# Patient Record
Sex: Female | Born: 1945 | Race: White | Hispanic: No | Marital: Single | Smoking: Current some day smoker
Health system: Southern US, Community
[De-identification: ages and names within clinical notes are randomized; demographics above are authoritative.]

## PROBLEM LIST (undated history)

## (undated) DIAGNOSIS — T4145XA Adverse effect of unspecified anesthetic, initial encounter: Secondary | ICD-10-CM

## (undated) DIAGNOSIS — J189 Pneumonia, unspecified organism: Secondary | ICD-10-CM

## (undated) DIAGNOSIS — M5416 Radiculopathy, lumbar region: Secondary | ICD-10-CM

## (undated) DIAGNOSIS — Z972 Presence of dental prosthetic device (complete) (partial): Secondary | ICD-10-CM

## (undated) DIAGNOSIS — K5190 Ulcerative colitis, unspecified, without complications: Secondary | ICD-10-CM

## (undated) DIAGNOSIS — R42 Dizziness and giddiness: Secondary | ICD-10-CM

## (undated) DIAGNOSIS — F419 Anxiety disorder, unspecified: Secondary | ICD-10-CM

## (undated) DIAGNOSIS — R7303 Prediabetes: Secondary | ICD-10-CM

## (undated) DIAGNOSIS — E871 Hypo-osmolality and hyponatremia: Secondary | ICD-10-CM

## (undated) DIAGNOSIS — M8580 Other specified disorders of bone density and structure, unspecified site: Secondary | ICD-10-CM

## (undated) DIAGNOSIS — M1990 Unspecified osteoarthritis, unspecified site: Secondary | ICD-10-CM

## (undated) DIAGNOSIS — I10 Essential (primary) hypertension: Secondary | ICD-10-CM

## (undated) DIAGNOSIS — I714 Abdominal aortic aneurysm, without rupture, unspecified: Secondary | ICD-10-CM

## (undated) DIAGNOSIS — T7840XA Allergy, unspecified, initial encounter: Secondary | ICD-10-CM

## (undated) DIAGNOSIS — F329 Major depressive disorder, single episode, unspecified: Secondary | ICD-10-CM

## (undated) DIAGNOSIS — F32A Depression, unspecified: Secondary | ICD-10-CM

## (undated) DIAGNOSIS — H269 Unspecified cataract: Secondary | ICD-10-CM

## (undated) DIAGNOSIS — E876 Hypokalemia: Secondary | ICD-10-CM

## (undated) DIAGNOSIS — T8859XA Other complications of anesthesia, initial encounter: Secondary | ICD-10-CM

## (undated) DIAGNOSIS — K52832 Lymphocytic colitis: Secondary | ICD-10-CM

## (undated) DIAGNOSIS — K259 Gastric ulcer, unspecified as acute or chronic, without hemorrhage or perforation: Secondary | ICD-10-CM

## (undated) DIAGNOSIS — G5700 Lesion of sciatic nerve, unspecified lower limb: Secondary | ICD-10-CM

## (undated) DIAGNOSIS — K279 Peptic ulcer, site unspecified, unspecified as acute or chronic, without hemorrhage or perforation: Secondary | ICD-10-CM

## (undated) HISTORY — PX: ABDOMINAL HYSTERECTOMY: SHX81

## (undated) HISTORY — DX: Major depressive disorder, single episode, unspecified: F32.9

## (undated) HISTORY — DX: Depression, unspecified: F32.A

## (undated) HISTORY — DX: Ulcerative colitis, unspecified, without complications: K51.90

## (undated) HISTORY — DX: Essential (primary) hypertension: I10

## (undated) HISTORY — DX: Gastric ulcer, unspecified as acute or chronic, without hemorrhage or perforation: K25.9

## (undated) HISTORY — DX: Unspecified osteoarthritis, unspecified site: M19.90

## (undated) HISTORY — DX: Unspecified cataract: H26.9

## (undated) HISTORY — DX: Other specified disorders of bone density and structure, unspecified site: M85.80

## (undated) HISTORY — PX: APPENDECTOMY: SHX54

## (undated) HISTORY — PX: BACK SURGERY: SHX140

## (undated) HISTORY — DX: Allergy, unspecified, initial encounter: T78.40XA

---

## 2003-10-28 HISTORY — PX: CATARACT EXTRACTION, BILATERAL: SHX1313

## 2004-01-15 ENCOUNTER — Other Ambulatory Visit: Payer: Self-pay

## 2006-11-11 ENCOUNTER — Ambulatory Visit: Payer: Self-pay

## 2010-04-08 ENCOUNTER — Ambulatory Visit: Payer: Self-pay | Admitting: Family Medicine

## 2010-04-11 ENCOUNTER — Ambulatory Visit: Payer: Self-pay | Admitting: Family Medicine

## 2010-04-17 ENCOUNTER — Ambulatory Visit: Payer: Self-pay

## 2010-05-16 LAB — PATHOLOGY REPORT

## 2010-10-27 DIAGNOSIS — K259 Gastric ulcer, unspecified as acute or chronic, without hemorrhage or perforation: Secondary | ICD-10-CM

## 2010-10-27 HISTORY — PX: BREAST BIOPSY: SHX20

## 2010-10-27 HISTORY — PX: COLONOSCOPY: SHX174

## 2010-10-27 HISTORY — DX: Gastric ulcer, unspecified as acute or chronic, without hemorrhage or perforation: K25.9

## 2010-10-29 ENCOUNTER — Ambulatory Visit: Payer: Self-pay

## 2010-11-22 ENCOUNTER — Inpatient Hospital Stay: Payer: Self-pay | Admitting: Internal Medicine

## 2011-07-29 ENCOUNTER — Ambulatory Visit: Payer: Self-pay | Admitting: Family Medicine

## 2012-04-08 ENCOUNTER — Ambulatory Visit: Payer: Self-pay | Admitting: Rheumatology

## 2012-08-02 ENCOUNTER — Ambulatory Visit: Payer: Self-pay | Admitting: Family Medicine

## 2013-03-22 ENCOUNTER — Ambulatory Visit: Payer: Self-pay | Admitting: Family Medicine

## 2013-04-28 ENCOUNTER — Ambulatory Visit: Payer: Self-pay | Admitting: Family Medicine

## 2013-08-02 ENCOUNTER — Ambulatory Visit: Payer: Self-pay | Admitting: Family Medicine

## 2013-08-08 ENCOUNTER — Ambulatory Visit: Payer: Self-pay | Admitting: Family Medicine

## 2014-03-23 DIAGNOSIS — M79673 Pain in unspecified foot: Secondary | ICD-10-CM | POA: Insufficient documentation

## 2014-03-23 DIAGNOSIS — L259 Unspecified contact dermatitis, unspecified cause: Secondary | ICD-10-CM | POA: Insufficient documentation

## 2014-03-23 DIAGNOSIS — M84376A Stress fracture, unspecified foot, initial encounter for fracture: Secondary | ICD-10-CM | POA: Insufficient documentation

## 2014-07-27 ENCOUNTER — Ambulatory Visit: Payer: Self-pay | Admitting: Family Medicine

## 2014-08-10 ENCOUNTER — Ambulatory Visit: Payer: Self-pay | Admitting: Family Medicine

## 2015-03-21 ENCOUNTER — Ambulatory Visit: Payer: Medicare Other | Attending: Pain Medicine | Admitting: Pain Medicine

## 2015-03-21 ENCOUNTER — Encounter: Payer: Self-pay | Admitting: Pain Medicine

## 2015-03-21 ENCOUNTER — Encounter (INDEPENDENT_AMBULATORY_CARE_PROVIDER_SITE_OTHER): Payer: Self-pay

## 2015-03-21 ENCOUNTER — Other Ambulatory Visit: Payer: Self-pay | Admitting: Pain Medicine

## 2015-03-21 VITALS — BP 140/73 | HR 72 | Temp 97.6°F | Resp 12 | Ht 60.0 in | Wt 134.0 lb

## 2015-03-21 DIAGNOSIS — K219 Gastro-esophageal reflux disease without esophagitis: Secondary | ICD-10-CM | POA: Insufficient documentation

## 2015-03-21 DIAGNOSIS — M51369 Other intervertebral disc degeneration, lumbar region without mention of lumbar back pain or lower extremity pain: Secondary | ICD-10-CM

## 2015-03-21 DIAGNOSIS — M5136 Other intervertebral disc degeneration, lumbar region: Secondary | ICD-10-CM | POA: Diagnosis not present

## 2015-03-21 DIAGNOSIS — M4806 Spinal stenosis, lumbar region: Secondary | ICD-10-CM | POA: Insufficient documentation

## 2015-03-21 DIAGNOSIS — M5416 Radiculopathy, lumbar region: Secondary | ICD-10-CM

## 2015-03-21 DIAGNOSIS — F418 Other specified anxiety disorders: Secondary | ICD-10-CM | POA: Diagnosis not present

## 2015-03-21 DIAGNOSIS — M5126 Other intervertebral disc displacement, lumbar region: Secondary | ICD-10-CM | POA: Diagnosis not present

## 2015-03-21 DIAGNOSIS — K8020 Calculus of gallbladder without cholecystitis without obstruction: Secondary | ICD-10-CM | POA: Insufficient documentation

## 2015-03-21 DIAGNOSIS — M545 Low back pain: Secondary | ICD-10-CM | POA: Diagnosis present

## 2015-03-21 DIAGNOSIS — M533 Sacrococcygeal disorders, not elsewhere classified: Secondary | ICD-10-CM

## 2015-03-21 DIAGNOSIS — M79605 Pain in left leg: Secondary | ICD-10-CM | POA: Diagnosis present

## 2015-03-21 DIAGNOSIS — I10 Essential (primary) hypertension: Secondary | ICD-10-CM | POA: Diagnosis not present

## 2015-03-21 DIAGNOSIS — M47816 Spondylosis without myelopathy or radiculopathy, lumbar region: Secondary | ICD-10-CM

## 2015-03-21 DIAGNOSIS — M79604 Pain in right leg: Secondary | ICD-10-CM | POA: Diagnosis present

## 2015-03-21 NOTE — Patient Instructions (Addendum)
Continue present medications.  Lumbar epidural steroid injection on Monday, 04/02/2015  F/U PCP for evaliation of  BP and general medical  condition. Please inform Dr. Posey Pronto of our plan to perform lumbar epidural steroid injection and that we will be requesting medical clearance from her for you to have lumbar epidural steroid injection  F/U surgical evaluation.  F/U neurological evaluation.  May consider radiofrequency rhizolysis or intraspinal procedures pending response to present treatment and F/U evaluation.  Patient to call Pain Management Center should patient have concerns prior to scheduled return appointment. GENERAL RISKS AND COMPLICATIONS  What are the risk, side effects and possible complications? Generally speaking, most procedures are safe.  However, with any procedure there are risks, side effects, and the possibility of complications.  The risks and complications are dependent upon the sites that are lesioned, or the type of nerve block to be performed.  The closer the procedure is to the spine, the more serious the risks are.  Great care is taken when placing the radio frequency needles, block needles or lesioning probes, but sometimes complications can occur. 1. Infection: Any time there is an injection through the skin, there is a risk of infection.  This is why sterile conditions are used for these blocks.  There are four possible types of infection. 1. Localized skin infection. 2. Central Nervous System Infection-This can be in the form of Meningitis, which can be deadly. 3. Epidural Infections-This can be in the form of an epidural abscess, which can cause pressure inside of the spine, causing compression of the spinal cord with subsequent paralysis. This would require an emergency surgery to decompress, and there are no guarantees that the patient would recover from the paralysis. 4. Discitis-This is an infection of the intervertebral discs.  It occurs in about 1% of  discography procedures.  It is difficult to treat and it may lead to surgery.        2. Pain: the needles have to go through skin and soft tissues, will cause soreness.       3. Damage to internal structures:  The nerves to be lesioned may be near blood vessels or    other nerves which can be potentially damaged.       4. Bleeding: Bleeding is more common if the patient is taking blood thinners such as  aspirin, Coumadin, Ticiid, Plavix, etc., or if he/she have some genetic predisposition  such as hemophilia. Bleeding into the spinal canal can cause compression of the spinal  cord with subsequent paralysis.  This would require an emergency surgery to  decompress and there are no guarantees that the patient would recover from the  paralysis.       5. Pneumothorax:  Puncturing of a lung is a possibility, every time a needle is introduced in  the area of the chest or upper back.  Pneumothorax refers to free air around the  collapsed lung(s), inside of the thoracic cavity (chest cavity).  Another two possible  complications related to a similar event would include: Hemothorax and Chylothorax.   These are variations of the Pneumothorax, where instead of air around the collapsed  lung(s), you may have blood or chyle, respectively.       6. Spinal headaches: They may occur with any procedures in the area of the spine.       7. Persistent CSF (Cerebro-Spinal Fluid) leakage: This is a rare problem, but may occur  with prolonged intrathecal or epidural catheters either due to the  formation of a fistulous  track or a dural tear.       8. Nerve damage: By working so close to the spinal cord, there is always a possibility of  nerve damage, which could be as serious as a permanent spinal cord injury with  paralysis.       9. Death:  Although rare, severe deadly allergic reactions known as "Anaphylactic  reaction" can occur to any of the medications used.      10. Worsening of the symptoms:  We can always make thing  worse.  What are the chances of something like this happening? Chances of any of this occuring are extremely low.  By statistics, you have more of a chance of getting killed in a motor vehicle accident: while driving to the hospital than any of the above occurring .  Nevertheless, you should be aware that they are possibilities.  In general, it is similar to taking a shower.  Everybody knows that you can slip, hit your head and get killed.  Does that mean that you should not shower again?  Nevertheless always keep in mind that statistics do not mean anything if you happen to be on the wrong side of them.  Even if a procedure has a 1 (one) in a 1,000,000 (million) chance of going wrong, it you happen to be that one..Also, keep in mind that by statistics, you have more of a chance of having something go wrong when taking medications.  Who should not have this procedure? If you are on a blood thinning medication (e.g. Coumadin, Plavix, see list of "Blood Thinners"), or if you have an active infection going on, you should not have the procedure.  If you are taking any blood thinners, please inform your physician.  How should I prepare for this procedure?  Do not eat or drink anything at least six hours prior to the procedure.  Bring a driver with you .  It cannot be a taxi.  Come accompanied by an adult that can drive you back, and that is strong enough to help you if your legs get weak or numb from the local anesthetic.  Take all of your medicines the morning of the procedure with just enough water to swallow them.  If you have diabetes, make sure that you are scheduled to have your procedure done first thing in the morning, whenever possible.  If you have diabetes, take only half of your insulin dose and notify our nurse that you have done so as soon as you arrive at the clinic.  If you are diabetic, but only take blood sugar pills (oral hypoglycemic), then do not take them on the morning of your  procedure.  You may take them after you have had the procedure.  Do not take aspirin or any aspirin-containing medications, at least eleven (11) days prior to the procedure.  They may prolong bleeding.  Wear loose fitting clothing that may be easy to take off and that you would not mind if it got stained with Betadine or blood.  Do not wear any jewelry or perfume  Remove any nail coloring.  It will interfere with some of our monitoring equipment.  NOTE: Remember that this is not meant to be interpreted as a complete list of all possible complications.  Unforeseen problems may occur.  BLOOD THINNERS The following drugs contain aspirin or other products, which can cause increased bleeding during surgery and should not be taken for 2 weeks prior to  and 1 week after surgery.  If you should need take something for relief of minor pain, you may take acetaminophen which is found in Tylenol,m Datril, Anacin-3 and Panadol. It is not blood thinner. The products listed below are.  Do not take any of the products listed below in addition to any listed on your instruction sheet.  A.P.C or A.P.C with Codeine Codeine Phosphate Capsules #3 Ibuprofen Ridaura  ABC compound Congesprin Imuran rimadil  Advil Cope Indocin Robaxisal  Alka-Seltzer Effervescent Pain Reliever and Antacid Coricidin or Coricidin-D  Indomethacin Rufen  Alka-Seltzer plus Cold Medicine Cosprin Ketoprofen S-A-C Tablets  Anacin Analgesic Tablets or Capsules Coumadin Korlgesic Salflex  Anacin Extra Strength Analgesic tablets or capsules CP-2 Tablets Lanoril Salicylate  Anaprox Cuprimine Capsules Levenox Salocol  Anexsia-D Dalteparin Magan Salsalate  Anodynos Darvon compound Magnesium Salicylate Sine-off  Ansaid Dasin Capsules Magsal Sodium Salicylate  Anturane Depen Capsules Marnal Soma  APF Arthritis pain formula Dewitt's Pills Measurin Stanback  Argesic Dia-Gesic Meclofenamic Sulfinpyrazone  Arthritis Bayer Timed Release Aspirin  Diclofenac Meclomen Sulindac  Arthritis pain formula Anacin Dicumarol Medipren Supac  Analgesic (Safety coated) Arthralgen Diffunasal Mefanamic Suprofen  Arthritis Strength Bufferin Dihydrocodeine Mepro Compound Suprol  Arthropan liquid Dopirydamole Methcarbomol with Aspirin Synalgos  ASA tablets/Enseals Disalcid Micrainin Tagament  Ascriptin Doan's Midol Talwin  Ascriptin A/D Dolene Mobidin Tanderil  Ascriptin Extra Strength Dolobid Moblgesic Ticlid  Ascriptin with Codeine Doloprin or Doloprin with Codeine Momentum Tolectin  Asperbuf Duoprin Mono-gesic Trendar  Aspergum Duradyne Motrin or Motrin IB Triminicin  Aspirin plain, buffered or enteric coated Durasal Myochrisine Trigesic  Aspirin Suppositories Easprin Nalfon Trillsate  Aspirin with Codeine Ecotrin Regular or Extra Strength Naprosyn Uracel  Atromid-S Efficin Naproxen Ursinus  Auranofin Capsules Elmiron Neocylate Vanquish  Axotal Emagrin Norgesic Verin  Azathioprine Empirin or Empirin with Codeine Normiflo Vitamin E  Azolid Emprazil Nuprin Voltaren  Bayer Aspirin plain, buffered or children's or timed BC Tablets or powders Encaprin Orgaran Warfarin Sodium  Buff-a-Comp Enoxaparin Orudis Zorpin  Buff-a-Comp with Codeine Equegesic Os-Cal-Gesic   Buffaprin Excedrin plain, buffered or Extra Strength Oxalid   Bufferin Arthritis Strength Feldene Oxphenbutazone   Bufferin plain or Extra Strength Feldene Capsules Oxycodone with Aspirin   Bufferin with Codeine Fenoprofen Fenoprofen Pabalate or Pabalate-SF   Buffets II Flogesic Panagesic   Buffinol plain or Extra Strength Florinal or Florinal with Codeine Panwarfarin   Buf-Tabs Flurbiprofen Penicillamine   Butalbital Compound Four-way cold tablets Penicillin   Butazolidin Fragmin Pepto-Bismol   Carbenicillin Geminisyn Percodan   Carna Arthritis Reliever Geopen Persantine   Carprofen Gold's salt Persistin   Chloramphenicol Goody's Phenylbutazone   Chloromycetin Haltrain Piroxlcam    Clmetidine heparin Plaquenil   Cllnoril Hyco-pap Ponstel   Clofibrate Hydroxy chloroquine Propoxyphen         Before stopping any of these medications, be sure to consult the physician who ordered them.  Some, such as Coumadin (Warfarin) are ordered to prevent or treat serious conditions such as "deep thrombosis", "pumonary embolisms", and other heart problems.  The amount of time that you may need off of the medication may also vary with the medication and the reason for which you were taking it.  If you are taking any of these medications, please make sure you notify your pain physician before you undergo any procedures.         Epidural Steroid Injection Patient Information  Description: The epidural space surrounds the nerves as they exit the spinal cord.  In some patients, the nerves can be compressed  and inflamed by a bulging disc or a tight spinal canal (spinal stenosis).  By injecting steroids into the epidural space, we can bring irritated nerves into direct contact with a potentially helpful medication.  These steroids act directly on the irritated nerves and can reduce swelling and inflammation which often leads to decreased pain.  Epidural steroids may be injected anywhere along the spine and from the neck to the low back depending upon the location of your pain.   After numbing the skin with local anesthetic (like Novocaine), a small needle is passed into the epidural space slowly.  You may experience a sensation of pressure while this is being done.  The entire block usually last less than 10 minutes.  Conditions which may be treated by epidural steroids:   Low back and leg pain  Neck and arm pain  Spinal stenosis  Post-laminectomy syndrome  Herpes zoster (shingles) pain  Pain from compression fractures  Preparation for the injection:  1. Do not eat any solid food or dairy products within 6 hours of your appointment.  2. You may drink clear liquids up to 2 hours  before appointment.  Clear liquids include water, black coffee, juice or soda.  No milk or cream please. 3. You may take your regular medication, including pain medications, with a sip of water before your appointment  Diabetics should hold regular insulin (if taken separately) and take 1/2 normal NPH dos the morning of the procedure.  Carry some sugar containing items with you to your appointment. 4. A driver must accompany you and be prepared to drive you home after your procedure.  5. Bring all your current medications with your. 6. An IV may be inserted and sedation may be given at the discretion of the physician.   7. A blood pressure cuff, EKG and other monitors will often be applied during the procedure.  Some patients may need to have extra oxygen administered for a short period. 8. You will be asked to provide medical information, including your allergies, prior to the procedure.  We must know immediately if you are taking blood thinners (like Coumadin/Warfarin)  Or if you are allergic to IV iodine contrast (dye). We must know if you could possible be pregnant.  Possible side-effects:  Bleeding from needle site  Infection (rare, may require surgery)  Nerve injury (rare)  Numbness & tingling (temporary)  Difficulty urinating (rare, temporary)  Spinal headache ( a headache worse with upright posture)  Light -headedness (temporary)  Pain at injection site (several days)  Decreased blood pressure (temporary)  Weakness in arm/leg (temporary)  Pressure sensation in back/neck (temporary)  Call if you experience:  Fever/chills associated with headache or increased back/neck pain.  Headache worsened by an upright position.  New onset weakness or numbness of an extremity below the injection site  Hives or difficulty breathing (go to the emergency room)  Inflammation or drainage at the infection site  Severe back/neck pain  Any new symptoms which are concerning to  you  Please note:  Although the local anesthetic injected can often make your back or neck feel good for several hours after the injection, the pain will likely return.  It takes 3-7 days for steroids to work in the epidural space.  You may not notice any pain relief for at least that one week.  If effective, we will often do a series of three injections spaced 3-6 weeks apart to maximally decrease your pain.  After the initial series, we generally  will wait several months before considering a repeat injection of the same type.  If you have any questions, please call [phone] [facility]
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
## 2015-03-21 NOTE — Progress Notes (Deleted)
   Subjective:    Patient ID: Cheryl [name], female    DOB: 1946-08-18, 69 y.o.   MRN: [id number]  HPI    Review of Systems     Objective:   Physical Exam        Assessment & Plan:

## 2015-03-21 NOTE — Progress Notes (Signed)
Discharged at 1400, ambulatory

## 2015-03-27 DIAGNOSIS — K52832 Lymphocytic colitis: Secondary | ICD-10-CM | POA: Insufficient documentation

## 2015-03-29 ENCOUNTER — Other Ambulatory Visit: Payer: Self-pay | Admitting: Pain Medicine

## 2015-03-30 ENCOUNTER — Other Ambulatory Visit: Payer: Self-pay | Admitting: Pain Medicine

## 2015-03-30 DIAGNOSIS — M5136 Other intervertebral disc degeneration, lumbar region: Secondary | ICD-10-CM

## 2015-03-30 DIAGNOSIS — M47816 Spondylosis without myelopathy or radiculopathy, lumbar region: Secondary | ICD-10-CM | POA: Insufficient documentation

## 2015-03-30 DIAGNOSIS — M5416 Radiculopathy, lumbar region: Secondary | ICD-10-CM

## 2015-03-30 DIAGNOSIS — M533 Sacrococcygeal disorders, not elsewhere classified: Secondary | ICD-10-CM

## 2015-04-02 ENCOUNTER — Encounter: Payer: Self-pay | Admitting: Pain Medicine

## 2015-04-02 ENCOUNTER — Ambulatory Visit: Payer: Medicare Other | Attending: Pain Medicine | Admitting: Pain Medicine

## 2015-04-02 VITALS — BP 106/62 | HR 60 | Temp 97.9°F | Resp 16 | Ht 60.0 in | Wt 134.0 lb

## 2015-04-02 DIAGNOSIS — M5126 Other intervertebral disc displacement, lumbar region: Secondary | ICD-10-CM | POA: Diagnosis not present

## 2015-04-02 DIAGNOSIS — K8020 Calculus of gallbladder without cholecystitis without obstruction: Secondary | ICD-10-CM | POA: Insufficient documentation

## 2015-04-02 DIAGNOSIS — M5416 Radiculopathy, lumbar region: Secondary | ICD-10-CM

## 2015-04-02 DIAGNOSIS — M4806 Spinal stenosis, lumbar region: Secondary | ICD-10-CM | POA: Diagnosis not present

## 2015-04-02 DIAGNOSIS — M545 Low back pain: Secondary | ICD-10-CM | POA: Diagnosis present

## 2015-04-02 DIAGNOSIS — M5136 Other intervertebral disc degeneration, lumbar region: Secondary | ICD-10-CM

## 2015-04-02 DIAGNOSIS — M47816 Spondylosis without myelopathy or radiculopathy, lumbar region: Secondary | ICD-10-CM

## 2015-04-02 DIAGNOSIS — M533 Sacrococcygeal disorders, not elsewhere classified: Secondary | ICD-10-CM

## 2015-04-02 DIAGNOSIS — M79605 Pain in left leg: Secondary | ICD-10-CM | POA: Diagnosis present

## 2015-04-02 DIAGNOSIS — M79604 Pain in right leg: Secondary | ICD-10-CM | POA: Diagnosis present

## 2015-04-02 MED ORDER — LIDOCAINE HCL (PF) 1 % IJ SOLN
INTRAMUSCULAR | Status: AC
  Filled 2015-04-02: qty 5

## 2015-04-02 MED ORDER — ORPHENADRINE CITRATE 30 MG/ML IJ SOLN
INTRAMUSCULAR | Status: AC
  Filled 2015-04-02: qty 2

## 2015-04-02 MED ORDER — SODIUM CHLORIDE 0.9 % IJ SOLN
INTRAMUSCULAR | Status: AC
  Administered 2015-04-02: 4 mL
  Filled 2015-04-02: qty 20

## 2015-04-02 MED ORDER — FENTANYL CITRATE (PF) 100 MCG/2ML IJ SOLN
INTRAMUSCULAR | Status: AC
  Administered 2015-04-02: 50 ug via INTRAVENOUS
  Filled 2015-04-02: qty 2

## 2015-04-02 MED ORDER — BUPIVACAINE HCL (PF) 0.25 % IJ SOLN
INTRAMUSCULAR | Status: AC
  Filled 2015-04-02: qty 30

## 2015-04-02 MED ORDER — TRIAMCINOLONE ACETONIDE 40 MG/ML IJ SUSP
INTRAMUSCULAR | Status: AC
  Administered 2015-04-02: 40 mg
  Filled 2015-04-02: qty 1

## 2015-04-02 MED ORDER — MIDAZOLAM HCL 5 MG/5ML IJ SOLN
INTRAMUSCULAR | Status: AC
  Administered 2015-04-02: 1 mg via INTRAVENOUS
  Filled 2015-04-02: qty 5

## 2015-04-02 NOTE — Progress Notes (Signed)
   Subjective:    Patient ID: Cheryl [name], female    DOB: Oct 10, 1946, 69 y.o.   MRN: [id number]  HPI    Review of Systems     Objective:   Physical Exam        Assessment & Plan:

## 2015-04-02 NOTE — Patient Instructions (Addendum)
Continue present medications.  F/U PCP for evaliation of  BP and general medical  condition.  F/U surgical evaluation.  F/U neurological evaluation.  May consider radiofrequency rhizolysis or intraspinal procedures pending response to present treatment and F/U evaluation.  Patient to call Pain Management Center should patient have concerns prior to scheduled return appointment. Pain Management Discharge Instructions  General Discharge Instructions :  If you need to reach your doctor call: Monday-Friday 8:00 am - 4:00 pm at [phone] or toll free [phone].  After clinic hours [phone] to have operator reach doctor.  Bring all of your medication bottles to all your appointments in the pain clinic.  To cancel or reschedule your appointment with Pain Management please remember to call 24 hours in advance to avoid a fee.  Refer to the educational materials which you have been given on: General Risks, I had my Procedure. Discharge Instructions, Post Sedation.  Post Procedure Instructions:  The drugs you were given will stay in your system until tomorrow, so for the next 24 hours you should not drive, make any legal decisions or drink any alcoholic beverages.  You may eat anything you prefer, but it is better to start with liquids then soups and crackers, and gradually work up to solid foods.  Please notify your doctor immediately if you have any unusual bleeding, trouble breathing or pain that is not related to your normal pain.  Depending on the type of procedure that was done, some parts of your body may feel week and/or numb.  This usually clears up by tonight or the next day.  Walk with the use of an assistive device or accompanied by an adult for the 24 hours.  You may use ice on the affected area for the first 24 hours.  Put ice in a Ziploc bag and cover with a towel and place against area 15 minutes on 15 minutes off.  You may switch to heat after 24 hours. 

## 2015-04-02 NOTE — Progress Notes (Signed)
Subjective:    Patient ID: Cheryl [name], female    DOB: Jun 25, 1946, 69 y.o.   MRN: [id number]  HPI  PROCEDURE PERFORMED: Lumbar epidural steroid injection   NOTE: The patient is a 69 y.o. female who returns to [place] for further evaluation and treatment of pain involving the lumbar and lower extremity region. MRI revealed the patient to be with MRI evidence of L3 for bulge with bilateral recess narrowing minimal degree L4-5 minimal circumferential bulge with small left central disc protrusion and mild facet and ligamentum flavum hypertrophy with congenitally short pedicles resulting in mild to moderate spinal stenosis left greater than the right with lateral recess stenosis. L5-S1 central disc protrusion noted and patient also with evidence of cholelithiasis I. The risks, benefits, and expectations of the procedure have been discussed and explained to the patient who was understanding and in agreement with suggested treatment plan. We will proceed with interventional treatment as discussed and explained to the patient who is willing to proceed with procedure as planned.   DESCRIPTION OF PROCEDURE: Lumbar epidural steroid injection with IV Versed, IV fentanyl conscious sedation, EKG, blood pressure, pulse, and pulse oximetry monitoring. The procedure was performed with the patient in the prone position under fluoroscopic guidance. A local anesthetic skin wheal of 1.5% plain lidocaine was accomplished at proposed entry site. An 18-gauge Tuohy epidural needle was inserted at the L 4 vertebral body level left of the midline via loss-of-resistance technique with negative heme and negative CSF return. A total of 40 mL of Preservative-Free normal saline with 40 mg of Kenalog injected incrementally via epidurally placed needle. Needle removed. The patient tolerated the injection well.   PLAN:   1. Medications: We will continue presently prescribed medications. 2. Will consider  modification of treatment regimen pending response to treatment rendered on today's visit and follow-up evaluation. 3. The patient is to follow-up with primary care physician regarding blood pressure and general medical condition status post lumbar epidural steroid injection performed on today's visit. 4. Surgical evaluation. 5. Neurological evaluation. 6. The patient may be a candidate for radiofrequency procedures, implantation device, and other treatment pending response to treatment and follow-up evaluation. 7. The patient has been advised to adhere to proper body mechanics and avoid activities which appear to aggravate condition. 8. The patient has been advised to call the [facility] prior to scheduled return appointment should there be significant change in condition or should there be significant  1. Medications: We will continue presently prescribed medications.  2. Will consider modification of treatment regimen pending response to treatment rendered on today's visit and follow-up evaluation.  3. The patient is to follow-up with primary care physician regarding blood pressure and general medical condition status post lumbar epidural steroid injection performed on today's visit.  4. Surgical evaluation.  5. Neurological evaluation. 6. The patient may be a candidate for radiofrequency procedures, implantation device, and other treatment pending response to treatment and follow-up evaluation.  7. The patient has been advised to adhere to proper body mechanics and avoid activities which appear to aggravate condition.  8. The patient has been advised to call the [facility] prior to scheduled return appointment should there be significant change in condition or should should patient have other concerns regarding condition prior to scheduled return appointment.  The patient is understanding and in agreement with suggested treatment plan.   Review of Systems      Objective:   Physical Exam  Assessment & Plan:

## 2015-04-02 NOTE — Progress Notes (Signed)
Safety precautions to be maintained throughout the outpatient stay will include: orient to surroundings, keep bed in low position, maintain call bell within reach at all times, provide assistance with transfer out of bed and ambulation.  

## 2015-04-03 ENCOUNTER — Telehealth: Payer: Self-pay | Admitting: *Deleted

## 2015-04-03 NOTE — Telephone Encounter (Signed)
Post procedure phone call, doing ok.

## 2015-05-01 ENCOUNTER — Encounter: Payer: Self-pay | Admitting: Pain Medicine

## 2015-05-01 ENCOUNTER — Ambulatory Visit: Payer: Medicare Other | Attending: Pain Medicine | Admitting: Pain Medicine

## 2015-05-01 VITALS — BP 139/72 | HR 85 | Temp 98.4°F | Resp 16 | Ht 60.0 in | Wt 137.0 lb

## 2015-05-01 DIAGNOSIS — M79605 Pain in left leg: Secondary | ICD-10-CM | POA: Diagnosis present

## 2015-05-01 DIAGNOSIS — K8020 Calculus of gallbladder without cholecystitis without obstruction: Secondary | ICD-10-CM | POA: Insufficient documentation

## 2015-05-01 DIAGNOSIS — M5126 Other intervertebral disc displacement, lumbar region: Secondary | ICD-10-CM | POA: Insufficient documentation

## 2015-05-01 DIAGNOSIS — M5416 Radiculopathy, lumbar region: Secondary | ICD-10-CM

## 2015-05-01 DIAGNOSIS — M47816 Spondylosis without myelopathy or radiculopathy, lumbar region: Secondary | ICD-10-CM

## 2015-05-01 DIAGNOSIS — M4806 Spinal stenosis, lumbar region: Secondary | ICD-10-CM | POA: Insufficient documentation

## 2015-05-01 DIAGNOSIS — M533 Sacrococcygeal disorders, not elsewhere classified: Secondary | ICD-10-CM

## 2015-05-01 DIAGNOSIS — M79604 Pain in right leg: Secondary | ICD-10-CM | POA: Diagnosis present

## 2015-05-01 DIAGNOSIS — M545 Low back pain: Secondary | ICD-10-CM | POA: Diagnosis present

## 2015-05-01 DIAGNOSIS — M5136 Other intervertebral disc degeneration, lumbar region: Secondary | ICD-10-CM

## 2015-05-01 NOTE — Progress Notes (Signed)
Safety precautions to be maintained throughout the outpatient stay will include: orient to surroundings, keep bed in low position, maintain call bell within reach at all times, provide assistance with transfer out of bed and ambulation.  

## 2015-05-01 NOTE — Patient Instructions (Addendum)
Continue present medications  Lumbar epidural steroid injection to be performed 06/27/2015  F/U PCP Dr. Posey Pronto for evaliation of  BP and general medical  condition.  F/U surgical evaluation  F/U neurological evaluation  May consider radiofrequency rhizolysis or intraspinal procedures pending response to present treatment and F/U evaluation.  Patient to call [facility] should patient have concerns prior to scheduled return appointment. GENERAL RISKS AND COMPLICATIONS  What are the risk, side effects and possible complications? Generally speaking, most procedures are safe.  However, with any procedure there are risks, side effects, and the possibility of complications.  The risks and complications are dependent upon the sites that are lesioned, or the type of nerve block to be performed.  The closer the procedure is to the spine, the more serious the risks are.  Great care is taken when placing the radio frequency needles, block needles or lesioning probes, but sometimes complications can occur. 1. Infection: Any time there is an injection through the skin, there is a risk of infection.  This is why sterile conditions are used for these blocks.  There are four possible types of infection. 1. Localized skin infection. 2. Central Nervous System Infection-This can be in the form of Meningitis, which can be deadly. 3. Epidural Infections-This can be in the form of an epidural abscess, which can cause pressure inside of the spine, causing compression of the spinal cord with subsequent paralysis. This would require an emergency surgery to decompress, and there are no guarantees that the patient would recover from the paralysis. 4. Discitis-This is an infection of the intervertebral discs.  It occurs in about 1% of discography procedures.  It is difficult to treat and it may lead to surgery.        2. Pain: the needles have to go through skin and soft tissues, will cause soreness.        3. Damage to internal structures:  The nerves to be lesioned may be near blood vessels or    other nerves which can be potentially damaged.       4. Bleeding: Bleeding is more common if the patient is taking blood thinners such as  aspirin, Coumadin, Ticiid, Plavix, etc., or if he/she have some genetic predisposition  such as hemophilia. Bleeding into the spinal canal can cause compression of the spinal  cord with subsequent paralysis.  This would require an emergency surgery to  decompress and there are no guarantees that the patient would recover from the  paralysis.       5. Pneumothorax:  Puncturing of a lung is a possibility, every time a needle is introduced in  the area of the chest or upper back.  Pneumothorax refers to free air around the  collapsed lung(s), inside of the thoracic cavity (chest cavity).  Another two possible  complications related to a similar event would include: Hemothorax and Chylothorax.   These are variations of the Pneumothorax, where instead of air around the collapsed  lung(s), you may have blood or chyle, respectively.       6. Spinal headaches: They may occur with any procedures in the area of the spine.       7. Persistent CSF (Cerebro-Spinal Fluid) leakage: This is a rare problem, but may occur  with prolonged intrathecal or epidural catheters either due to the formation of a fistulous  track or a dural tear.       8. Nerve damage: By working so close to the spinal cord, there  is always a possibility of  nerve damage, which could be as serious as a permanent spinal cord injury with  paralysis.       9. Death:  Although rare, severe deadly allergic reactions known as "Anaphylactic  reaction" can occur to any of the medications used.      10. Worsening of the symptoms:  We can always make thing worse.  What are the chances of something like this happening? Chances of any of this occuring are extremely low.  By statistics, you have more of a chance of getting killed in a  motor vehicle accident: while driving to the hospital than any of the above occurring .  Nevertheless, you should be aware that they are possibilities.  In general, it is similar to taking a shower.  Everybody knows that you can slip, hit your head and get killed.  Does that mean that you should not shower again?  Nevertheless always keep in mind that statistics do not mean anything if you happen to be on the wrong side of them.  Even if a procedure has a 1 (one) in a 1,000,000 (million) chance of going wrong, it you happen to be that one..Also, keep in mind that by statistics, you have more of a chance of having something go wrong when taking medications.  Who should not have this procedure? If you are on a blood thinning medication (e.g. Coumadin, Plavix, see list of "Blood Thinners"), or if you have an active infection going on, you should not have the procedure.  If you are taking any blood thinners, please inform your physician.  How should I prepare for this procedure?  Do not eat or drink anything at least six hours prior to the procedure.  Bring a driver with you .  It cannot be a taxi.  Come accompanied by an adult that can drive you back, and that is strong enough to help you if your legs get weak or numb from the local anesthetic.  Take all of your medicines the morning of the procedure with just enough water to swallow them.  If you have diabetes, make sure that you are scheduled to have your procedure done first thing in the morning, whenever possible.  If you have diabetes, take only half of your insulin dose and notify our nurse that you have done so as soon as you arrive at the clinic.  If you are diabetic, but only take blood sugar pills (oral hypoglycemic), then do not take them on the morning of your procedure.  You may take them after you have had the procedure.  Do not take aspirin or any aspirin-containing medications, at least eleven (11) days prior to the procedure.  They  may prolong bleeding.  Wear loose fitting clothing that may be easy to take off and that you would not mind if it got stained with Betadine or blood.  Do not wear any jewelry or perfume  Remove any nail coloring.  It will interfere with some of our monitoring equipment.  NOTE: Remember that this is not meant to be interpreted as a complete list of all possible complications.  Unforeseen problems may occur.  BLOOD THINNERS The following drugs contain aspirin or other products, which can cause increased bleeding during surgery and should not be taken for 2 weeks prior to and 1 week after surgery.  If you should need take something for relief of minor pain, you may take acetaminophen which is found in Tylenol,m Datril, Anacin-3  and Panadol. It is not blood thinner. The products listed below are.  Do not take any of the products listed below in addition to any listed on your instruction sheet.  A.P.C or A.P.C with Codeine Codeine Phosphate Capsules #3 Ibuprofen Ridaura  ABC compound Congesprin Imuran rimadil  Advil Cope Indocin Robaxisal  Alka-Seltzer Effervescent Pain Reliever and Antacid Coricidin or Coricidin-D  Indomethacin Rufen  Alka-Seltzer plus Cold Medicine Cosprin Ketoprofen S-A-C Tablets  Anacin Analgesic Tablets or Capsules Coumadin Korlgesic Salflex  Anacin Extra Strength Analgesic tablets or capsules CP-2 Tablets Lanoril Salicylate  Anaprox Cuprimine Capsules Levenox Salocol  Anexsia-D Dalteparin Magan Salsalate  Anodynos Darvon compound Magnesium Salicylate Sine-off  Ansaid Dasin Capsules Magsal Sodium Salicylate  Anturane Depen Capsules Marnal Soma  APF Arthritis pain formula Dewitt's Pills Measurin Stanback  Argesic Dia-Gesic Meclofenamic Sulfinpyrazone  Arthritis Bayer Timed Release Aspirin Diclofenac Meclomen Sulindac  Arthritis pain formula Anacin Dicumarol Medipren Supac  Analgesic (Safety coated) Arthralgen Diffunasal Mefanamic Suprofen  Arthritis Strength Bufferin  Dihydrocodeine Mepro Compound Suprol  Arthropan liquid Dopirydamole Methcarbomol with Aspirin Synalgos  ASA tablets/Enseals Disalcid Micrainin Tagament  Ascriptin Doan's Midol Talwin  Ascriptin A/D Dolene Mobidin Tanderil  Ascriptin Extra Strength Dolobid Moblgesic Ticlid  Ascriptin with Codeine Doloprin or Doloprin with Codeine Momentum Tolectin  Asperbuf Duoprin Mono-gesic Trendar  Aspergum Duradyne Motrin or Motrin IB Triminicin  Aspirin plain, buffered or enteric coated Durasal Myochrisine Trigesic  Aspirin Suppositories Easprin Nalfon Trillsate  Aspirin with Codeine Ecotrin Regular or Extra Strength Naprosyn Uracel  Atromid-S Efficin Naproxen Ursinus  Auranofin Capsules Elmiron Neocylate Vanquish  Axotal Emagrin Norgesic Verin  Azathioprine Empirin or Empirin with Codeine Normiflo Vitamin E  Azolid Emprazil Nuprin Voltaren  Bayer Aspirin plain, buffered or children's or timed BC Tablets or powders Encaprin Orgaran Warfarin Sodium  Buff-a-Comp Enoxaparin Orudis Zorpin  Buff-a-Comp with Codeine Equegesic Os-Cal-Gesic   Buffaprin Excedrin plain, buffered or Extra Strength Oxalid   Bufferin Arthritis Strength Feldene Oxphenbutazone   Bufferin plain or Extra Strength Feldene Capsules Oxycodone with Aspirin   Bufferin with Codeine Fenoprofen Fenoprofen Pabalate or Pabalate-SF   Buffets II Flogesic Panagesic   Buffinol plain or Extra Strength Florinal or Florinal with Codeine Panwarfarin   Buf-Tabs Flurbiprofen Penicillamine   Butalbital Compound Four-way cold tablets Penicillin   Butazolidin Fragmin Pepto-Bismol   Carbenicillin Geminisyn Percodan   Carna Arthritis Reliever Geopen Persantine   Carprofen Gold's salt Persistin   Chloramphenicol Goody's Phenylbutazone   Chloromycetin Haltrain Piroxlcam   Clmetidine heparin Plaquenil   Cllnoril Hyco-pap Ponstel   Clofibrate Hydroxy chloroquine Propoxyphen         Before stopping any of these medications, be sure to consult the  physician who ordered them.  Some, such as Coumadin (Warfarin) are ordered to prevent or treat serious conditions such as "deep thrombosis", "pumonary embolisms", and other heart problems.  The amount of time that you may need off of the medication may also vary with the medication and the reason for which you were taking it.  If you are taking any of these medications, please make sure you notify your pain physician before you undergo any procedures.         Epidural Steroid Injection Patient Information  Description: The epidural space surrounds the nerves as they exit the spinal cord.  In some patients, the nerves can be compressed and inflamed by a bulging disc or a tight spinal canal (spinal stenosis).  By injecting steroids into the epidural space, we can bring irritated nerves into direct  contact with a potentially helpful medication.  These steroids act directly on the irritated nerves and can reduce swelling and inflammation which often leads to decreased pain.  Epidural steroids may be injected anywhere along the spine and from the neck to the low back depending upon the location of your pain.   After numbing the skin with local anesthetic (like Novocaine), a small needle is passed into the epidural space slowly.  You may experience a sensation of pressure while this is being done.  The entire block usually last less than 10 minutes.  Conditions which may be treated by epidural steroids:   Low back and leg pain  Neck and arm pain  Spinal stenosis  Post-laminectomy syndrome  Herpes zoster (shingles) pain  Pain from compression fractures  Preparation for the injection:  1. Do not eat any solid food or dairy products within 6 hours of your appointment.  2. You may drink clear liquids up to 2 hours before appointment.  Clear liquids include water, black coffee, juice or soda.  No milk or cream please. 3. You may take your regular medication, including pain medications, with a  sip of water before your appointment  Diabetics should hold regular insulin (if taken separately) and take 1/2 normal NPH dos the morning of the procedure.  Carry some sugar containing items with you to your appointment. 4. A driver must accompany you and be prepared to drive you home after your procedure.  5. Bring all your current medications with your. 6. An IV may be inserted and sedation may be given at the discretion of the physician.   7. A blood pressure cuff, EKG and other monitors will often be applied during the procedure.  Some patients may need to have extra oxygen administered for a short period. 8. You will be asked to provide medical information, including your allergies, prior to the procedure.  We must know immediately if you are taking blood thinners (like Coumadin/Warfarin)  Or if you are allergic to IV iodine contrast (dye). We must know if you could possible be pregnant.  Possible side-effects:  Bleeding from needle site  Infection (rare, may require surgery)  Nerve injury (rare)  Numbness & tingling (temporary)  Difficulty urinating (rare, temporary)  Spinal headache ( a headache worse with upright posture)  Light -headedness (temporary)  Pain at injection site (several days)  Decreased blood pressure (temporary)  Weakness in arm/leg (temporary)  Pressure sensation in back/neck (temporary)  Call if you experience:  Fever/chills associated with headache or increased back/neck pain.  Headache worsened by an upright position.  New onset weakness or numbness of an extremity below the injection site  Hives or difficulty breathing (go to the emergency room)  Inflammation or drainage at the infection site  Severe back/neck pain  Any new symptoms which are concerning to you  Please note:  Although the local anesthetic injected can often make your back or neck feel good for several hours after the injection, the pain will likely return.  It takes 3-7  days for steroids to work in the epidural space.  You may not notice any pain relief for at least that one week.  If effective, we will often do a series of three injections spaced 3-6 weeks apart to maximally decrease your pain.  After the initial series, we generally will wait several months before considering a repeat injection of the same type.  If you have any questions, please call [phone]  Left ambulatory at 908am  with instructions no scripts [facility]

## 2015-05-01 NOTE — Progress Notes (Signed)
   Subjective:    Patient ID: Cheryl [name], female    DOB: 1946-04-11, 69 y.o.   MRN: [id number]  HPI  Patient is 69 year old female who returns to [place] for further evaluation and treatment of pain involving the lower back and lower extremity regions. Patient has significant improvement of her pain with prior procedure [facility]. We discussed patient's condition and will proceed with lumbar epidural steroid injection at time of return appointment. Patient states that she has improved ability to stand walk and that she has more strength in the lower extremities since her initial lumbar epidural steroid injection. Patient is been advised to avoid activities which appear to aggravate condition pain management prior to scheduled return appointment should there be significant change in condition. We will proceed with lumbar epidural steroid injection at time of return appointment as discussed with patient on today's visit. The patient was understanding and agreement with suggested treatment plan.     Review of Systems     Objective:   Physical Exam  There was tenderness of the splenius capitis and occipitalis musculature region. Palpation of which reproduced mild discomfort. There was unremarkable Spurling's maneuver. Patient appeared to be bilaterally equal grip strength. Tinel and Phalen's maneuver were without increase of pain significant degree. There was tenderness over the thoracic facet thoracic paraspinal musculature region palpation of which reproduced pain of moderate degree and palpation over the lumbar paraspinal musculature region reproduced pain of moderate to moderately severe degree. Straight leg raising was tolerates approximately 30 without increased pain with dorsiflexion noted. There was mild tinnitus of the greater trochanteric region iliotibial band region. No definite sensory deficit of dermatomal distribution was detected. There was  negative clonus negative Homans. DTRs difficult to elicit patient had difficulty relaxing. Abdomen was nontender and no costovertebral angle tenderness was noted.      Assessment & Plan:   Degenerative disc disease lumbar spine L3 degenerative disc disease with bulging with bilateral recess narrowing L4-L5 circumferential bulge with small left central disc protrusion and mild facet and ligamentum flavum hypertrophy with congenitally short pedicles resulting in moderate spinal stenosis left greater than right with lateral recess stenosis L5-S1 central disc protrusion noted. Patient also with cholelithiasis  Lumbar stenosis with neurogenic claudication  Lumbar facet syndrome    Plan   Continue present medications  Lumbar epidural steroid injection to be performed at time of return appointment  F/U PCP for evaliation of  BP and general medical  condition.  F/U surgical evaluation  F/U neurological evaluation  May consider radiofrequency rhizolysis or intraspinal procedures pending response to present treatment and F/U evaluation.  Patient to call [facility] should patient have concerns prior to scheduled return appointment.

## 2015-06-26 ENCOUNTER — Other Ambulatory Visit: Payer: Self-pay | Admitting: Pain Medicine

## 2015-06-27 ENCOUNTER — Ambulatory Visit: Payer: Medicare Other | Attending: Pain Medicine | Admitting: Pain Medicine

## 2015-06-27 ENCOUNTER — Encounter: Payer: Self-pay | Admitting: Pain Medicine

## 2015-06-27 VITALS — BP 123/77 | HR 74 | Temp 98.0°F | Resp 14 | Ht 60.0 in | Wt 137.0 lb

## 2015-06-27 DIAGNOSIS — M79604 Pain in right leg: Secondary | ICD-10-CM | POA: Diagnosis present

## 2015-06-27 DIAGNOSIS — M5126 Other intervertebral disc displacement, lumbar region: Secondary | ICD-10-CM | POA: Insufficient documentation

## 2015-06-27 DIAGNOSIS — M79605 Pain in left leg: Secondary | ICD-10-CM | POA: Diagnosis present

## 2015-06-27 DIAGNOSIS — M533 Sacrococcygeal disorders, not elsewhere classified: Secondary | ICD-10-CM

## 2015-06-27 DIAGNOSIS — M51369 Other intervertebral disc degeneration, lumbar region without mention of lumbar back pain or lower extremity pain: Secondary | ICD-10-CM

## 2015-06-27 DIAGNOSIS — M5416 Radiculopathy, lumbar region: Secondary | ICD-10-CM

## 2015-06-27 DIAGNOSIS — K8020 Calculus of gallbladder without cholecystitis without obstruction: Secondary | ICD-10-CM | POA: Diagnosis not present

## 2015-06-27 DIAGNOSIS — M5136 Other intervertebral disc degeneration, lumbar region: Secondary | ICD-10-CM | POA: Insufficient documentation

## 2015-06-27 DIAGNOSIS — M545 Low back pain: Secondary | ICD-10-CM | POA: Diagnosis present

## 2015-06-27 DIAGNOSIS — M47816 Spondylosis without myelopathy or radiculopathy, lumbar region: Secondary | ICD-10-CM

## 2015-06-27 MED ORDER — ORPHENADRINE CITRATE 30 MG/ML IJ SOLN
INTRAMUSCULAR | Status: AC
  Administered 2015-06-27: 30 mg
  Filled 2015-06-27: qty 2

## 2015-06-27 MED ORDER — TRIAMCINOLONE ACETONIDE 40 MG/ML IJ SUSP
INTRAMUSCULAR | Status: AC
  Administered 2015-06-27: 40 mg
  Filled 2015-06-27: qty 1

## 2015-06-27 MED ORDER — LIDOCAINE HCL (PF) 1 % IJ SOLN
INTRAMUSCULAR | Status: AC
  Administered 2015-06-27: 5 mL
  Filled 2015-06-27: qty 5

## 2015-06-27 MED ORDER — BUPIVACAINE HCL (PF) 0.25 % IJ SOLN
INTRAMUSCULAR | Status: AC
  Administered 2015-06-27: 20 mL
  Filled 2015-06-27: qty 30

## 2015-06-27 MED ORDER — FENTANYL CITRATE (PF) 100 MCG/2ML IJ SOLN
INTRAMUSCULAR | Status: AC
  Administered 2015-06-27: 50 ug via INTRAVENOUS
  Filled 2015-06-27: qty 2

## 2015-06-27 MED ORDER — MIDAZOLAM HCL 5 MG/5ML IJ SOLN
INTRAMUSCULAR | Status: AC
  Administered 2015-06-27: 3 mg via INTRAVENOUS
  Filled 2015-06-27: qty 5

## 2015-06-27 MED ORDER — SODIUM CHLORIDE 0.9 % IJ SOLN
INTRAMUSCULAR | Status: AC
  Administered 2015-06-27: 20 mL
  Filled 2015-06-27: qty 10

## 2015-06-27 NOTE — Progress Notes (Signed)
   Subjective:    Patient ID: Cheryl [name], female    DOB: [birth year], 69 y.o.   MRN: [id number]  HPI  PROCEDURE PERFORMED: Lumbar epidural steroid injection   NOTE: The patient is a 69 y.o. female who returns to [place] for further evaluation and treatment of pain involving the lumbar and lower extremity region. MRI revealed the patient to be with degenerative disc disease lumbar spine with L3 degenerative disc disease with bulging with bilateral recess narrowing L4-L5 circumferential bulge with small left central disc protrusion and mild facet and ligamentum flavum hypertrophy with congenitally short pedicles resulting in moderate spinal stenosis left greater than right with lateral recess stenosis L5-S1 central disc protrusion noted. Patient also with cholelithiasis. The risks, benefits, and expectations of the procedure have been discussed and explained to the patient who was understanding and in agreement with suggested treatment plan. We will proceed with lumbar epidural steroid injection as discussed and as explained to the patient who is willing to proceed with procedure as planned.   DESCRIPTION OF PROCEDURE: Lumbar epidural steroid injection with IV Versed, IV fentanyl conscious sedation, EKG, blood pressure, pulse, and pulse oximetry monitoring. The procedure was performed with the patient in the prone position under fluoroscopic guidance. A local anesthetic skin wheal of 1.5% plain lidocaine was accomplished at proposed entry site. An 18-gauge Tuohy epidural needle was inserted at the L 4 vertebral body level right of the midline via loss-of-resistance technique with negative heme and negative CSF return. A total of 4 mL of Preservative-Free normal saline with 40 mg of Kenalog injected incrementally via epidurally placed needle. Needle was removed.  Myoneural block injections of the lumbar paraspinal musculature region Following Betadine prep of proposed entry site 22-gauge  needle was inserted in the lumbar paraspinal musculature region and following negative aspiration 2 cc of 0.25% bupivacaine with Norflex was injected for myoneural block injection 4 in the lumbar paraspinal musculature region.    A total of 40 mg of Kenalog was utilized for the procedure.   The patient tolerated the injection well.    PLAN:   1. Medications: We will continue presently prescribed medications. 2. Will consider modification of treatment regimen pending response to treatment rendered on today's visit and follow-up evaluation. 3. The patient is to follow-up with primary care physician Dr. Posey Pronto regarding blood pressure and general medical condition status post lumbar epidural steroid injection performed on today's visit. 4. Surgical evaluation.. We will consider surgical evaluation as discussed 5. Neurological evaluation. We may consider [facility] EMG studies and other studies and follow-up evaluation 6. The patient may be a candidate for radiofrequency procedures, implantation device, and other treatment pending response to treatment and follow-up evaluation. 7. The patient has been advised to adhere to proper body mechanics and avoid activities which appear to aggravate condition. 8. The patient has been advised to call the [facility] Management Center prior to scheduled return appointment should there be significant change in condition or should there be sign  The patient is understanding and agrees with the suggested  treatment plan   Review of Systems     Objective:   Physical Exam        Assessment & Plan:

## 2015-06-27 NOTE — Patient Instructions (Addendum)
PLAN   Continue present medications  F/U PCP Dr. Posey Pronto for evaliation of  BP and general medical  condition  F/U surgical evaluation. We will consider further evaluation pending response to treatment  F/U neurological evaluation. May consider PNCV EMG studies and other studies  May consider radiofrequency rhizolysis or intraspinal procedures pending response to present treatment and F/U evaluation   Patient to call Pain Management Center should patient have concerns prior to scheduled return appointment.  Pain Management Discharge Instructions  General Discharge Instructions :  If you need to reach your doctor call: Monday-Friday 8:00 am - 4:00 pm at [phone] or toll free [phone].  After clinic hours [phone] to have operator reach doctor.  Bring all of your medication bottles to all your appointments in the pain clinic.  To cancel or reschedule your appointment with Pain Management please remember to call 24 hours in advance to avoid a fee.  Refer to the educational materials which you have been given on: General Risks, I had my Procedure. Discharge Instructions, Post Sedation.  Post Procedure Instructions:  The drugs you were given will stay in your system until tomorrow, so for the next 24 hours you should not drive, make any legal decisions or drink any alcoholic beverages.  You may eat anything you prefer, but it is better to start with liquids then soups and crackers, and gradually work up to solid foods.  Please notify your doctor immediately if you have any unusual bleeding, trouble breathing or pain that is not related to your normal pain.  Depending on the type of procedure that was done, some parts of your body may feel week and/or numb.  This usually clears up by tonight or the next day.  Walk with the use of an assistive device or accompanied by an adult for the 24 hours.  You may use ice on the affected area for the first 24 hours.  Put ice in a  Ziploc bag and cover with a towel and place against area 15 minutes on 15 minutes off.  You may switch to heat after 24 hours.

## 2015-06-27 NOTE — Progress Notes (Signed)
Safety precautions to be maintained throughout the outpatient stay will include: orient to surroundings, keep bed in low position, maintain call bell within reach at all times, provide assistance with transfer out of bed and ambulation.  

## 2015-06-28 ENCOUNTER — Telehealth: Payer: Self-pay

## 2015-06-28 NOTE — Telephone Encounter (Signed)
Patient states she is doing good.  

## 2015-07-25 ENCOUNTER — Encounter: Payer: Self-pay | Admitting: Pain Medicine

## 2015-07-25 ENCOUNTER — Ambulatory Visit: Payer: Medicare Other | Attending: Pain Medicine | Admitting: Pain Medicine

## 2015-07-25 VITALS — BP 140/97 | HR 73 | Temp 97.6°F | Resp 14 | Ht 60.0 in | Wt 137.0 lb

## 2015-07-25 DIAGNOSIS — M5416 Radiculopathy, lumbar region: Secondary | ICD-10-CM | POA: Insufficient documentation

## 2015-07-25 DIAGNOSIS — M47816 Spondylosis without myelopathy or radiculopathy, lumbar region: Secondary | ICD-10-CM

## 2015-07-25 DIAGNOSIS — M79605 Pain in left leg: Secondary | ICD-10-CM | POA: Diagnosis present

## 2015-07-25 DIAGNOSIS — M5126 Other intervertebral disc displacement, lumbar region: Secondary | ICD-10-CM | POA: Diagnosis not present

## 2015-07-25 DIAGNOSIS — M533 Sacrococcygeal disorders, not elsewhere classified: Secondary | ICD-10-CM | POA: Diagnosis not present

## 2015-07-25 DIAGNOSIS — M79604 Pain in right leg: Secondary | ICD-10-CM | POA: Diagnosis present

## 2015-07-25 DIAGNOSIS — M545 Low back pain: Secondary | ICD-10-CM | POA: Diagnosis present

## 2015-07-25 DIAGNOSIS — M4806 Spinal stenosis, lumbar region: Secondary | ICD-10-CM | POA: Insufficient documentation

## 2015-07-25 DIAGNOSIS — M5136 Other intervertebral disc degeneration, lumbar region: Secondary | ICD-10-CM

## 2015-07-25 NOTE — Patient Instructions (Addendum)
PLAN   Continue present medication  Lumbosacral selective nerve root block to be performed at time return appointment  F/U PCP Dr. Posey Pronto for evaliation of  BP and general medical  condition  F/U surgical evaluation. May consider pending follow-up evaluations  F/U neurological evaluation. May consider pending follow-up evaluations  May consider radiofrequency rhizolysis or intraspinal procedures pending response to present treatment and F/U evaluation   Patient to call Pain Management Center should patient have concerns prior to scheduled return appointment. Selective Nerve Root Block Patient Information  Description: Specific nerve roots exit the spinal canal and these nerves can be compressed and inflamed by a bulging disc and bone spurs.  By injecting steroids on the nerve root, we can potentially decrease the inflammation surrounding these nerves, which often leads to decreased pain.  Also, by injecting local anesthesia on the nerve root, this can provide [place] helpful information to give to your referring doctor if it decreases your pain.  Selective nerve root blocks can be done along the spine from the neck to the low back depending on the location of your pain.   After numbing the skin with local anesthesia, a small needle is passed to the nerve root and the position of the needle is verified using x-ray pictures.  After the needle is in correct position, we then deposit the medication.  You may experience a pressure sensation while this is being done.  The entire block usually lasts less than 15 minutes.  Conditions that may be treated with selective nerve root blocks:  Low back and leg pain  Spinal stenosis  Diagnostic block prior to potential surgery  Neck and arm pain  Post laminectomy syndrome  Preparation for the injection:  1. Do not eat any solid food or dairy products within 6 hours of your appointment. 2. You may drink clear liquids up to 2 hours before an  appointment.  Clear liquids include water, black coffee, juice or soda.  No milk or cream please. 3. You may take your regular medications, including pain medications, with a sip of water before your appointment.  Diabetics should hold regular insulin (if taken separately) and take 1/2 normal NPH dose the morning of the procedure.  Carry some sugar containing items with you to your appointment. 4. A driver must accompany you and be prepared to drive you home after your procedure. 5. Bring all your current medications with you. 6. An IV may be inserted and sedation may be given at the discretion of the physician. 7. A blood pressure cuff, EKG, and other monitors will often be applied during the procedure.  Some patients may need to have extra oxygen administered for a short period. 8. You will be asked to provide medical information, including allergies, prior to the procedure.  We must know immediately if you are taking blood  Thinners (like Coumadin) or if you are allergic to IV iodine contrast (dye).  Possible side-effects: All are usually temporary  Bleeding from needle site  Light headedness  Numbness and tingling  Decreased blood pressure  Weakness in arms/legs  Pressure sensation in back/neck  Pain at injection site (several days)  Possible complications: All are extremely rare  Infection  Nerve injury  Spinal headache (a headache wore with upright position)  Call if you experience:  Fever/chills associated with headache or increased back/neck pain  Headache worsened by an upright position  New onset weakness or numbness of an extremity below the injection site  Hives or difficulty breathing (  go to the emergency room)  Inflammation or drainage at the injection site(s)  Severe back/neck pain greater than usual  New symptoms which are concerning to you  Please note:  Although the local anesthetic injected can often make your back or neck feel good for  several hours after the injection the pain will likely return.  It takes 3-5 days for steroids to work on the nerve root. You may not notice any pain relief for at least one week.  If effective, we will often do a series of 3 injections spaced 3-6 weeks apart to maximally decrease your pain.    If you have any questions, please call [phone] [facility] Clinic[facility] RISKS AND COMPLICATIONS  What are the risk, side effects and possible complications? Generally speaking, most procedures are safe.  However, with any procedure there are risks, side effects, and the possibility of complications.  The risks and complications are dependent upon the sites that are lesioned, or the type of nerve block to be performed.  The closer the procedure is to the spine, the more serious the risks are.  Great care is taken when placing the radio frequency needles, block needles or lesioning probes, but sometimes complications can occur. 1. Infection: Any time there is an injection through the skin, there is a risk of infection.  This is why sterile conditions are used for these blocks.  There are four possible types of infection. 1. Localized skin infection. 2. Central Nervous System Infection-This can be in the form of Meningitis, which can be deadly. 3. Epidural Infections-This can be in the form of an epidural abscess, which can cause pressure inside of the spine, causing compression of the spinal cord with subsequent paralysis. This would require an emergency surgery to decompress, and there are no guarantees that the patient would recover from the paralysis. 4. Discitis-This is an infection of the intervertebral discs.  It occurs in about 1% of discography procedures.  It is difficult to treat and it may lead to surgery.        2. Pain: the needles have to go through skin and soft tissues, will cause soreness.       3. Damage to internal structures:  The nerves to be lesioned may be  near blood vessels or    other nerves which can be potentially damaged.       4. Bleeding: Bleeding is more common if the patient is taking blood thinners such as  aspirin, Coumadin, Ticiid, Plavix, etc., or if he/she have some genetic predisposition  such as hemophilia. Bleeding into the spinal canal can cause compression of the spinal  cord with subsequent paralysis.  This would require an emergency surgery to  decompress and there are no guarantees that the patient would recover from the  paralysis.       5. Pneumothorax:  Puncturing of a lung is a possibility, every time a needle is introduced in  the area of the chest or upper back.  Pneumothorax refers to free air around the  collapsed lung(s), inside of the thoracic cavity (chest cavity).  Another two possible  complications related to a similar event would include: Hemothorax and Chylothorax.   These are variations of the Pneumothorax, where instead of air around the collapsed  lung(s), you may have blood or chyle, respectively.       6. Spinal headaches: They may occur with any procedures in the area of the spine.  7. Persistent CSF (Cerebro-Spinal Fluid) leakage: This is a rare problem, but may occur  with prolonged intrathecal or epidural catheters either due to the formation of a fistulous  track or a dural tear.       8. Nerve damage: By working so close to the spinal cord, there is always a possibility of  nerve damage, which could be as serious as a permanent spinal cord injury with  paralysis.       9. Death:  Although rare, severe deadly allergic reactions known as "Anaphylactic  reaction" can occur to any of the medications used.      10. Worsening of the symptoms:  We can always make thing worse.  What are the chances of something like this happening? Chances of any of this occuring are extremely low.  By statistics, you have more of a chance of getting killed in a motor vehicle accident: while driving to the hospital than any of  the above occurring .  Nevertheless, you should be aware that they are possibilities.  In general, it is similar to taking a shower.  Everybody knows that you can slip, hit your head and get killed.  Does that mean that you should not shower again?  Nevertheless always keep in mind that statistics do not mean anything if you happen to be on the wrong side of them.  Even if a procedure has a 1 (one) in a 1,000,000 (million) chance of going wrong, it you happen to be that one..Also, keep in mind that by statistics, you have more of a chance of having something go wrong when taking medications.  Who should not have this procedure? If you are on a blood thinning medication (e.g. Coumadin, Plavix, see list of "Blood Thinners"), or if you have an active infection going on, you should not have the procedure.  If you are taking any blood thinners, please inform your physician.  How should I prepare for this procedure?  Do not eat or drink anything at least six hours prior to the procedure.  Bring a driver with you .  It cannot be a taxi.  Come accompanied by an adult that can drive you back, and that is strong enough to help you if your legs get weak or numb from the local anesthetic.  Take all of your medicines the morning of the procedure with just enough water to swallow them.  If you have diabetes, make sure that you are scheduled to have your procedure done first thing in the morning, whenever possible.  If you have diabetes, take only half of your insulin dose and notify our nurse that you have done so as soon as you arrive at the clinic.  If you are diabetic, but only take blood sugar pills (oral hypoglycemic), then do not take them on the morning of your procedure.  You may take them after you have had the procedure.  Do not take aspirin or any aspirin-containing medications, at least eleven (11) days prior to the procedure.  They may prolong bleeding.  Wear loose fitting clothing that may be  easy to take off and that you would not mind if it got stained with Betadine or blood.  Do not wear any jewelry or perfume  Remove any nail coloring.  It will interfere with some of our monitoring equipment.  NOTE: Remember that this is not meant to be interpreted as a complete list of all possible complications.  Unforeseen problems may occur.  BLOOD THINNERS  The following drugs contain aspirin or other products, which can cause increased bleeding during surgery and should not be taken for 2 weeks prior to and 1 week after surgery.  If you should need take something for relief of minor pain, you may take acetaminophen which is found in Tylenol,m Datril, Anacin-3 and Panadol. It is not blood thinner. The products listed below are.  Do not take any of the products listed below in addition to any listed on your instruction sheet.  A.P.C or A.P.C with Codeine Codeine Phosphate Capsules #3 Ibuprofen Ridaura  ABC compound Congesprin Imuran rimadil  Advil Cope Indocin Robaxisal  Alka-Seltzer Effervescent Pain Reliever and Antacid Coricidin or Coricidin-D  Indomethacin Rufen  Alka-Seltzer plus Cold Medicine Cosprin Ketoprofen S-A-C Tablets  Anacin Analgesic Tablets or Capsules Coumadin Korlgesic Salflex  Anacin Extra Strength Analgesic tablets or capsules CP-2 Tablets Lanoril Salicylate  Anaprox Cuprimine Capsules Levenox Salocol  Anexsia-D Dalteparin Magan Salsalate  Anodynos Darvon compound Magnesium Salicylate Sine-off  Ansaid Dasin Capsules Magsal Sodium Salicylate  Anturane Depen Capsules Marnal Soma  APF Arthritis pain formula Dewitt's Pills Measurin Stanback  Argesic Dia-Gesic Meclofenamic Sulfinpyrazone  Arthritis Bayer Timed Release Aspirin Diclofenac Meclomen Sulindac  Arthritis pain formula Anacin Dicumarol Medipren Supac  Analgesic (Safety coated) Arthralgen Diffunasal Mefanamic Suprofen  Arthritis Strength Bufferin Dihydrocodeine Mepro Compound Suprol  Arthropan liquid  Dopirydamole Methcarbomol with Aspirin Synalgos  ASA tablets/Enseals Disalcid Micrainin Tagament  Ascriptin Doan's Midol Talwin  Ascriptin A/D Dolene Mobidin Tanderil  Ascriptin Extra Strength Dolobid Moblgesic Ticlid  Ascriptin with Codeine Doloprin or Doloprin with Codeine Momentum Tolectin  Asperbuf Duoprin Mono-gesic Trendar  Aspergum Duradyne Motrin or Motrin IB Triminicin  Aspirin plain, buffered or enteric coated Durasal Myochrisine Trigesic  Aspirin Suppositories Easprin Nalfon Trillsate  Aspirin with Codeine Ecotrin Regular or Extra Strength Naprosyn Uracel  Atromid-S Efficin Naproxen Ursinus  Auranofin Capsules Elmiron Neocylate Vanquish  Axotal Emagrin Norgesic Verin  Azathioprine Empirin or Empirin with Codeine Normiflo Vitamin E  Azolid Emprazil Nuprin Voltaren  Bayer Aspirin plain, buffered or children's or timed BC Tablets or powders Encaprin Orgaran Warfarin Sodium  Buff-a-Comp Enoxaparin Orudis Zorpin  Buff-a-Comp with Codeine Equegesic Os-Cal-Gesic   Buffaprin Excedrin plain, buffered or Extra Strength Oxalid   Bufferin Arthritis Strength Feldene Oxphenbutazone   Bufferin plain or Extra Strength Feldene Capsules Oxycodone with Aspirin   Bufferin with Codeine Fenoprofen Fenoprofen Pabalate or Pabalate-SF   Buffets II Flogesic Panagesic   Buffinol plain or Extra Strength Florinal or Florinal with Codeine Panwarfarin   Buf-Tabs Flurbiprofen Penicillamine   Butalbital Compound Four-way cold tablets Penicillin   Butazolidin Fragmin Pepto-Bismol   Carbenicillin Geminisyn Percodan   Carna Arthritis Reliever Geopen Persantine   Carprofen Gold's salt Persistin   Chloramphenicol Goody's Phenylbutazone   Chloromycetin Haltrain Piroxlcam   Clmetidine heparin Plaquenil   Cllnoril Hyco-pap Ponstel   Clofibrate Hydroxy chloroquine Propoxyphen         Before stopping any of these medications, be sure to consult the physician who ordered them.  Some, such as Coumadin (Warfarin)  are ordered to prevent or treat serious conditions such as "deep thrombosis", "pumonary embolisms", and other heart problems.  The amount of time that you may need off of the medication may also vary with the medication and the reason for which you were taking it.  If you are taking any of these medications, please make sure you notify your pain physician before you undergo any procedures.

## 2015-07-25 NOTE — Progress Notes (Signed)
Safety precautions to be maintained throughout the outpatient stay will include: orient to surroundings, keep bed in low position, maintain call bell within reach at all times, provide assistance with transfer out of bed and ambulation.  

## 2015-07-25 NOTE — Progress Notes (Signed)
   Subjective:    Patient ID: Cheryl [name], female    DOB: 05-19-1946, 69 y.o.   MRN: [id number]  HPI Patient is 69 year old female who returns to [place] for further evaluation and treatment of lower back and lower extremity pain. Patient states she has had significant improvement of pain following previous interventional treatment in [facility]. Patient states she has significant pain at the present time radiating from the lower back to the left lower extremity. Patient states the pain becomes in increasingly intense noted in the day. We discussed patient's condition and will proceed with interventional treatment consisting of lumbar epidural steroid injection at time return appointment in attempt to decrease severity of symptoms, minimize progression of symptoms, and avoid need for more involved treatment. The patient was understanding and in agreement with suggested treatment plan.   Review of Systems     Objective:   Physical Exam  There was tenderness of the splenius capitis and occipitalis musculature region of mild degree. There was mild tenderness of the acromioclavicular and glenohumeral joint region. There was mild tenderness of the cervical facet and thoracic regions. Palpation of the thoracic paraspinal muscular region wasn't without evidence of crepitus. Tinel and Phalen's maneuver were without increased pain of significant degree. There appeared to be unremarkable Spurling's maneuver. Palpation over the lumbar paraspinal muscle region lumbar facet region wasn't increased pain of moderately severe degree on the left compared to the right. Straight leg raising was decreased on the left compared to the right. There appeared to be decreased EHL strength on the left compared to the right. Patient had difficulty attempted to stand on tiptoes and heels. Lateral bending and rotation and extension and palpation of the lumbar facets reproduce moderate to moderately  severe discomfort left greater than the right. There was mild tenderness of the PSIS and PII S regions.. There  was negative clonus negative Homans. Abdomen was soft nontender and no costovertebral angle tenderness was noted.      Assessment & Plan:  Degenerative disc disease lumbar spine L3 degenerative disc disease with bulging with bilateral recess narrowing L4-L5 circumferential bulge with small left central disc protrusion and mild facet and ligamentum flavum hypertrophy with congenitally short pedicles resulting in moderate spinal stenosis left greater than right with lateral recess stenosis L5-S1 central disc protrusion noted. Patient also with cholelithiasis  Lumbar stenosis with neurogenic claudication  Lumbar facet syndrome  Lumbar radiculopathy  Sacroiliac joint dysfunction    PLAN   Continue present medication  Lumbosacral selective nerve block to be performed at time return appointment  F/U PCP Dr. Posey Pronto for evaliation of  BP and general medical  condition  F/U surgical evaluation. May consider pending follow-up evaluations  F/U neurological evaluation. May consider pending follow-up evaluations  May consider radiofrequency rhizolysis or intraspinal procedures pending response to present treatment and F/U evaluation   Patient to call Pain Management Center should patient have concerns prior to scheduled return appointment.

## 2015-07-26 ENCOUNTER — Ambulatory Visit: Payer: Medicare Other | Admitting: Pain Medicine

## 2015-08-08 ENCOUNTER — Ambulatory Visit: Payer: Medicare Other | Attending: Pain Medicine | Admitting: Pain Medicine

## 2015-08-08 ENCOUNTER — Encounter: Payer: Self-pay | Admitting: Pain Medicine

## 2015-08-08 VITALS — BP 123/74 | HR 76 | Temp 97.6°F | Resp 18 | Ht 60.0 in | Wt 135.0 lb

## 2015-08-08 DIAGNOSIS — M5126 Other intervertebral disc displacement, lumbar region: Secondary | ICD-10-CM | POA: Diagnosis not present

## 2015-08-08 DIAGNOSIS — M4806 Spinal stenosis, lumbar region: Secondary | ICD-10-CM | POA: Insufficient documentation

## 2015-08-08 DIAGNOSIS — M545 Low back pain: Secondary | ICD-10-CM | POA: Diagnosis present

## 2015-08-08 DIAGNOSIS — M5416 Radiculopathy, lumbar region: Secondary | ICD-10-CM

## 2015-08-08 DIAGNOSIS — M47816 Spondylosis without myelopathy or radiculopathy, lumbar region: Secondary | ICD-10-CM

## 2015-08-08 DIAGNOSIS — M5136 Other intervertebral disc degeneration, lumbar region: Secondary | ICD-10-CM | POA: Insufficient documentation

## 2015-08-08 DIAGNOSIS — M533 Sacrococcygeal disorders, not elsewhere classified: Secondary | ICD-10-CM

## 2015-08-08 MED ORDER — SODIUM CHLORIDE 0.9 % IJ SOLN: 20.0000 mL | Freq: Once | INTRAMUSCULAR | Status: DC

## 2015-08-08 MED ORDER — LIDOCAINE HCL (PF) 1 % IJ SOLN: 10.0000 mL | Freq: Once | INTRAMUSCULAR | Status: DC

## 2015-08-08 MED ORDER — BUPIVACAINE HCL (PF) 0.5 % IJ SOLN: 30.0000 mL | Freq: Once | INTRAMUSCULAR | Status: DC

## 2015-08-08 MED ORDER — TRIAMCINOLONE ACETONIDE 40 MG/ML IJ SUSP
40.0000 mg | Freq: Once | INTRAMUSCULAR | Status: AC
  Administered 2015-08-08: 11:00:00

## 2015-08-08 MED ORDER — ORPHENADRINE CITRATE 30 MG/ML IJ SOLN
INTRAMUSCULAR | Status: AC
  Filled 2015-08-08: qty 2

## 2015-08-08 MED ORDER — TRIAMCINOLONE ACETONIDE 40 MG/ML IJ SUSP
INTRAMUSCULAR | Status: AC
  Filled 2015-08-08: qty 1

## 2015-08-08 MED ORDER — BUPIVACAINE HCL (PF) 0.25 % IJ SOLN
INTRAMUSCULAR | Status: AC
  Administered 2015-08-08: 11:00:00
  Filled 2015-08-08: qty 30

## 2015-08-08 MED ORDER — MIDAZOLAM HCL 5 MG/5ML IJ SOLN
5.0000 mg | Freq: Once | INTRAMUSCULAR | Status: AC
  Administered 2015-08-08: 4 mg via INTRAVENOUS

## 2015-08-08 MED ORDER — ORPHENADRINE CITRATE 30 MG/ML IJ SOLN
60.0000 mg | Freq: Once | INTRAMUSCULAR | Status: AC
  Administered 2015-08-08: 11:00:00 via INTRAMUSCULAR

## 2015-08-08 MED ORDER — MIDAZOLAM HCL 5 MG/5ML IJ SOLN
INTRAMUSCULAR | Status: AC
  Administered 2015-08-08: 4 mg via INTRAVENOUS
  Filled 2015-08-08: qty 5

## 2015-08-08 MED ORDER — FENTANYL CITRATE (PF) 100 MCG/2ML IJ SOLN
100.0000 ug | Freq: Once | INTRAMUSCULAR | Status: AC
  Administered 2015-08-08: 100 ug via INTRAVENOUS

## 2015-08-08 MED ORDER — LACTATED RINGERS IV SOLN: 1000.0000 mL | INTRAVENOUS | Status: DC

## 2015-08-08 MED ORDER — FENTANYL CITRATE (PF) 100 MCG/2ML IJ SOLN
INTRAMUSCULAR | Status: AC
  Administered 2015-08-08: 100 ug via INTRAVENOUS
  Filled 2015-08-08: qty 2

## 2015-08-08 NOTE — Progress Notes (Signed)
Safety precautions to be maintained throughout the outpatient stay will include: orient to surroundings, keep bed in low position, maintain call bell within reach at all times, provide assistance with transfer out of bed and ambulation.  

## 2015-08-08 NOTE — Patient Instructions (Addendum)
PLAN   Continue present medication  F/U PCP  Dr. Posey Pronto for evaliation of  BP and general medical  condition  F/U surgical evaluation . Surgical evaluation has been addressed  F/U neurological evaluation. May consider pending follow-up evaluations  May consider radiofrequency rhizolysis or intraspinal procedures pending response to present treatment and F/U evaluation   Patient to call Pain Management Center should patient have concerns prior to scheduled return appointment. Selective Nerve Root Block Patient Information  Description: Specific nerve roots exit the spinal canal and these nerves can be compressed and inflamed by a bulging disc and bone spurs.  By injecting steroids on the nerve root, we can potentially decrease the inflammation surrounding these nerves, which often leads to decreased pain.  Also, by injecting local anesthesia on the nerve root, this can provide [place] helpful information to give to your referring doctor if it decreases your pain.  Selective nerve root blocks can be done along the spine from the neck to the low back depending on the location of your pain.   After numbing the skin with local anesthesia, a small needle is passed to the nerve root and the position of the needle is verified using x-ray pictures.  After the needle is in correct position, we then deposit the medication.  You may experience a pressure sensation while this is being done.  The entire block usually lasts less than 15 minutes.  Conditions that may be treated with selective nerve root blocks:  Low back and leg pain  Spinal stenosis  Diagnostic block prior to potential surgery  Neck and arm pain  Post laminectomy syndrome  Preparation for the injection:  1. Do not eat any solid food or dairy products within 6 hours of your appointment. 2. You may drink clear liquids up to 2 hours before an appointment.  Clear liquids include water, black coffee, juice or soda.  No milk or cream  please. 3. You may take your regular medications, including pain medications, with a sip of water before your appointment.  Diabetics should hold regular insulin (if taken separately) and take 1/2 normal NPH dose the morning of the procedure.  Carry some sugar containing items with you to your appointment. 4. A driver must accompany you and be prepared to drive you home after your procedure. 5. Bring all your current medications with you. 6. An IV may be inserted and sedation may be given at the discretion of the physician. 7. A blood pressure cuff, EKG, and other monitors will often be applied during the procedure.  Some patients may need to have extra oxygen administered for a short period. 8. You will be asked to provide medical information, including allergies, prior to the procedure.  We must know immediately if you are taking blood  Thinners (like Coumadin) or if you are allergic to IV iodine contrast (dye).  Possible side-effects: All are usually temporary  Bleeding from needle site  Light headedness  Numbness and tingling  Decreased blood pressure  Weakness in arms/legs  Pressure sensation in back/neck  Pain at injection site (several days)  Possible complications: All are extremely rare  Infection  Nerve injury  Spinal headache (a headache wore with upright position)  Call if you experience:  Fever/chills associated with headache or increased back/neck pain  Headache worsened by an upright position  New onset weakness or numbness of an extremity below the injection site  Hives or difficulty breathing (go to the emergency room)  Inflammation or drainage at the  injection site(s)  Severe back/neck pain greater than usual  New symptoms which are concerning to you  Please note:  Although the local anesthetic injected can often make your back or neck feel good for several hours after the injection the pain will likely return.  It takes 3-5 days for steroids  to work on the nerve root. You may not notice any pain relief for at least one week.  If effective, we will often do a series of 3 injections spaced 3-6 weeks apart to maximally decrease your pain.    If you have any questions, please call [phone] [facility] Inst[facility]  General Discharge Instructions :  If you need to reach your doctor call: Monday-Friday 8:00 am - 4:00 pm at [phone] or toll free [phone].  After clinic hours [phone] to have operator reach doctor.  Bring all of your medication bottles to all your appointments in the pain clinic.  To cancel or reschedule your appointment with Pain Management please remember to call 24 hours in advance to avoid a fee.  Refer to the educational materials which you have been given on: General Risks, I had my Procedure. Discharge Instructions, Post Sedation.  Post Procedure Instructions:  The drugs you were given will stay in your system until tomorrow, so for the next 24 hours you should not drive, make any legal decisions or drink any alcoholic beverages.  You may eat anything you prefer, but it is better to start with liquids then soups and crackers, and gradually work up to solid foods.  Please notify your doctor immediately if you have any unusual bleeding, trouble breathing or pain that is not related to your normal pain.  Depending on the type of procedure that was done, some parts of your body may feel week and/or numb.  This usually clears up by tonight or the next day.  Walk with the use of an assistive device or accompanied by an adult for the 24 hours.  You may use ice on the affected area for the first 24 hours.  Put ice in a Ziploc bag and cover with a towel and place against area 15 minutes on 15 minutes off.  You may switch to heat after 24 hours.GENERAL RISKS AND COMPLICATIONS  What are the risk, side effects and possible  complications? Generally speaking, most procedures are safe.  However, with any procedure there are risks, side effects, and the possibility of complications.  The risks and complications are dependent upon the sites that are lesioned, or the type of nerve block to be performed.  The closer the procedure is to the spine, the more serious the risks are.  Great care is taken when placing the radio frequency needles, block needles or lesioning probes, but sometimes complications can occur. 1. Infection: Any time there is an injection through the skin, there is a risk of infection.  This is why sterile conditions are used for these blocks.  There are four possible types of infection. 1. Localized skin infection. 2. Central Nervous System Infection-This can be in the form of Meningitis, which can be deadly. 3. Epidural Infections-This can be in the form of an epidural abscess, which can cause pressure inside of the spine, causing compression of the spinal cord with subsequent paralysis. This would require an emergency surgery to decompress, and there are no guarantees that the patient would recover from the paralysis. 4. Discitis-This is an infection of the intervertebral discs.  It occurs in  about 1% of discography procedures.  It is difficult to treat and it may lead to surgery.        2. Pain: the needles have to go through skin and soft tissues, will cause soreness.       3. Damage to internal structures:  The nerves to be lesioned may be near blood vessels or    other nerves which can be potentially damaged.       4. Bleeding: Bleeding is more common if the patient is taking blood thinners such as  aspirin, Coumadin, Ticiid, Plavix, etc., or if he/she have some genetic predisposition  such as hemophilia. Bleeding into the spinal canal can cause compression of the spinal  cord with subsequent paralysis.  This would require an emergency surgery to  decompress and there are no guarantees that the patient  would recover from the  paralysis.       5. Pneumothorax:  Puncturing of a lung is a possibility, every time a needle is introduced in  the area of the chest or upper back.  Pneumothorax refers to free air around the  collapsed lung(s), inside of the thoracic cavity (chest cavity).  Another two possible  complications related to a similar event would include: Hemothorax and Chylothorax.   These are variations of the Pneumothorax, where instead of air around the collapsed  lung(s), you may have blood or chyle, respectively.       6. Spinal headaches: They may occur with any procedures in the area of the spine.       7. Persistent CSF (Cerebro-Spinal Fluid) leakage: This is a rare problem, but may occur  with prolonged intrathecal or epidural catheters either due to the formation of a fistulous  track or a dural tear.       8. Nerve damage: By working so close to the spinal cord, there is always a possibility of  nerve damage, which could be as serious as a permanent spinal cord injury with  paralysis.       9. Death:  Although rare, severe deadly allergic reactions known as "Anaphylactic  reaction" can occur to any of the medications used.      10. Worsening of the symptoms:  We can always make thing worse.  What are the chances of something like this happening? Chances of any of this occuring are extremely low.  By statistics, you have more of a chance of getting killed in a motor vehicle accident: while driving to the hospital than any of the above occurring .  Nevertheless, you should be aware that they are possibilities.  In general, it is similar to taking a shower.  Everybody knows that you can slip, hit your head and get killed.  Does that mean that you should not shower again?  Nevertheless always keep in mind that statistics do not mean anything if you happen to be on the wrong side of them.  Even if a procedure has a 1 (one) in a 1,000,000 (million) chance of going wrong, it you happen to be that  one..Also, keep in mind that by statistics, you have more of a chance of having something go wrong when taking medications.  Who should not have this procedure? If you are on a blood thinning medication (e.g. Coumadin, Plavix, see list of "Blood Thinners"), or if you have an active infection going on, you should not have the procedure.  If you are taking any blood thinners, please inform your physician.  How should  I prepare for this procedure?  Do not eat or drink anything at least six hours prior to the procedure.  Bring a driver with you .  It cannot be a taxi.  Come accompanied by an adult that can drive you back, and that is strong enough to help you if your legs get weak or numb from the local anesthetic.  Take all of your medicines the morning of the procedure with just enough water to swallow them.  If you have diabetes, make sure that you are scheduled to have your procedure done first thing in the morning, whenever possible.  If you have diabetes, take only half of your insulin dose and notify our nurse that you have done so as soon as you arrive at the clinic.  If you are diabetic, but only take blood sugar pills (oral hypoglycemic), then do not take them on the morning of your procedure.  You may take them after you have had the procedure.  Do not take aspirin or any aspirin-containing medications, at least eleven (11) days prior to the procedure.  They may prolong bleeding.  Wear loose fitting clothing that may be easy to take off and that you would not mind if it got stained with Betadine or blood.  Do not wear any jewelry or perfume  Remove any nail coloring.  It will interfere with some of our monitoring equipment.  NOTE: Remember that this is not meant to be interpreted as a complete list of all possible complications.  Unforeseen problems may occur.  BLOOD THINNERS The following drugs contain aspirin or other products, which can cause increased bleeding during  surgery and should not be taken for 2 weeks prior to and 1 week after surgery.  If you should need take something for relief of minor pain, you may take acetaminophen which is found in Tylenol,m Datril, Anacin-3 and Panadol. It is not blood thinner. The products listed below are.  Do not take any of the products listed below in addition to any listed on your instruction sheet.  A.P.C or A.P.C with Codeine Codeine Phosphate Capsules #3 Ibuprofen Ridaura  ABC compound Congesprin Imuran rimadil  Advil Cope Indocin Robaxisal  Alka-Seltzer Effervescent Pain Reliever and Antacid Coricidin or Coricidin-D  Indomethacin Rufen  Alka-Seltzer plus Cold Medicine Cosprin Ketoprofen S-A-C Tablets  Anacin Analgesic Tablets or Capsules Coumadin Korlgesic Salflex  Anacin Extra Strength Analgesic tablets or capsules CP-2 Tablets Lanoril Salicylate  Anaprox Cuprimine Capsules Levenox Salocol  Anexsia-D Dalteparin Magan Salsalate  Anodynos Darvon compound Magnesium Salicylate Sine-off  Ansaid Dasin Capsules Magsal Sodium Salicylate  Anturane Depen Capsules Marnal Soma  APF Arthritis pain formula Dewitt's Pills Measurin Stanback  Argesic Dia-Gesic Meclofenamic Sulfinpyrazone  Arthritis Bayer Timed Release Aspirin Diclofenac Meclomen Sulindac  Arthritis pain formula Anacin Dicumarol Medipren Supac  Analgesic (Safety coated) Arthralgen Diffunasal Mefanamic Suprofen  Arthritis Strength Bufferin Dihydrocodeine Mepro Compound Suprol  Arthropan liquid Dopirydamole Methcarbomol with Aspirin Synalgos  ASA tablets/Enseals Disalcid Micrainin Tagament  Ascriptin Doan's Midol Talwin  Ascriptin A/D Dolene Mobidin Tanderil  Ascriptin Extra Strength Dolobid Moblgesic Ticlid  Ascriptin with Codeine Doloprin or Doloprin with Codeine Momentum Tolectin  Asperbuf Duoprin Mono-gesic Trendar  Aspergum Duradyne Motrin or Motrin IB Triminicin  Aspirin plain, buffered or enteric coated Durasal Myochrisine Trigesic  Aspirin  Suppositories Easprin Nalfon Trillsate  Aspirin with Codeine Ecotrin Regular or Extra Strength Naprosyn Uracel  Atromid-S Efficin Naproxen Ursinus  Auranofin Capsules Elmiron Neocylate Vanquish  Axotal Emagrin Norgesic Verin  Azathioprine Empirin or  Empirin with Codeine Normiflo Vitamin E  Azolid Emprazil Nuprin Voltaren  Bayer Aspirin plain, buffered or children's or timed BC Tablets or powders Encaprin Orgaran Warfarin Sodium  Buff-a-Comp Enoxaparin Orudis Zorpin  Buff-a-Comp with Codeine Equegesic Os-Cal-Gesic   Buffaprin Excedrin plain, buffered or Extra Strength Oxalid   Bufferin Arthritis Strength Feldene Oxphenbutazone   Bufferin plain or Extra Strength Feldene Capsules Oxycodone with Aspirin   Bufferin with Codeine Fenoprofen Fenoprofen Pabalate or Pabalate-SF   Buffets II Flogesic Panagesic   Buffinol plain or Extra Strength Florinal or Florinal with Codeine Panwarfarin   Buf-Tabs Flurbiprofen Penicillamine   Butalbital Compound Four-way cold tablets Penicillin   Butazolidin Fragmin Pepto-Bismol   Carbenicillin Geminisyn Percodan   Carna Arthritis Reliever Geopen Persantine   Carprofen Gold's salt Persistin   Chloramphenicol Goody's Phenylbutazone   Chloromycetin Haltrain Piroxlcam   Clmetidine heparin Plaquenil   Cllnoril Hyco-pap Ponstel   Clofibrate Hydroxy chloroquine Propoxyphen         Before stopping any of these medications, be sure to consult the physician who ordered them.  Some, such as Coumadin (Warfarin) are ordered to prevent or treat serious conditions such as "deep thrombosis", "pumonary embolisms", and other heart problems.  The amount of time that you may need off of the medication may also vary with the medication and the reason for which you were taking it.  If you are taking any of these medications, please make sure you notify your pain physician before you undergo any procedures.

## 2015-08-08 NOTE — Progress Notes (Signed)
Subjective:    Patient ID: Cheryl [name], female    DOB: 1946-02-23, 69 y.o.   MRN: [id number]  HPI PROCEDURE PERFORMED: Lumbosacral selective nerve root block   NOTE: The patient is a 69 y.o. female who returns to [facility] for further evaluation and treatment of pain involving the lumbar and lower extremity region. Studies consisting of  MRI has revealed the patient to be with evidence of degenerative disc disease lumbar spine L3 degenerative disc disease with bulging with bilateral recess narrowing L4-L5 circumferential bulge with small left central disc protrusion and mild facet and ligamentum flavum hypertrophy with congenitally short pedicles resulting in moderate spinal stenosis left greater than right with lateral recess stenosis L5-S1 central disc protrusion noted. Patient also with cholelithiasis. There is concern regarding intraspinal abnormalities contributing to the patient's symptomatology. The risks, benefits, and expectations of the procedure have been explained to the patient who was understanding and in agreement with suggested treatment plan. We will proceed with interventional treatment as discussed and as explained to the patient. The patient is understanding and in agreement with suggested treatment plan.   DESCRIPTION OF PROCEDURE: Lumbosacral selective nerve root block with IV Versed, IV fentanyl conscious sedation, EKG, blood pressure, pulse, and pulse oximetry monitoring. The procedure was performed with the patient in the prone position under fluoroscopic guidance. With the patient in the prone position, Betadine prep of proposed entry site was performed. Local anesthetic skin wheal of proposed needle entry site was prepared with 1.5% plain lidocaine with AP view of the lumbosacral spine.   PROCEDURE #1: Needle placement at the  left L 2 vertebral body: A 22 -gauge needle was inserted at the inferior border of the transverse process of the vertebral body with  needle placed medial to the midline of the transverse process on AP view of the lumbosacral spine.   NEEDLE PLACEMENT AT   L3 L4 and L5  VERTEBRAL BODY LEVELS  Needle  placement was accomplished at  L3, L4, and L5  vertebral body levels on the  left side exactly as was accomplished at the  L2  vertebral body level  and utilizing the same technique and under fluoroscopic guidance.    Needle placement was then verified on lateral view at all levels with needle tip documented to be in the posterior superior quadrant of the intervertebral foramen of  L  2, L3, L4, and  L5. Following negative aspiration for heme and CSF at each level, each level was injected with 3 mL of 0.25% bupivacaine with Kenalog.   Myoneural block injections of the lumbar paraspinal musculature region  Following Betadine prep of proposed entry site a 22-gauge needle was inserted in the lumbar paraspinal musculature region and following negative aspiration 2 cc of 0.25% bupivacaine with Norflex was injected for myoneural block injection of the lumbar paraspinal musculature region 4 The patient tolerated the procedure well. A total of 10 mg of Kenalog was utilized for the procedure.     The patient tolerated the procedure well  Total of 10 mg of Kenalog was utilized for the entire procedure  PLAN:  1. Medications: Will continue presently prescribed medications. 2. The patient is to undergo follow-up evaluation with PCP  Dr. Posey Pronto for evaluation of blood pressure and general medical condition status post procedure performed on today's visit. 3. Surgical follow-up evaluation.as discussed 4. Neurological evaluation. May consider with [facility] EMG studies and other studies 5. May consider radiofrequency procedures, implantation type procedures and other treatment  pending response to treatment and follow-up evaluation. 6. The patient has been advise do adhere to proper body mechanics and avoid activities which may aggravate  condition. 7. The patient has been advised to call the Pain Management Center prior to scheduled return appointment should there be significant change in the patient's condition or should the patient have other concerns regarding condition prior to scheduled return appointment.    Review of Systems     Objective:   Physical Exam        Assessment & Plan:

## 2015-08-09 ENCOUNTER — Telehealth: Payer: Self-pay | Admitting: *Deleted

## 2015-08-09 NOTE — Telephone Encounter (Signed)
No problems post procedure. 

## 2015-08-23 ENCOUNTER — Other Ambulatory Visit: Payer: Self-pay | Admitting: Family Medicine

## 2015-08-23 ENCOUNTER — Telehealth: Payer: Self-pay | Admitting: Gastroenterology

## 2015-08-23 DIAGNOSIS — M8580 Other specified disorders of bone density and structure, unspecified site: Secondary | ICD-10-CM

## 2015-08-23 DIAGNOSIS — Z0000 Encounter for general adult medical examination without abnormal findings: Secondary | ICD-10-CM

## 2015-08-23 NOTE — Telephone Encounter (Signed)
Left a voice message for patient to call and schedule appointment for diarrhea. Histoy of lymphocytic collitis. Notes under Media

## 2015-09-06 ENCOUNTER — Ambulatory Visit: Payer: Medicare Other | Attending: Pain Medicine | Admitting: Pain Medicine

## 2015-09-06 ENCOUNTER — Encounter: Payer: Self-pay | Admitting: Pain Medicine

## 2015-09-06 VITALS — BP 103/73 | HR 87 | Temp 97.8°F | Resp 16 | Ht 60.0 in | Wt 132.0 lb

## 2015-09-06 DIAGNOSIS — M545 Low back pain: Secondary | ICD-10-CM | POA: Diagnosis present

## 2015-09-06 DIAGNOSIS — M48062 Spinal stenosis, lumbar region with neurogenic claudication: Secondary | ICD-10-CM | POA: Insufficient documentation

## 2015-09-06 DIAGNOSIS — M533 Sacrococcygeal disorders, not elsewhere classified: Secondary | ICD-10-CM | POA: Diagnosis not present

## 2015-09-06 DIAGNOSIS — M51369 Other intervertebral disc degeneration, lumbar region without mention of lumbar back pain or lower extremity pain: Secondary | ICD-10-CM

## 2015-09-06 DIAGNOSIS — M5126 Other intervertebral disc displacement, lumbar region: Secondary | ICD-10-CM | POA: Diagnosis not present

## 2015-09-06 DIAGNOSIS — M5416 Radiculopathy, lumbar region: Secondary | ICD-10-CM | POA: Diagnosis not present

## 2015-09-06 DIAGNOSIS — M4806 Spinal stenosis, lumbar region: Secondary | ICD-10-CM | POA: Insufficient documentation

## 2015-09-06 DIAGNOSIS — M47816 Spondylosis without myelopathy or radiculopathy, lumbar region: Secondary | ICD-10-CM

## 2015-09-06 DIAGNOSIS — M5136 Other intervertebral disc degeneration, lumbar region: Secondary | ICD-10-CM | POA: Diagnosis not present

## 2015-09-06 NOTE — Progress Notes (Signed)
   Subjective:    Patient ID: Cheryl [name], female    DOB: 1946/08/29, 69 y.o.   MRN: [id number]  HPI  The patient is a 69 year old female with pain involving the lower back was sterilely region. Patient states the pain is aggravated by standing or walking. Patient denies any trauma or change in events sedated him to cause change in symptomatology. Patient states that the pain is relieved by bending forward. Patient denies any changes in events of daily living and states that she had significant improvement with previous lumbar epidural steroid injection. We will proceed with lumbar epidural steroid injection to be performed at time of return appointment and consider additional modifications of treatment pending response to treatment and follow-up evaluation. We will also discuss surgical evaluation and will proceed with surgical evaluation as well. The patient was in agreement to suggested treatment plan.   Review of Systems     Objective:   Physical Exam  Palpation over the splenius capitis and talus regions reproduce mild discomfort. There was unremarkable Spurling's maneuver and patient appeared to be bilaterally equal grip strength within L) reproducing minimal discomfort. Palpation of the thoracic facet thoracic paraspinal muscles reproduces minimal discomfort. Palpation over the lumbar paraspinal muscles lumbar facet region reproduces moderate discomfort. Lateral bending and rotation extension and palpation of the lumbar facets reproduced moderately severe discomfort. Straight leg raising was tolerated to approximately 20 without increased pain with dorsiflexion noted. There was negative clonus negative Homans. DTRs were difficult to elicit patient had difficulty relaxing. There was mild tenderness to palpation over the PSIS and PII S regions. Abdomen was nontender with no costovertebral angle tenderness noted.      Assessment & Plan:    Degenerative disc disease lumbar spine L3  degenerative disc disease with bulging with bilateral recess narrowing L4-L5 circumferential bulge with small left central disc protrusion and mild facet and ligamentum flavum hypertrophy with congenitally short pedicles resulting in moderate spinal stenosis left greater than right with lateral recess stenosis L5-S1 central disc protrusion noted. Patient also with cholelithiasis  Lumbar stenosis with neurogenic claudication  Lumbar facet syndrome  Lumbar radiculopathy  Sacroiliac joint dysfunction    PLAN   Continue present medication  Lumbar epidural steroid injection to be performed at time of return appointment  F/U PCP Dr. Posey Pronto for evaliation of  BP and general medical  condition  F/U surgical evaluation. May consider pending follow-up evaluations  F/U neurological evaluation. May consider pending follow-up evaluations  May consider radiofrequency rhizolysis or intraspinal procedures pending response to present treatment and F/U evaluation   Patient is to call pain management should any change in condition prior to scheduled return appointment

## 2015-09-06 NOTE — Progress Notes (Signed)
Safety precautions to be maintained throughout the outpatient stay will include: orient to surroundings, keep bed in low position, maintain call bell within reach at all times, provide assistance with transfer out of bed and ambulation.  

## 2015-09-06 NOTE — Patient Instructions (Addendum)
PLAN   Continue present medication  Lumbar epidural steroid injection to be performed at time of return appointment  F/U PCP Dr. Posey Pronto for evaliation of  BP and general medical  condition  F/U surgical evaluation. May consider pending follow-up evaluations  F/U neurological evaluation. May consider pending follow-up evaluations  May consider radiofrequency rhizolysis or intraspinal procedures pending response to present treatment and F/U evaluation  t. Patient to call [facility] Center should patient have concerns prior to scheduled return appointmenPain Management Discharge Instructions  General Discharge Instructions :  If you need to reach your doctor call: Monday-Friday 8:00 am - 4:00 pm at [phone] or toll free [phone].  After clinic hours [phone] to have operator reach doctor.  Bring all of your medication bottles to all your appointments in the pain clinic.  To cancel or reschedule your appointment with Pain Management please remember to call 24 hours in advance to avoid a fee.  Refer to the educational materials which you have been given on: General Risks, I had my Procedure. Discharge Instructions, Post Sedation.  Post Procedure Instructions:  The drugs you were given will stay in your system until tomorrow, so for the next 24 hours you should not drive, make any legal decisions or drink any alcoholic beverages.  You may eat anything you prefer, but it is better to start with liquids then soups and crackers, and gradually work up to solid foods.  Please notify your doctor immediately if you have any unusual bleeding, trouble breathing or pain that is not related to your normal pain.  Depending on the type of procedure that was done, some parts of your body may feel week and/or numb.  This usually clears up by tonight or the next day.  Walk with the use of an assistive device or accompanied by an adult for the 24 hours.  You may use ice on the  affected area for the first 24 hours.  Put ice in a Ziploc bag and cover with a towel and place against area 15 minutes on 15 minutes off.  You may switch to heat after 24 hours.Epidural Steroid Injection An epidural steroid injection is given to relieve pain in your neck, back, or legs that is caused by the irritation or swelling of a nerve root. This procedure involves injecting a steroid and numbing medicine (anesthetic) into the epidural space. The epidural space is the space between the outer covering of your spinal cord and the bones that form your backbone (vertebra).  LET [facility] CARE PROVIDER KNOW ABOUT:   Any allergies you have.  All medicines you are taking, including vitamins, herbs, eye drops, creams, and over-the-counter medicines such as aspirin.  Previous problems you or members of your family have had with the use of anesthetics.  Any blood disorders or blood clotting disorders you have.  Previous surgeries you have had.  Medical conditions you have. RISKS AND COMPLICATIONS Generally, this is a safe procedure. However, as with any procedure, complications can occur. Possible complications of epidural steroid injection include:  Headache.  Bleeding.  Infection.  Allergic reaction to the medicines.  Damage to your nerves. The response to this procedure depends on the underlying cause of the pain and its duration. People who have long-term (chronic) pain are less likely to benefit from epidural steroids than are those people whose pain comes on strong and suddenly. BEFORE THE PROCEDURE   Ask your health care provider about changing or stopping your regular medicines. You may be  advised to stop taking blood-thinning medicines a few days before the procedure.  You may be given medicines to reduce anxiety.  Arrange for someone to take you home after the procedure. PROCEDURE   You will remain awake during the procedure. You may receive medicine to make you  relaxed.  You will be asked to lie on your stomach.  The injection site will be cleaned.  The injection site will be numbed with a medicine (local anesthetic).  A needle will be injected through your skin into the epidural space.  Your health care provider will use an X-ray machine to ensure that the steroid is delivered closest to the affected nerve. You may have minimal discomfort at this time.  Once the needle is in the right position, the local anesthetic and the steroid will be injected into the epidural space.  The needle will then be removed and a bandage will be applied to the injection site. AFTER THE PROCEDURE   You may be monitored for a short time before you go home.  You may feel weakness or numbness in your arm or leg, which disappears within hours.  You may be allowed to eat, drink, and take your regular medicine.  You may have soreness at the site of the injection.   This information is not intended to replace advice given to you by your health care provider. Make sure you discuss any questions you have with your health care provider.   Document Released: 01/20/2008 Document Revised: 06/15/2013 Document Reviewed: 04/01/2013 Elsevier Interactive Patient Education 2016 [employer]  What are the risk, side effects and possible complications? Generally speaking, most procedures are safe.  However, with any procedure there are risks, side effects, and the possibility of complications.  The risks and complications are dependent upon the sites that are lesioned, or the type of nerve block to be performed.  The closer the procedure is to the spine, the more serious the risks are.  Great care is taken when placing the radio frequency needles, block needles or lesioning probes, but sometimes complications can occur.  Infection: Any time there is an injection through the skin, there is a risk of infection.  This is why sterile conditions are  used for these blocks.  There are four possible types of infection.  Localized skin infection.  Central Nervous System Infection-This can be in the form of Meningitis, which can be deadly.  Epidural Infections-This can be in the form of an epidural abscess, which can cause pressure inside of the spine, causing compression of the spinal cord with subsequent paralysis. This would require an emergency surgery to decompress, and there are no guarantees that the patient would recover from the paralysis.  Discitis-This is an infection of the intervertebral discs.  It occurs in about 1% of discography procedures.  It is difficult to treat and it may lead to surgery.        2. Pain: the needles have to go through skin and soft tissues, will cause soreness.       3. Damage to internal structures:  The nerves to be lesioned may be near blood vessels or    other nerves which can be potentially damaged.       4. Bleeding: Bleeding is more common if the patient is taking blood thinners such as  aspirin, Coumadin, Ticiid, Plavix, etc., or if he/she have some genetic predisposition  such as hemophilia. Bleeding into the spinal canal can cause compression of  the spinal  cord with subsequent paralysis.  This would require an emergency surgery to  decompress and there are no guarantees that the patient would recover from the  paralysis.       5. Pneumothorax:  Puncturing of a lung is a possibility, every time a needle is introduced in  the area of the chest or upper back.  Pneumothorax refers to free air around the  collapsed lung(s), inside of the thoracic cavity (chest cavity).  Another two possible  complications related to a similar event would include: Hemothorax and Chylothorax.   These are variations of the Pneumothorax, where instead of air around the collapsed  lung(s), you may have blood or chyle, respectively.       6. Spinal headaches: They may occur with any procedures in the area of the spine.        7. Persistent CSF (Cerebro-Spinal Fluid) leakage: This is a rare problem, but may occur  with prolonged intrathecal or epidural catheters either due to the formation of a fistulous  track or a dural tear.       8. Nerve damage: By working so close to the spinal cord, there is always a possibility of  nerve damage, which could be as serious as a permanent spinal cord injury with  paralysis.       9. Death:  Although rare, severe deadly allergic reactions known as "Anaphylactic  reaction" can occur to any of the medications used.      10. Worsening of the symptoms:  We can always make thing worse.  What are the chances of something like this happening? Chances of any of this occuring are extremely low.  By statistics, you have more of a chance of getting killed in a motor vehicle accident: while driving to the hospital than any of the above occurring .  Nevertheless, you should be aware that they are possibilities.  In general, it is similar to taking a shower.  Everybody knows that you can slip, hit your head and get killed.  Does that mean that you should not shower again?  Nevertheless always keep in mind that statistics do not mean anything if you happen to be on the wrong side of them.  Even if a procedure has a 1 (one) in a 1,000,000 (million) chance of going wrong, it you happen to be that one..Also, keep in mind that by statistics, you have more of a chance of having something go wrong when taking medications.  Who should not have this procedure? If you are on a blood thinning medication (e.g. Coumadin, Plavix, see list of "Blood Thinners"), or if you have an active infection going on, you should not have the procedure.  If you are taking any blood thinners, please inform your physician.  How should I prepare for this procedure?  Do not eat or drink anything at least six hours prior to the procedure.  Bring a driver with you .  It cannot be a taxi.  Come accompanied by an adult that can drive  you back, and that is strong enough to help you if your legs get weak or numb from the local anesthetic.  Take all of your medicines the morning of the procedure with just enough water to swallow them.  If you have diabetes, make sure that you are scheduled to have your procedure done first thing in the morning, whenever possible.  If you have diabetes, take only half of your insulin dose and notify our  nurse that you have done so as soon as you arrive at the clinic.  If you are diabetic, but only take blood sugar pills (oral hypoglycemic), then do not take them on the morning of your procedure.  You may take them after you have had the procedure.  Do not take aspirin or any aspirin-containing medications, at least eleven (11) days prior to the procedure.  They may prolong bleeding.  Wear loose fitting clothing that may be easy to take off and that you would not mind if it got stained with Betadine or blood.  Do not wear any jewelry or perfume  Remove any nail coloring.  It will interfere with some of our monitoring equipment.  NOTE: Remember that this is not meant to be interpreted as a complete list of all possible complications.  Unforeseen problems may occur.  BLOOD THINNERS The following drugs contain aspirin or other products, which can cause increased bleeding during surgery and should not be taken for 2 weeks prior to and 1 week after surgery.  If you should need take something for relief of minor pain, you may take acetaminophen which is found in Tylenol,m Datril, Anacin-3 and Panadol. It is not blood thinner. The products listed below are.  Do not take any of the products listed below in addition to any listed on your instruction sheet.  A.P.C or A.P.C with Codeine Codeine Phosphate Capsules #3 Ibuprofen Ridaura  ABC compound Congesprin Imuran rimadil  Advil Cope Indocin Robaxisal  Alka-Seltzer Effervescent Pain Reliever and Antacid Coricidin or Coricidin-D  Indomethacin Rufen   Alka-Seltzer plus Cold Medicine Cosprin Ketoprofen S-A-C Tablets  Anacin Analgesic Tablets or Capsules Coumadin Korlgesic Salflex  Anacin Extra Strength Analgesic tablets or capsules CP-2 Tablets Lanoril Salicylate  Anaprox Cuprimine Capsules Levenox Salocol  Anexsia-D Dalteparin Magan Salsalate  Anodynos Darvon compound Magnesium Salicylate Sine-off  Ansaid Dasin Capsules Magsal Sodium Salicylate  Anturane Depen Capsules Marnal Soma  APF Arthritis pain formula Dewitt's Pills Measurin Stanback  Argesic Dia-Gesic Meclofenamic Sulfinpyrazone  Arthritis Bayer Timed Release Aspirin Diclofenac Meclomen Sulindac  Arthritis pain formula Anacin Dicumarol Medipren Supac  Analgesic (Safety coated) Arthralgen Diffunasal Mefanamic Suprofen  Arthritis Strength Bufferin Dihydrocodeine Mepro Compound Suprol  Arthropan liquid Dopirydamole Methcarbomol with Aspirin Synalgos  ASA tablets/Enseals Disalcid Micrainin Tagament  Ascriptin Doan's Midol Talwin  Ascriptin A/D Dolene Mobidin Tanderil  Ascriptin Extra Strength Dolobid Moblgesic Ticlid  Ascriptin with Codeine Doloprin or Doloprin with Codeine Momentum Tolectin  Asperbuf Duoprin Mono-gesic Trendar  Aspergum Duradyne Motrin or Motrin IB Triminicin  Aspirin plain, buffered or enteric coated Durasal Myochrisine Trigesic  Aspirin Suppositories Easprin Nalfon Trillsate  Aspirin with Codeine Ecotrin Regular or Extra Strength Naprosyn Uracel  Atromid-S Efficin Naproxen Ursinus  Auranofin Capsules Elmiron Neocylate Vanquish  Axotal Emagrin Norgesic Verin  Azathioprine Empirin or Empirin with Codeine Normiflo Vitamin E  Azolid Emprazil Nuprin Voltaren  Bayer Aspirin plain, buffered or children's or timed BC Tablets or powders Encaprin Orgaran Warfarin Sodium  Buff-a-Comp Enoxaparin Orudis Zorpin  Buff-a-Comp with Codeine Equegesic Os-Cal-Gesic   Buffaprin Excedrin plain, buffered or Extra Strength Oxalid   Bufferin Arthritis Strength Feldene  Oxphenbutazone   Bufferin plain or Extra Strength Feldene Capsules Oxycodone with Aspirin   Bufferin with Codeine Fenoprofen Fenoprofen Pabalate or Pabalate-SF   Buffets II Flogesic Panagesic   Buffinol plain or Extra Strength Florinal or Florinal with Codeine Panwarfarin   Buf-Tabs Flurbiprofen Penicillamine   Butalbital Compound Four-way cold tablets Penicillin   Butazolidin Fragmin Pepto-Bismol   Carbenicillin Geminisyn  Percodan   Carna Arthritis Reliever Geopen Persantine   Carprofen Gold's salt Persistin   Chloramphenicol Goody's Phenylbutazone   Chloromycetin Haltrain Piroxlcam   Clmetidine heparin Plaquenil   Cllnoril Hyco-pap Ponstel   Clofibrate Hydroxy chloroquine Propoxyphen         Before stopping any of these medications, be sure to consult the physician who ordered them.  Some, such as Coumadin (Warfarin) are ordered to prevent or treat serious conditions such as "deep thrombosis", "pumonary embolisms", and other heart problems.  The amount of time that you may need off of the medication may also vary with the medication and the reason for which you were taking it.  If you are taking any of these medications, please make sure you notify your pain physician before you undergo any procedures.

## 2015-09-11 ENCOUNTER — Ambulatory Visit
Admission: RE | Admit: 2015-09-11 | Discharge: 2015-09-11 | Disposition: A | Discharge status: Home / Self Care | Payer: Medicare Other | Source: Ambulatory Visit | Attending: Family Medicine | Admitting: Family Medicine

## 2015-09-11 ENCOUNTER — Other Ambulatory Visit: Payer: Self-pay | Admitting: Family Medicine

## 2015-09-11 DIAGNOSIS — Z13820 Encounter for screening for osteoporosis: Secondary | ICD-10-CM | POA: Insufficient documentation

## 2015-09-11 DIAGNOSIS — Z0000 Encounter for general adult medical examination without abnormal findings: Secondary | ICD-10-CM

## 2015-09-11 DIAGNOSIS — M8580 Other specified disorders of bone density and structure, unspecified site: Secondary | ICD-10-CM | POA: Insufficient documentation

## 2015-09-11 DIAGNOSIS — Z1231 Encounter for screening mammogram for malignant neoplasm of breast: Secondary | ICD-10-CM | POA: Insufficient documentation

## 2015-09-18 ENCOUNTER — Telehealth: Payer: Self-pay | Admitting: Gastroenterology

## 2015-09-18 NOTE — Telephone Encounter (Signed)
colonoscopy

## 2015-09-18 NOTE — Telephone Encounter (Signed)
Left message with pt's husband for her to return my call.

## 2015-09-19 NOTE — Telephone Encounter (Signed)
Patient returned your call regarding colonoscopy

## 2015-09-26 ENCOUNTER — Ambulatory Visit: Payer: Medicare Other | Admitting: Pain Medicine

## 2015-10-01 NOTE — Telephone Encounter (Signed)
Pt has been scheduled for an appt due to fecal incontinence on Wed, 10/03/15.

## 2015-10-03 ENCOUNTER — Encounter: Payer: Self-pay | Admitting: *Deleted

## 2015-10-03 ENCOUNTER — Encounter: Payer: Self-pay | Admitting: Gastroenterology

## 2015-10-03 ENCOUNTER — Ambulatory Visit (INDEPENDENT_AMBULATORY_CARE_PROVIDER_SITE_OTHER): Payer: Medicare Other | Admitting: Gastroenterology

## 2015-10-03 ENCOUNTER — Other Ambulatory Visit: Payer: Self-pay

## 2015-10-03 VITALS — BP 136/70 | HR 84 | Temp 97.7°F | Ht 60.0 in | Wt 133.0 lb

## 2015-10-03 DIAGNOSIS — R197 Diarrhea, unspecified: Secondary | ICD-10-CM | POA: Diagnosis not present

## 2015-10-04 DIAGNOSIS — I10 Essential (primary) hypertension: Secondary | ICD-10-CM | POA: Diagnosis not present

## 2015-10-04 DIAGNOSIS — Z8719 Personal history of other diseases of the digestive system: Secondary | ICD-10-CM | POA: Diagnosis not present

## 2015-10-04 DIAGNOSIS — Z823 Family history of stroke: Secondary | ICD-10-CM | POA: Diagnosis not present

## 2015-10-04 DIAGNOSIS — Z87891 Personal history of nicotine dependence: Secondary | ICD-10-CM | POA: Diagnosis not present

## 2015-10-04 DIAGNOSIS — D120 Benign neoplasm of cecum: Secondary | ICD-10-CM | POA: Diagnosis not present

## 2015-10-04 DIAGNOSIS — Z90710 Acquired absence of both cervix and uterus: Secondary | ICD-10-CM | POA: Diagnosis not present

## 2015-10-04 DIAGNOSIS — K5730 Diverticulosis of large intestine without perforation or abscess without bleeding: Secondary | ICD-10-CM | POA: Diagnosis not present

## 2015-10-04 DIAGNOSIS — Z811 Family history of alcohol abuse and dependence: Secondary | ICD-10-CM | POA: Diagnosis not present

## 2015-10-04 DIAGNOSIS — D122 Benign neoplasm of ascending colon: Secondary | ICD-10-CM | POA: Diagnosis not present

## 2015-10-04 DIAGNOSIS — Z818 Family history of other mental and behavioral disorders: Secondary | ICD-10-CM | POA: Diagnosis not present

## 2015-10-04 DIAGNOSIS — Z9841 Cataract extraction status, right eye: Secondary | ICD-10-CM | POA: Diagnosis not present

## 2015-10-04 DIAGNOSIS — R197 Diarrhea, unspecified: Secondary | ICD-10-CM | POA: Diagnosis present

## 2015-10-04 DIAGNOSIS — Z79899 Other long term (current) drug therapy: Secondary | ICD-10-CM | POA: Diagnosis not present

## 2015-10-04 DIAGNOSIS — F329 Major depressive disorder, single episode, unspecified: Secondary | ICD-10-CM | POA: Diagnosis not present

## 2015-10-04 DIAGNOSIS — Z8249 Family history of ischemic heart disease and other diseases of the circulatory system: Secondary | ICD-10-CM | POA: Diagnosis not present

## 2015-10-04 DIAGNOSIS — Z803 Family history of malignant neoplasm of breast: Secondary | ICD-10-CM | POA: Diagnosis not present

## 2015-10-04 DIAGNOSIS — Z8261 Family history of arthritis: Secondary | ICD-10-CM | POA: Diagnosis not present

## 2015-10-04 DIAGNOSIS — Z836 Family history of other diseases of the respiratory system: Secondary | ICD-10-CM | POA: Diagnosis not present

## 2015-10-04 DIAGNOSIS — Z9842 Cataract extraction status, left eye: Secondary | ICD-10-CM | POA: Diagnosis not present

## 2015-10-04 DIAGNOSIS — R42 Dizziness and giddiness: Secondary | ICD-10-CM | POA: Diagnosis not present

## 2015-10-04 DIAGNOSIS — Z825 Family history of asthma and other chronic lower respiratory diseases: Secondary | ICD-10-CM | POA: Diagnosis not present

## 2015-10-04 DIAGNOSIS — M8580 Other specified disorders of bone density and structure, unspecified site: Secondary | ICD-10-CM | POA: Diagnosis not present

## 2015-10-04 NOTE — Progress Notes (Signed)
Gastroenterology Consultation  Referring Provider:     Denton Lank, MD Primary Care Physician:  Baltazar Apo, MD Primary Gastroenterologist:  Dr. Allen Norris     Reason for Consultation:     Diarrhea        HPI:   Cheryl [name] is a 69 y.o. y/o female referred for consultation & management of  diarrhea by Dr. Baltazar Apo, MD.  This patient comes today with a report of diarrhea. The patient states that the diarrhea has caused her to soiled herself at night. It has gotten so bad that she has become incontinent at times. The patient reports that she has had a colonoscopy in the past and was found to have colitis by Dr. Phylis Bougie it. She's not sure which type of colitis it was and she believes she was treated with budesonide. She thinks she may have had ulcerative colitis. The diarrhea had improved with the medication that now is back. There is no report of any unexplained weight loss, fevers, chills, nausea or vomiting. She also denies any abdominal pain with the diarrhea.  Past Medical History  Diagnosis Date  . Hypertension   . Depression   . Allergy     seasonal  . Cataract   . Gastric ulcer 2012  . Osteopenia   . Ulcerative colitis ([place])   . Complication of anesthesia     facial swelling after hysterectomy  . Wears dentures     partial upper  . Arthritis     hips  . Vertigo     with sinus issues    Past Surgical History  Procedure Laterality Date  . Cataract extraction, bilateral Bilateral 2005  . Appendectomy    . Colonoscopy  2012  . Abdominal hysterectomy    . Breast biopsy Left     2012 negative    Prior to Admission medications   Medication Sig Start Date End Date Taking? Authorizing Provider  alendronate (FOSAMAX) 70 MG tablet Take 70 mg by mouth.   Yes Historical Provider, MD  atorvastatin (LIPITOR) 20 MG tablet Take 20 mg by mouth at bedtime.   Yes Historical Provider, MD  buPROPion (WELLBUTRIN SR) 150 MG 12 hr tablet Take 150 mg by mouth daily.    Yes Historical  Provider, MD  busPIRone (BUSPAR) 5 MG tablet Take 5 mg by mouth 2 (two) times daily.   Yes Historical Provider, MD  calcium-vitamin D (OSCAL WITH D) 250-125 MG-UNIT tablet Take 1 tablet by mouth daily.   Yes Historical Provider, MD  cetirizine (ZYRTEC) 10 MG tablet Take 10 mg by mouth daily.    Yes Historical Provider, MD  diclofenac sodium (VOLTAREN) 1 % GEL Apply 2 g topically 3 (three) times daily as needed.    Yes Historical Provider, MD  esterified estrogens (MENEST) 0.625 MG tablet Take 0.625 mg by mouth daily.    Yes Historical Provider, MD  gabapentin (NEURONTIN) 600 MG tablet Take 600 mg by mouth 2 (two) times daily. As needed   Yes Historical Provider, MD  losartan-hydrochlorothiazide (HYZAAR) 50-12.5 MG per tablet Take 1 tablet by mouth daily.    Yes Historical Provider, MD  meclizine (ANTIVERT) 25 MG tablet Take 25 mg by mouth 2 (two) times daily as needed.    Yes Historical Provider, MD  oxyCODONE-acetaminophen (PERCOCET/ROXICET) 5-325 MG tablet Take by mouth every 4 (four) hours as needed for severe pain.   Yes Historical Provider, MD  pantoprazole (PROTONIX) 40 MG tablet Take 40 mg by mouth daily.  Yes Historical Provider, MD    Family History  Problem Relation Age of Onset  . Arthritis Mother   . Diabetes Mother   . Depression Mother   . COPD Mother   . Heart disease Mother   . Hypertension Mother   . Alcohol abuse Father   . Stroke Father   . Stroke Paternal Aunt   . Breast cancer Maternal Grandmother      Social History  Substance Use Topics  . Smoking status: Former Smoker -- 40 years    Types: Cigarettes    Quit date: 03/20/2010  . Smokeless tobacco: Never Used  . Alcohol Use: 1.8 oz/week    3 Glasses of wine per week    Allergies as of 10/03/2015  . (No Known Allergies)    Review of Systems:    All systems reviewed and negative except where noted in HPI.   Physical Exam:  BP 136/70 mmHg  Pulse 84  Temp(Src) 97.7 F (36.5 C)  Ht 5' (1.524 m)  Wt  133 lb (60.328 kg)  BMI 25.97 kg/m2 No LMP recorded. Patient has had a hysterectomy. Psych:  Alert and cooperative. Normal mood and affect. General:   Alert,  Well-developed, well-nourished, pleasant and cooperative in NAD Head:  Normocephalic and atraumatic. Eyes:  Sclera clear, no icterus.   Conjunctiva pink. Ears:  Normal auditory acuity. Nose:  No deformity, discharge, or lesions. Mouth:  No deformity or lesions,oropharynx pink & moist. Neck:  Supple; no masses or thyromegaly. Lungs:  Respirations even and unlabored.  Clear throughout to auscultation.   No wheezes, crackles, or rhonchi. No acute distress. Heart:  Regular rate and rhythm; no murmurs, clicks, rubs, or gallops. Abdomen:  Normal bowel sounds.  No bruits.  Soft, non-tender and non-distended without masses, hepatosplenomegaly or hernias noted.  No guarding or rebound tenderness.  Negative Carnett sign.   Rectal:  Deferred.  Msk:  Symmetrical without gross deformities.  Good, equal movement & strength bilaterally. Pulses:  Normal pulses noted. Extremities:  No clubbing or edema.  No cyanosis. Neurologic:  Alert and oriented x3;  grossly normal neurologically. Skin:  Intact without significant lesions or rashes.  No jaundice. Lymph Nodes:  No significant cervical adenopathy. Psych:  Alert and cooperative. Normal mood and affect.  Imaging Studies: Dg Bone Density  09/11/2015  EXAM: DUAL X-RAY ABSORPTIOMETRY (DXA) FOR BONE MINERAL DENSITY IMPRESSION: Dear Dr. Posey Pronto, Your patient Cheryl [name] completed a BMD test on 09/11/2015 using the Ranger (analysis version: 14.10) manufactured by [employer]. The following summarizes the results of our evaluation. PATIENT BIOGRAPHICAL: Name: Cheryl [name] Patient ID: [id number] Birth Date: 1946-01-09 Height: 60.0 in. Gender: Female Exam Date: 09/11/2015 Weight: 130.0 lbs. Indications: Caucasian, Hysterectomy, Oophorectomy Bilateral, Glucocorticoids (Chronic) Fractures:  Toes Treatments: Calcium, Vitamin D ASSESSMENT: The BMD measured at Femur Neck Right is 0.746 g/cm2 with a T-score of -2.1. This patient is considered OSTEOPENIC according to [facility]) criteria. Site Region Measured Measured WHO Young Adult BMD Date       Age      Classification T-score AP Spine L1-L4 09/11/2015 69.2 Osteopenia -1.3 1.034 g/cm2 AP Spine L1-L4 08/02/2013 67.1 Osteopenia -1.7 0.988 g/cm2 AP Spine L1-L4 07/29/2011 65.0 Osteopenia -2.2 0.919 g/cm2 DualFemur Neck Right 09/11/2015 69.2 Osteopenia -2.1 0.746 g/cm2 DualFemur Neck Right 08/02/2013 67.1 Osteopenia -2.1 0.745 g/cm2 DualFemur Neck Right 07/29/2011 65.0 Osteopenia -2.3 0.715 g/cm2 World Health Organization [facility]) criteria for post-menopausal, Caucasian Women: Normal:  T-score at or above -1 SD Osteopenia:   T-score between -1 and -2.5 SD Osteoporosis: T-score at or below -2.5 SD RECOMMENDATIONS: [place] recommends that FDA-approved medical therapies be considered in postmenopausal women and men age 29 or older with a: 1. Hip or vertebral (clinical or morphometric) fracture. 2. T-score of < -2.5 at the spine or hip. 3. Ten-year fracture probability by FRAX of 3% or greater for hip fracture or 20% or greater for major osteoporotic fracture. All treatment decisions require clinical judgment and consideration of individual patient factors, including patient preferences, co-morbidities, previous drug use, risk factors not captured in the FRAX model (e.g. falls, vitamin D deficiency, increased bone turnover, interval significant decline in bone density) and possible under - or over-estimation of fracture risk by FRAX. All patients should ensure an adequate intake of dietary calcium (1200 mg/d) and vitamin D (800 IU daily) unless contraindicated. FOLLOW-UP: People with diagnosed cases of osteoporosis or at high risk for fracture should have regular bone mineral density tests. For patients eligible for  Medicare, routine testing is allowed once every 2 years. The testing frequency can be increased to one year for patients who have rapidly progressing disease, those who are receiving or discontinuing medical therapy to restore bone mass, or have additional risk factors. I have reviewed this report, and agree with the above findings. [facility] Radiology Dear Dr. Posey Pronto, Your patient JETTA MEER completed a FRAX assessment on 09/11/2015 using the [place] (analysis version: 14.10) manufactured by [employer]. The following summarizes the results of our evaluation. PATIENT BIOGRAPHICAL: Name: Kendre, Anstey Patient ID: [id number] Birth Date: [birth year] Height:    60.0 in. Gender:     Female    Age:        62.2       Weight:    130.0 lbs. Ethnicity:  White                            Exam Date: 09/11/2015 FRAX* RESULTS:  (version: 3.5) 10-year Probability of Fracture1 Major Osteoporotic Fracture2 Hip Fracture 19.6% 4.5% Population: [place] (Caucasian) Risk Factors: Glucocorticoids (Chronic) Based on Femur (Right) Neck BMD 1 -The 10-year probability of fracture may be lower than reported if the patient has received treatment. 2 -Major Osteoporotic Fracture: Clinical Spine, Forearm, Hip or Shoulder *FRAX is a Materials engineer of the [employer] for Metabolic Bone Disease, a [place] (WHO) [facility]. ASSESSMENT: The probability of a major osteoporotic fracture is 19.6% within the next ten years. The probability of a hip fracture is 4.5% within the next ten years. [facility]iology Electronically Signed   By: Lavonia Dana M.D.   On: 09/11/2015 14:12   Mm Screening Breast Tomo Bilateral  09/11/2015  CLINICAL DATA:  Screening. EXAM: DIGITAL SCREENING BILATERAL MAMMOGRAM WITH 3D TOMO WITH CAD COMPARISON:  Previous exam(s). ACR Breast Density Category b: There are scattered areas of fibroglandular density. FINDINGS: There are no findings suspicious  for malignancy. Images were processed with CAD. IMPRESSION: No mammographic evidence of malignancy. A result letter of this screening mammogram will be mailed directly to the patient. RECOMMENDATION: Screening mammogram in one year. (Code:SM-B-01Y) BI-RADS CATEGORY  1: Negative. Electronically Signed   By: Lajean Manes M.D.   On: 09/11/2015 13:52    Assessment and Plan:   Cheryl [name] is a 69 y.o. y/o female  For comes in today with recurrent diarrhea after  being followed by another gastroenterologist. The patient states that she was treated for colitis but never followed up after the colitis was treated. She now comes because she is having incontinence from the diarrhea. The patient will be set up for a repeat colonoscopy. The patient has been told to try to avoid milk products and she has been told that she can take Imodium for the diarrhea. The patient has been explained the plan and agrees with it.,dwr    Note: This dictation was prepared with Dragon dictation along with smaller phrase technology. Any transcriptional errors that result from this process are unintentional.

## 2015-10-05 NOTE — Discharge Instructions (Signed)

## 2015-10-08 ENCOUNTER — Other Ambulatory Visit: Payer: Self-pay

## 2015-10-11 ENCOUNTER — Other Ambulatory Visit: Payer: Self-pay | Admitting: Gastroenterology

## 2015-10-11 ENCOUNTER — Ambulatory Visit: Payer: Medicare Other | Admitting: Anesthesiology

## 2015-10-11 ENCOUNTER — Encounter
Admission: RE | Disposition: A | Discharge status: Home / Self Care | Payer: Self-pay | Source: Ambulatory Visit | Attending: Gastroenterology

## 2015-10-11 ENCOUNTER — Ambulatory Visit
Admission: RE | Admit: 2015-10-11 | Discharge: 2015-10-11 | Disposition: A | Discharge status: Home / Self Care | Payer: Medicare Other | Source: Ambulatory Visit | Attending: Gastroenterology | Admitting: Gastroenterology

## 2015-10-11 DIAGNOSIS — R197 Diarrhea, unspecified: Secondary | ICD-10-CM | POA: Insufficient documentation

## 2015-10-11 DIAGNOSIS — K529 Noninfective gastroenteritis and colitis, unspecified: Secondary | ICD-10-CM | POA: Insufficient documentation

## 2015-10-11 DIAGNOSIS — F329 Major depressive disorder, single episode, unspecified: Secondary | ICD-10-CM | POA: Insufficient documentation

## 2015-10-11 DIAGNOSIS — Z8249 Family history of ischemic heart disease and other diseases of the circulatory system: Secondary | ICD-10-CM | POA: Insufficient documentation

## 2015-10-11 DIAGNOSIS — Z9842 Cataract extraction status, left eye: Secondary | ICD-10-CM | POA: Insufficient documentation

## 2015-10-11 DIAGNOSIS — K5730 Diverticulosis of large intestine without perforation or abscess without bleeding: Secondary | ICD-10-CM | POA: Insufficient documentation

## 2015-10-11 DIAGNOSIS — Z836 Family history of other diseases of the respiratory system: Secondary | ICD-10-CM | POA: Insufficient documentation

## 2015-10-11 DIAGNOSIS — Z803 Family history of malignant neoplasm of breast: Secondary | ICD-10-CM | POA: Insufficient documentation

## 2015-10-11 DIAGNOSIS — Z8719 Personal history of other diseases of the digestive system: Secondary | ICD-10-CM | POA: Insufficient documentation

## 2015-10-11 DIAGNOSIS — D120 Benign neoplasm of cecum: Secondary | ICD-10-CM | POA: Diagnosis not present

## 2015-10-11 DIAGNOSIS — D122 Benign neoplasm of ascending colon: Secondary | ICD-10-CM | POA: Insufficient documentation

## 2015-10-11 DIAGNOSIS — Z9841 Cataract extraction status, right eye: Secondary | ICD-10-CM | POA: Insufficient documentation

## 2015-10-11 DIAGNOSIS — Z79899 Other long term (current) drug therapy: Secondary | ICD-10-CM | POA: Insufficient documentation

## 2015-10-11 DIAGNOSIS — R42 Dizziness and giddiness: Secondary | ICD-10-CM | POA: Insufficient documentation

## 2015-10-11 DIAGNOSIS — Z823 Family history of stroke: Secondary | ICD-10-CM | POA: Insufficient documentation

## 2015-10-11 DIAGNOSIS — Z811 Family history of alcohol abuse and dependence: Secondary | ICD-10-CM | POA: Insufficient documentation

## 2015-10-11 DIAGNOSIS — Z87891 Personal history of nicotine dependence: Secondary | ICD-10-CM | POA: Insufficient documentation

## 2015-10-11 DIAGNOSIS — Z818 Family history of other mental and behavioral disorders: Secondary | ICD-10-CM | POA: Insufficient documentation

## 2015-10-11 DIAGNOSIS — M8580 Other specified disorders of bone density and structure, unspecified site: Secondary | ICD-10-CM | POA: Insufficient documentation

## 2015-10-11 DIAGNOSIS — Z90710 Acquired absence of both cervix and uterus: Secondary | ICD-10-CM | POA: Insufficient documentation

## 2015-10-11 DIAGNOSIS — I10 Essential (primary) hypertension: Secondary | ICD-10-CM | POA: Insufficient documentation

## 2015-10-11 DIAGNOSIS — Z825 Family history of asthma and other chronic lower respiratory diseases: Secondary | ICD-10-CM | POA: Insufficient documentation

## 2015-10-11 DIAGNOSIS — Z8261 Family history of arthritis: Secondary | ICD-10-CM | POA: Insufficient documentation

## 2015-10-11 HISTORY — DX: Adverse effect of unspecified anesthetic, initial encounter: T41.45XA

## 2015-10-11 HISTORY — DX: Dizziness and giddiness: R42

## 2015-10-11 HISTORY — DX: Other complications of anesthesia, initial encounter: T88.59XA

## 2015-10-11 HISTORY — DX: Presence of dental prosthetic device (complete) (partial): Z97.2

## 2015-10-11 HISTORY — PX: POLYPECTOMY: SHX5525

## 2015-10-11 HISTORY — PX: COLONOSCOPY WITH PROPOFOL: SHX5780

## 2015-10-11 SURGERY — COLONOSCOPY WITH PROPOFOL
Anesthesia: Monitor Anesthesia Care | Wound class: Contaminated

## 2015-10-11 MED ORDER — STERILE WATER FOR IRRIGATION IR SOLN
Status: DC | PRN
  Administered 2015-10-11: 08:00:00

## 2015-10-11 MED ORDER — PROMETHAZINE HCL 25 MG/ML IJ SOLN: 6.2500 mg | INTRAMUSCULAR | Status: DC | PRN

## 2015-10-11 MED ORDER — LACTATED RINGERS IV SOLN
INTRAVENOUS | Status: DC
  Administered 2015-10-11 (×2): via INTRAVENOUS

## 2015-10-11 MED ORDER — OXYCODONE HCL 5 MG PO TABS: 5.0000 mg | ORAL_TABLET | Freq: Once | ORAL | Status: DC | PRN

## 2015-10-11 MED ORDER — ACETAMINOPHEN 160 MG/5ML PO SOLN: 325.0000 mg | ORAL | Status: DC | PRN

## 2015-10-11 MED ORDER — MEPERIDINE HCL 25 MG/ML IJ SOLN: 6.2500 mg | INTRAMUSCULAR | Status: DC | PRN

## 2015-10-11 MED ORDER — OXYCODONE HCL 5 MG/5ML PO SOLN: 5.0000 mg | Freq: Once | ORAL | Status: DC | PRN

## 2015-10-11 MED ORDER — LACTATED RINGERS IV SOLN: INTRAVENOUS | Status: DC

## 2015-10-11 MED ORDER — HYDROMORPHONE HCL 1 MG/ML IJ SOLN: 0.2500 mg | INTRAMUSCULAR | Status: DC | PRN

## 2015-10-11 MED ORDER — PROPOFOL 10 MG/ML IV BOLUS
INTRAVENOUS | Status: DC | PRN
  Administered 2015-10-11: 80 mg via INTRAVENOUS
  Administered 2015-10-11 (×5): 20 mg via INTRAVENOUS
  Administered 2015-10-11: 10 mg via INTRAVENOUS
  Administered 2015-10-11 (×2): 20 mg via INTRAVENOUS

## 2015-10-11 MED ORDER — LIDOCAINE HCL (CARDIAC) 20 MG/ML IV SOLN
INTRAVENOUS | Status: DC | PRN
  Administered 2015-10-11: 20 mg via INTRAVENOUS

## 2015-10-11 MED ORDER — ACETAMINOPHEN 325 MG PO TABS: 325.0000 mg | ORAL_TABLET | ORAL | Status: DC | PRN

## 2015-10-11 SURGICAL SUPPLY — 28 items
CANISTER SUCT 1200ML W/VALVE (MISCELLANEOUS) ×4 IMPLANT
FCP ESCP3.2XJMB 240X2.8X (MISCELLANEOUS)
FORCEPS BIOP RAD 4 LRG CAP 4 (CUTTING FORCEPS) ×4 IMPLANT
FORCEPS BIOP RJ4 240 W/NDL (MISCELLANEOUS)
FORCEPS ESCP3.2XJMB 240X2.8X (MISCELLANEOUS) IMPLANT
GOWN CVR UNV OPN BCK APRN NK (MISCELLANEOUS) ×4 IMPLANT
GOWN ISOL THUMB LOOP REG UNIV (MISCELLANEOUS) ×4
HEMOCLIP INSTINCT (CLIP) IMPLANT
INJECTOR VARIJECT VIN23 (MISCELLANEOUS) IMPLANT
KIT CO2 TUBING (TUBING) IMPLANT
KIT DEFENDO VALVE AND CONN (KITS) IMPLANT
KIT ENDO PROCEDURE OLY (KITS) ×4 IMPLANT
LIGATOR MULTIBAND 6SHOOTER MBL (MISCELLANEOUS) IMPLANT
MARKER SPOT ENDO TATTOO 5ML (MISCELLANEOUS) IMPLANT
PAD GROUND ADULT SPLIT (MISCELLANEOUS) IMPLANT
SNARE SHORT THROW 13M SML OVAL (MISCELLANEOUS) ×4 IMPLANT
SNARE SHORT THROW 30M LRG OVAL (MISCELLANEOUS) IMPLANT
SPOT EX ENDOSCOPIC TATTOO (MISCELLANEOUS)
SUCTION POLY TRAP 4CHAMBER (MISCELLANEOUS) IMPLANT
TRAP SUCTION POLY (MISCELLANEOUS) ×8 IMPLANT
TUBING CONN 6MMX3.1M (TUBING)
TUBING SUCTION CONN 0.25 STRL (TUBING) IMPLANT
UNDERPAD 30X60 958B10 (PK) (MISCELLANEOUS) IMPLANT
VALVE BIOPSY ENDO (VALVE) IMPLANT
VARIJECT INJECTOR VIN23 (MISCELLANEOUS)
WATER AUXILLARY (MISCELLANEOUS) IMPLANT
WATER STERILE IRR 250ML POUR (IV SOLUTION) ×4 IMPLANT
WATER STERILE IRR 500ML POUR (IV SOLUTION) IMPLANT

## 2015-10-11 NOTE — Anesthesia Procedure Notes (Signed)
Procedure Name: MAC Performed by: ,  Pre-anesthesia Checklist: Patient identified, Emergency Drugs available, Suction available, Patient being monitored and Timeout performed Patient Re-evaluated:Patient Re-evaluated prior to inductionOxygen Delivery Method: Nasal cannula       

## 2015-10-11 NOTE — Op Note (Signed)
[facility] Gastroenterology Patient Name: Cheryl [name] Procedure Date: 10/11/2015 7:57 AM MRN: [id number] Account #: [id number] Date of Birth: Aug 07, 1946 Admit Type: Outpatient Age: 69 Room: [facility] OR ROOM 01 Gender: Female Note Status: Finalized Procedure:         Colonoscopy Indications:       Chronic diarrhea Providers:         Lucilla Lame, MD Referring MD:      Denton Lank, MD (Referring MD) Medicines:         Propofol per Anesthesia Complications:     No immediate complications. Procedure:         Pre-Anesthesia Assessment:                    - Prior to the procedure, a History and Physical was                     performed, and patient medications and allergies were                     reviewed. The patient's tolerance of previous anesthesia                     was also reviewed. The risks and benefits of the procedure                     and the sedation options and risks were discussed with the                     patient. All questions were answered, and informed consent                     was obtained. Prior Anticoagulants: The patient has taken                     no previous anticoagulant or antiplatelet agents. ASA                     Grade Assessment: II - A patient with mild systemic                     disease. After reviewing the risks and benefits, the                     patient was deemed in satisfactory condition to undergo                     the procedure.                    After obtaining informed consent, the colonoscope was                     passed under direct vision. Throughout the procedure, the                     patient's blood pressure, pulse, and oxygen saturations                     were monitored continuously. The Olympus CF H180AL                     colonoscope (S#: [id number]) was introduced through the anus  and advanced to the the terminal ileum. The colonoscopy                     was performed  without difficulty. The patient tolerated                     the procedure well. The quality of the bowel preparation                     was excellent. Findings:      The perianal and digital rectal examinations were normal.      A 8 mm polyp was found in the ascending colon. The polyp was sessile.       The polyp was removed with a cold snare. Resection and retrieval were       complete.      A 3 mm polyp was found in the cecum. The polyp was sessile. The polyp       was removed with a cold snare. Resection was complete, but the polyp       tissue was not retrieved.      Multiple small-mouthed diverticula were found in the entire colon.      A scattered area of mildly erythematous mucosa was found in the sigmoid       colon.      Random biopsies were obtained with cold forceps for histology randomly       in the entire colon and in the terminal ileum. Impression:        - One 8 mm polyp in the ascending colon. Resected and                     retrieved.                    - One 3 mm polyp in the cecum. Complete resection. Polyp                     tissue not retrieved.                    - Diverticulosis in the entire examined colon.                    - Erythematous mucosa in the sigmoid colon.                    - Random biopsies were obtained in the entire colon and in                     the terminal ileum. Recommendation:    - Await pathology results. Procedure Code(s): --- Professional ---                    [phone], Colonoscopy, flexible; with removal of tumor(s),                     polyp(s), or other lesion(s) by snare technique                    45380, 58, Colonoscopy, flexible; with biopsy, single or                     multiple Diagnosis Code(s): --- Professional ---                    K52.9, Noninfective  gastroenteritis and colitis,                     unspecified                    D12.2, Benign neoplasm of ascending colon                    D12.0, Benign neoplasm of  cecum                    K63.89, Other specified diseases of intestine CPT copyright 2014 American Medical Association. All rights reserved. The codes documented in this report are preliminary and upon coder review may  be revised to meet current compliance requirements. Lucilla Lame, MD 10/11/2015 8:28:51 AM This report has been signed electronically. Number of Addenda: 0 Note Initiated On: 10/11/2015 7:57 AM Scope Withdrawal Time: 0 hours 10 minutes 10 seconds  Total Procedure Duration: 0 hours 14 minutes 3 seconds       [facility]

## 2015-10-11 NOTE — Transfer of Care (Signed)
Immediate Anesthesia Transfer of Care Note  Patient: Cheryl [name]  Procedure(s) Performed: Procedure(s): COLONOSCOPY WITH PROPOFOL (N/A)  Patient Location: PACU  Anesthesia Type: MAC  Level of Consciousness: awake, alert  and patient cooperative  Airway and Oxygen Therapy: Patient Spontanous Breathing and Patient connected to supplemental oxygen  Post-op Assessment: Post-op Vital signs reviewed, Patient's Cardiovascular Status Stable, Respiratory Function Stable, Patent Airway and No signs of Nausea or vomiting  Post-op Vital Signs: Reviewed and stable  Complications: No apparent anesthesia complications

## 2015-10-11 NOTE — Anesthesia Postprocedure Evaluation (Signed)
Anesthesia Post Note  Patient: Cheryl [name]  Procedure(s) Performed: Procedure(s) (LRB): COLONOSCOPY WITH PROPOFOL (N/A) POLYPECTOMY  Patient location during evaluation: PACU Anesthesia Type: MAC Level of consciousness: awake and alert and oriented Pain management: satisfactory to patient Vital Signs Assessment: post-procedure vital signs reviewed and stable Respiratory status: spontaneous breathing, nonlabored ventilation and respiratory function stable Cardiovascular status: blood pressure returned to baseline and stable Postop Assessment: Adequate PO intake and No signs of nausea or vomiting Anesthetic complications: no    Raliegh Ip

## 2015-10-11 NOTE — Anesthesia Preprocedure Evaluation (Signed)
Anesthesia Evaluation  Patient identified by MRN, date of birth, ID band  Reviewed: Allergy & Precautions, H&P , NPO status , Patient's Chart, lab work & pertinent test results  Airway Mallampati: II  TM Distance: >3 FB Neck ROM: full    Dental no notable dental hx.    Pulmonary former smoker,    Pulmonary exam normal        Cardiovascular hypertension,  Rhythm:regular Rate:Normal     Neuro/Psych PSYCHIATRIC DISORDERS    GI/Hepatic PUD,   Endo/Other    Renal/GU      Musculoskeletal   Abdominal   Peds  Hematology   Anesthesia Other Findings   Reproductive/Obstetrics                             Anesthesia Physical Anesthesia Plan  ASA: II  Anesthesia Plan: MAC   Post-op Pain Management:    Induction:   Airway Management Planned:   Additional Equipment:   Intra-op Plan:   Post-operative Plan:   Informed Consent: I have reviewed the patients History and Physical, chart, labs and discussed the procedure including the risks, benefits and alternatives for the proposed anesthesia with the patient or authorized representative who has indicated his/her understanding and acceptance.     Plan Discussed with: CRNA  Anesthesia Plan Comments:         Anesthesia Quick Evaluation

## 2015-10-11 NOTE — H&P (Signed)
[facility]  [address] [place], [place] [address] Phone: [phone] Fax : [phone]  Primary Care Physician:  Baltazar Apo, MD Primary Gastroenterologist:  Dr. Allen Norris  Pre-Procedure History & Physical: HPI:  Cheryl [name] is a 69 y.o. female is here for an colonoscopy.   Past Medical History  Diagnosis Date  . Hypertension   . Depression   . Allergy     seasonal  . Cataract   . Gastric ulcer 2012  . Osteopenia   . Ulcerative colitis ([place])   . Complication of anesthesia     facial swelling after hysterectomy  . Wears dentures     partial upper  . Arthritis     hips  . Vertigo     with sinus issues    Past Surgical History  Procedure Laterality Date  . Cataract extraction, bilateral Bilateral 2005  . Appendectomy    . Colonoscopy  2012  . Abdominal hysterectomy    . Breast biopsy Left     2012 negative    Prior to Admission medications   Medication Sig Start Date End Date Taking? Authorizing Provider  alendronate (FOSAMAX) 70 MG tablet Take 70 mg by mouth.   Yes Historical Provider, MD  atorvastatin (LIPITOR) 20 MG tablet Take 20 mg by mouth at bedtime.   Yes Historical Provider, MD  buPROPion (WELLBUTRIN SR) 150 MG 12 hr tablet Take 150 mg by mouth daily.    Yes Historical Provider, MD  buPROPion (WELLBUTRIN XL) 150 MG 24 hr tablet  09/04/15  Yes Historical Provider, MD  busPIRone (BUSPAR) 5 MG tablet Take 5 mg by mouth 2 (two) times daily.   Yes Historical Provider, MD  calcium-vitamin D (OSCAL WITH D) 250-125 MG-UNIT tablet Take 1 tablet by mouth daily.   Yes Historical Provider, MD  cetirizine (ZYRTEC) 10 MG tablet Take 10 mg by mouth daily.    Yes Historical Provider, MD  diclofenac sodium (VOLTAREN) 1 % GEL Apply 2 g topically 3 (three) times daily as needed.    Yes Historical Provider, MD  esterified estrogens (MENEST) 0.625 MG tablet Take 0.625 mg by mouth daily.    Yes Historical Provider, MD  gabapentin (NEURONTIN) 600 MG  tablet Take 600 mg by mouth 2 (two) times daily. As needed   Yes Historical Provider, MD  HYDROcodone-acetaminophen (NORCO/VICODIN) 5-325 MG tablet  09/14/15  Yes Historical Provider, MD  losartan-hydrochlorothiazide (HYZAAR) 50-12.5 MG per tablet Take 1 tablet by mouth daily.    Yes Historical Provider, MD  meclizine (ANTIVERT) 25 MG tablet Take 25 mg by mouth 2 (two) times daily as needed.    Yes Historical Provider, MD  oxyCODONE-acetaminophen (PERCOCET/ROXICET) 5-325 MG tablet Take by mouth every 4 (four) hours as needed for severe pain.   Yes Historical Provider, MD  pantoprazole (PROTONIX) 40 MG tablet Take 40 mg by mouth daily.    Yes Historical Provider, MD  triamcinolone cream (KENALOG) 0.1 % Apply topically.   Yes Historical Provider, MD    Allergies as of 10/03/2015  . (No Known Allergies)    Family History  Problem Relation Age of Onset  . Arthritis Mother   . Diabetes Mother   . Depression Mother   . COPD Mother   . Heart disease Mother   . Hypertension Mother   . Alcohol abuse Father   . Stroke Father   . Stroke Paternal Aunt   . Breast cancer Maternal Grandmother     Social History   Social History  .  Marital Status: Divorced    Spouse Name: N/A  . Number of Children: N/A  . Years of Education: N/A   Occupational History  . Not on file.   Social History Main Topics  . Smoking status: Former Smoker -- 40 years    Types: Cigarettes    Quit date: 03/20/2010  . Smokeless tobacco: Never Used  . Alcohol Use: 1.8 oz/week    3 Glasses of wine per week  . Drug Use: No  . Sexual Activity: No   Other Topics Concern  . Not on file   Social History Narrative    Review of Systems: See HPI, otherwise negative ROS  Physical Exam: BP 129/81 mmHg  Pulse 74  Temp(Src) 96.1 F (35.6 C) (Temporal)  Resp 16  Ht 5' (1.524 m)  Wt 130 lb (58.968 kg)  BMI 25.39 kg/m2  SpO2 100% General:   Alert,  pleasant and cooperative in NAD Head:  Normocephalic and  atraumatic. Neck:  Supple; no masses or thyromegaly. Lungs:  Clear throughout to auscultation.    Heart:  Regular rate and rhythm. Abdomen:  Soft, nontender and nondistended. Normal bowel sounds, without guarding, and without rebound.   Neurologic:  Alert and  oriented x4;  grossly normal neurologically.  Impression/Plan: Cheryl [name] is here for an colonoscopy to be performed for diarrhea  Risks, benefits, limitations, and alternatives regarding  colonoscopy have been reviewed with the patient.  Questions have been answered.  All parties agreeable.   Ollen Bowl, MD  10/11/2015, 7:37 AM

## 2015-10-12 ENCOUNTER — Encounter: Payer: Self-pay | Admitting: Gastroenterology

## 2015-10-17 ENCOUNTER — Encounter: Payer: Self-pay | Admitting: Gastroenterology

## 2016-07-29 ENCOUNTER — Other Ambulatory Visit (HOSPITAL_COMMUNITY): Payer: Self-pay | Admitting: Neurosurgery

## 2016-07-29 DIAGNOSIS — M48061 Spinal stenosis, lumbar region without neurogenic claudication: Secondary | ICD-10-CM

## 2016-08-11 ENCOUNTER — Ambulatory Visit
Admission: RE | Admit: 2016-08-11 | Discharge: 2016-08-11 | Disposition: A | Discharge status: Home / Self Care | Payer: Medicare Other | Source: Ambulatory Visit | Attending: Neurosurgery | Admitting: Neurosurgery

## 2016-08-11 DIAGNOSIS — I714 Abdominal aortic aneurysm, without rupture: Secondary | ICD-10-CM | POA: Insufficient documentation

## 2016-08-11 DIAGNOSIS — M5126 Other intervertebral disc displacement, lumbar region: Secondary | ICD-10-CM | POA: Diagnosis not present

## 2016-08-11 DIAGNOSIS — M48061 Spinal stenosis, lumbar region without neurogenic claudication: Secondary | ICD-10-CM | POA: Insufficient documentation

## 2016-08-12 ENCOUNTER — Other Ambulatory Visit: Payer: Self-pay | Admitting: Family Medicine

## 2016-08-12 DIAGNOSIS — Z1231 Encounter for screening mammogram for malignant neoplasm of breast: Secondary | ICD-10-CM

## 2016-09-12 ENCOUNTER — Ambulatory Visit: Payer: Medicare Other

## 2016-09-12 ENCOUNTER — Ambulatory Visit
Admission: RE | Admit: 2016-09-12 | Discharge: 2016-09-12 | Disposition: A | Discharge status: Home / Self Care | Payer: Medicare Other | Source: Ambulatory Visit | Attending: Family Medicine | Admitting: Family Medicine

## 2016-09-12 DIAGNOSIS — Z1231 Encounter for screening mammogram for malignant neoplasm of breast: Secondary | ICD-10-CM

## 2017-02-28 ENCOUNTER — Encounter: Payer: Self-pay | Admitting: Emergency Medicine

## 2017-02-28 ENCOUNTER — Emergency Department: Payer: Medicare Other

## 2017-02-28 ENCOUNTER — Emergency Department
Admission: EM | Admit: 2017-02-28 | Discharge: 2017-02-28 | Disposition: A | Discharge status: Home / Self Care | Payer: Medicare Other | Attending: Emergency Medicine | Admitting: Emergency Medicine

## 2017-02-28 DIAGNOSIS — Y939 Activity, unspecified: Secondary | ICD-10-CM | POA: Insufficient documentation

## 2017-02-28 DIAGNOSIS — Y929 Unspecified place or not applicable: Secondary | ICD-10-CM | POA: Insufficient documentation

## 2017-02-28 DIAGNOSIS — S79922A Unspecified injury of left thigh, initial encounter: Secondary | ICD-10-CM | POA: Diagnosis present

## 2017-02-28 DIAGNOSIS — Z79899 Other long term (current) drug therapy: Secondary | ICD-10-CM | POA: Diagnosis not present

## 2017-02-28 DIAGNOSIS — M25552 Pain in left hip: Secondary | ICD-10-CM

## 2017-02-28 DIAGNOSIS — W0110XA Fall on same level from slipping, tripping and stumbling with subsequent striking against unspecified object, initial encounter: Secondary | ICD-10-CM | POA: Insufficient documentation

## 2017-02-28 DIAGNOSIS — Z87891 Personal history of nicotine dependence: Secondary | ICD-10-CM | POA: Insufficient documentation

## 2017-02-28 DIAGNOSIS — Z85048 Personal history of other malignant neoplasm of rectum, rectosigmoid junction, and anus: Secondary | ICD-10-CM | POA: Diagnosis not present

## 2017-02-28 DIAGNOSIS — S76212A Strain of adductor muscle, fascia and tendon of left thigh, initial encounter: Secondary | ICD-10-CM

## 2017-02-28 DIAGNOSIS — Y999 Unspecified external cause status: Secondary | ICD-10-CM | POA: Insufficient documentation

## 2017-02-28 DIAGNOSIS — S76812A Strain of other specified muscles, fascia and tendons at thigh level, left thigh, initial encounter: Secondary | ICD-10-CM | POA: Diagnosis not present

## 2017-02-28 DIAGNOSIS — I10 Essential (primary) hypertension: Secondary | ICD-10-CM | POA: Insufficient documentation

## 2017-02-28 DIAGNOSIS — M25551 Pain in right hip: Secondary | ICD-10-CM | POA: Insufficient documentation

## 2017-02-28 MED ORDER — KETOROLAC TROMETHAMINE 60 MG/2ML IM SOLN
30.0000 mg | Freq: Once | INTRAMUSCULAR | Status: AC
  Administered 2017-02-28: 30 mg via INTRAMUSCULAR
  Filled 2017-02-28: qty 2

## 2017-02-28 MED ORDER — NAPROXEN 375 MG PO TABS: 375.0000 mg | ORAL_TABLET | Freq: Two times a day (BID) | ORAL | 0 refills | Status: DC

## 2017-02-28 MED ORDER — TRAMADOL HCL 50 MG PO TABS: 50.0000 mg | ORAL_TABLET | Freq: Two times a day (BID) | ORAL | 0 refills | Status: DC | PRN

## 2017-02-28 MED ORDER — HYDROMORPHONE HCL 1 MG/ML IJ SOLN
0.5000 mg | Freq: Once | INTRAMUSCULAR | Status: AC
  Administered 2017-02-28: 0.5 mg via INTRAMUSCULAR
  Filled 2017-02-28: qty 1

## 2017-02-28 NOTE — ED Triage Notes (Signed)
Patient presents to the ED with bilateral hip pain x 2 days that is worse on the right side.  Patient states, "I fell on Monday, I tripped over the swing set and my left hip was really hurting but after a couple days, it was feeling better but then yesterday both hips started hurting, especially the right one, it's hard for me to walk."  Patient reports pain is minimal while sitting but is very painful when she stands or moves.

## 2017-02-28 NOTE — ED Notes (Signed)
AAOx3.  Skin warm and dry.  NAD 

## 2017-02-28 NOTE — ED Provider Notes (Signed)
[facility] Emergency Department Provider Note   ____________________________________________   First MD Initiated Contact with Patient 02/28/17 1424     (approximate)  I have reviewed the triage vital signs and the nursing notes.   HISTORY  Chief Complaint Hip Pain    HPI Cheryl [name] is a 71 y.o. female patient complaining of bilateral hip pain for 2 days. Patient states 5 days ago she fell after tripping over swing set and landed on the left posterior hip. Patient states she has left groin pain that resolved after a couple days. Patient state yesterday both hips without.. Especially pain was increased on the right side. Patient stated pain increases with ambulation. Patient stated pain decreased with sitting. No palliative measures taken for her complaint. Patient denies any bladder or bowel dysfunction. Patient denies any radicular component to her pain.Patient rates pain as a 2/10 while sitting and increased to a 6/10 with weightbearing. Patient described a pain as "achy".   Past Medical History:  Diagnosis Date  . Allergy    seasonal  . Arthritis    hips  . Cataract   . Complication of anesthesia    facial swelling after hysterectomy  . Depression   . Gastric ulcer 2012  . Hypertension   . Osteopenia   . Ulcerative colitis ([place])   . Vertigo    with sinus issues  . Wears dentures    partial upper    Patient Active Problem List   Diagnosis Date Noted  . Chronic diarrhea of unknown origin   . Benign neoplasm of ascending colon   . Benign neoplasm of cecum   . Spinal stenosis, lumbar region, with neurogenic claudication 09/06/2015  . DDD (degenerative disc disease), lumbar 03/30/2015  . Lumbar facet arthropathy ([place]) 03/30/2015  . Lymphocytic colitis 03/27/2015  . Degenerative lumbar disc 03/21/2015  . Facet syndrome, lumbar ([place]) 03/21/2015  . Lumbar radiculopathy 03/21/2015  . Sacroiliac joint dysfunction 03/21/2015  . CD  (contact dermatitis) 03/23/2014  . Foot pain 03/23/2014  . Fracture, stress, metatarsal 03/23/2014    Past Surgical History:  Procedure Laterality Date  . ABDOMINAL HYSTERECTOMY    . APPENDECTOMY    . BREAST BIOPSY Left    2012 negative  . CATARACT EXTRACTION, BILATERAL Bilateral 2005  . COLONOSCOPY  2012  . COLONOSCOPY WITH PROPOFOL N/A 10/11/2015   Procedure: COLONOSCOPY WITH PROPOFOL;  Surgeon: Lucilla Lame, MD;  Location: [place];  Service: Endoscopy;  Laterality: N/A;  . POLYPECTOMY  10/11/2015   Procedure: POLYPECTOMY;  Surgeon: Lucilla Lame, MD;  Location: [place];  Service: Endoscopy;;    Prior to Admission medications   Medication Sig Start Date End Date Taking? Authorizing Provider  alendronate (FOSAMAX) 70 MG tablet Take 70 mg by mouth.    [provider]  atorvastatin (LIPITOR) 20 MG tablet Take 20 mg by mouth at bedtime.    [provider]  buPROPion (WELLBUTRIN SR) 150 MG 12 hr tablet Take 150 mg by mouth daily.     [provider]  buPROPion (WELLBUTRIN XL) 150 MG 24 hr tablet  09/04/15   [provider]  busPIRone (BUSPAR) 5 MG tablet Take 5 mg by mouth 2 (two) times daily.    [provider]  calcium-vitamin D (OSCAL WITH D) 250-125 MG-UNIT tablet Take 1 tablet by mouth daily.    [provider]  cetirizine (ZYRTEC) 10 MG tablet Take 10 mg by mouth daily.     [provider]  diclofenac sodium (VOLTAREN) 1 % GEL Apply 2 g topically 3 (three) times daily as needed.     [provider]  esterified estrogens (MENEST) 0.625 MG tablet Take 0.625 mg by mouth daily.     [provider]  gabapentin (NEURONTIN) 600 MG tablet Take 600 mg by mouth 2 (two) times daily. As needed    [provider]  HYDROcodone-acetaminophen (NORCO/VICODIN) 5-325 MG tablet  09/14/15   [provider]  losartan-hydrochlorothiazide (HYZAAR) 50-12.5 MG per tablet Take 1 tablet by  mouth daily.     [provider]  meclizine (ANTIVERT) 25 MG tablet Take 25 mg by mouth 2 (two) times daily as needed.     [provider]  naproxen (NAPROSYN) 375 MG tablet Take 1 tablet (375 mg total) by mouth 2 (two) times daily with a meal. 02/28/17   Sable Feil, PA-C  oxyCODONE-acetaminophen (PERCOCET/ROXICET) 5-325 MG tablet Take by mouth every 4 (four) hours as needed for severe pain.    [provider]  pantoprazole (PROTONIX) 40 MG tablet Take 40 mg by mouth daily.     [provider]  traMADol (ULTRAM) 50 MG tablet Take 1 tablet (50 mg total) by mouth every 12 (twelve) hours as needed. 02/28/17   Sable Feil, PA-C  triamcinolone cream (KENALOG) 0.1 % Apply topically.    [provider]    Allergies Patient has no known allergies.  Family History  Problem Relation Age of Onset  . Arthritis Mother   . Diabetes Mother   . Depression Mother   . COPD Mother   . Heart disease Mother   . Hypertension Mother   . Alcohol abuse Father   . Stroke Father   . Stroke Paternal Aunt   . Breast cancer Maternal Grandmother     Social History Social History  Substance Use Topics  . Smoking status: Former Smoker    Years: 40.00    Types: Cigarettes    Quit date: 03/20/2010  . Smokeless tobacco: Never Used  . Alcohol use 1.8 oz/week    3 Glasses of wine per week    Review of Systems  Constitutional: No fever/chills Eyes: No visual changes. ENT: No sore throat. Cardiovascular: Denies chest pain. Respiratory: Denies shortness of breath. Gastrointestinal: No abdominal pain.  No nausea, no vomiting.  No diarrhea.  No constipation. Genitourinary: Negative for dysuria. Musculoskeletal: Negative for back pain. Skin: Negative for rash. Neurological: Negative for headaches, focal weakness or numbness. Psychiatric:Depression Endocrine:Hypertension   ____________________________________________   PHYSICAL EXAM:  VITAL SIGNS: ED  Triage Vitals [02/28/17 1335]  Enc Vitals Group     BP 134/83     Pulse Rate (!) 101     Resp 18     Temp 98.8 F (37.1 C)     Temp Source Oral     SpO2 97 %     Weight 137 lb (62.1 kg)     Height 5' (1.524 m)     Head Circumference      Peak Flow      Pain Score 2     Pain Loc      Pain Edu?      Excl. in [place]?     Constitutional: Alert and oriented. Well appearing and in no acute distress. Eyes: Conjunctivae are normal. PERRL. EOMI. Head: Atraumatic. Nose: No congestion/rhinnorhea. Mouth/Throat: Mucous membranes are moist.  Oropharynx non-erythematous. Neck: No stridor.  No cervical spine tenderness to palpation. Cardiovascular: Normal rate, regular  rhythm. Grossly normal heart sounds.  Good peripheral circulation. Respiratory: Normal respiratory effort.  No retractions. Lungs CTAB. Gastrointestinal: Soft and nontender. No distention. No abdominal bruits. No CVA tenderness. Musculoskeletal: No obvious deformity or leg length discrepancy. Patient has increased guarding with pain to the left inguinal area with adduction of the hip. Patient has increased guarding palpation of posterior right iliac crest.  Neurologic:  Normal speech and language. No gross focal neurologic deficits are appreciated. No gait instability.  Skin:  Skin is warm, dry and intact. No rash noted. Psychiatric: Mood and affect are normal. Speech and behavior are normal.  ____________________________________________   LABS (all labs ordered are listed, but only abnormal results are displayed)  Labs Reviewed - No data to display ____________________________________________  EKG   ____________________________________________  RADIOLOGY  No acute findings x-ray is bilateral hip ____________________________________________   PROCEDURES  Procedure(s) performed:   Procedures  Critical Care performed: No  ____________________________________________   INITIAL IMPRESSION / ASSESSMENT AND PLAN /  ED COURSE  Pertinent labs & imaging results that were available during my care of the patient were reviewed by me and considered in my medical decision making (see chart for details).  Left inguinal strain and bilateral hip pain secondary to fall. Discussed x-ray findings with patient. Patient given discharge care instructions. Patient last file PCP if no improvement within 5-7 days.      ____________________________________________   FINAL CLINICAL IMPRESSION(S) / ED DIAGNOSES  Final diagnoses:  Right hip pain  Left hip pain  Inguinal strain, left, initial encounter      NEW MEDICATIONS STARTED DURING THIS VISIT:  New Prescriptions   NAPROXEN (NAPROSYN) 375 MG TABLET    Take 1 tablet (375 mg total) by mouth 2 (two) times daily with a meal.   TRAMADOL (ULTRAM) 50 MG TABLET    Take 1 tablet (50 mg total) by mouth every 12 (twelve) hours as needed.     Note:  This document was prepared using Dragon voice recognition software and may include unintentional dictation errors.    Sable Feil, PA-C 02/28/17 1556    Lisa Roca, MD 02/28/17 [phone]

## 2017-03-11 ENCOUNTER — Encounter: Payer: Self-pay | Admitting: Emergency Medicine

## 2017-03-11 ENCOUNTER — Emergency Department: Payer: Medicare Other

## 2017-03-11 ENCOUNTER — Emergency Department
Admission: EM | Admit: 2017-03-11 | Discharge: 2017-03-11 | Disposition: A | Discharge status: Home / Self Care | Payer: Medicare Other | Attending: Emergency Medicine | Admitting: Emergency Medicine

## 2017-03-11 DIAGNOSIS — M25552 Pain in left hip: Secondary | ICD-10-CM | POA: Diagnosis not present

## 2017-03-11 DIAGNOSIS — M545 Low back pain, unspecified: Secondary | ICD-10-CM

## 2017-03-11 DIAGNOSIS — I10 Essential (primary) hypertension: Secondary | ICD-10-CM | POA: Insufficient documentation

## 2017-03-11 DIAGNOSIS — S79911D Unspecified injury of right hip, subsequent encounter: Secondary | ICD-10-CM | POA: Diagnosis present

## 2017-03-11 DIAGNOSIS — Z79899 Other long term (current) drug therapy: Secondary | ICD-10-CM | POA: Insufficient documentation

## 2017-03-11 DIAGNOSIS — W1800XD Striking against unspecified object with subsequent fall, subsequent encounter: Secondary | ICD-10-CM | POA: Diagnosis not present

## 2017-03-11 DIAGNOSIS — M25551 Pain in right hip: Secondary | ICD-10-CM | POA: Insufficient documentation

## 2017-03-11 DIAGNOSIS — Z87891 Personal history of nicotine dependence: Secondary | ICD-10-CM | POA: Insufficient documentation

## 2017-03-11 DIAGNOSIS — W19XXXD Unspecified fall, subsequent encounter: Secondary | ICD-10-CM

## 2017-03-11 MED ORDER — OXYCODONE-ACETAMINOPHEN 5-325 MG PO TABS
1.0000 | ORAL_TABLET | Freq: Once | ORAL | Status: AC
  Administered 2017-03-11: 1 via ORAL
  Filled 2017-03-11: qty 1

## 2017-03-11 MED ORDER — OXYCODONE-ACETAMINOPHEN 5-325 MG PO TABS: ORAL_TABLET | ORAL | 0 refills | Status: DC

## 2017-03-11 NOTE — Discharge Instructions (Signed)
Follow-up with primary care doctor if any continued problems. Begin taking Percocet 1 every 4-6 hours as needed for severe pain. You may also use ice or heat to your back as needed for comfort. Do not take extra Tylenol with the Percocet as it already has an amount of Tylenol in the tablet.

## 2017-03-11 NOTE — ED Provider Notes (Signed)
[facility] Emergency Department Provider Note  ____________________________________________   First MD Initiated Contact with Patient 03/11/17 1208     (approximate)  I have reviewed the triage vital signs and the nursing notes.   HISTORY  Chief Complaint Hip Pain    HPI Cheryl [name] is a 71 y.o. female is here with complaint of continued right sided hip pain and left groin pain. Patient states she fell backwards and landed on her buttocks prior to being seen in the emergency department. Patient was ambulatory immediately after her fall. She denies any head injury or loss of consciousness. Patient was seen in the ED on 02/28/17 at which time x-rays bilateral hips did not show any signs of fracture. Patient was discharged on naproxen and tramadol. Patient states that she has completed taking these medications but they did very little for her pain. Patient is aware that she does have some arthritis but states that the amount of pain that she is in is more than just arthritic pain. Currently she rates her pain as 10 over 10.   Past Medical History:  Diagnosis Date  . Allergy    seasonal  . Arthritis    hips  . Cataract   . Complication of anesthesia    facial swelling after hysterectomy  . Depression   . Gastric ulcer 2012  . Hypertension   . Osteopenia   . Ulcerative colitis ([place])   . Vertigo    with sinus issues  . Wears dentures    partial upper    Patient Active Problem List   Diagnosis Date Noted  . Chronic diarrhea of unknown origin   . Benign neoplasm of ascending colon   . Benign neoplasm of cecum   . Spinal stenosis, lumbar region, with neurogenic claudication 09/06/2015  . DDD (degenerative disc disease), lumbar 03/30/2015  . Lumbar facet arthropathy ([place]) 03/30/2015  . Lymphocytic colitis 03/27/2015  . Degenerative lumbar disc 03/21/2015  . Facet syndrome, lumbar ([place]) 03/21/2015  . Lumbar radiculopathy 03/21/2015  . Sacroiliac  joint dysfunction 03/21/2015  . CD (contact dermatitis) 03/23/2014  . Foot pain 03/23/2014  . Fracture, stress, metatarsal 03/23/2014    Past Surgical History:  Procedure Laterality Date  . ABDOMINAL HYSTERECTOMY    . APPENDECTOMY    . BREAST BIOPSY Left    2012 negative  . CATARACT EXTRACTION, BILATERAL Bilateral 2005  . COLONOSCOPY  2012  . COLONOSCOPY WITH PROPOFOL N/A 10/11/2015   Procedure: COLONOSCOPY WITH PROPOFOL;  Surgeon: Lucilla Lame, MD;  Location: [place];  Service: Endoscopy;  Laterality: N/A;  . POLYPECTOMY  10/11/2015   Procedure: POLYPECTOMY;  Surgeon: Lucilla Lame, MD;  Location: [place];  Service: Endoscopy;;    Prior to Admission medications   Medication Sig Start Date End Date Taking? Authorizing Provider  alendronate (FOSAMAX) 70 MG tablet Take 70 mg by mouth.    [provider]  atorvastatin (LIPITOR) 20 MG tablet Take 20 mg by mouth at bedtime.    [provider]  buPROPion (WELLBUTRIN SR) 150 MG 12 hr tablet Take 150 mg by mouth daily.     [provider]  buPROPion (WELLBUTRIN XL) 150 MG 24 hr tablet  09/04/15   [provider]  busPIRone (BUSPAR) 5 MG tablet Take 5 mg by mouth 2 (two) times daily.    [provider]  calcium-vitamin D (OSCAL WITH D) 250-125 MG-UNIT tablet Take 1 tablet by mouth daily.    [provider]  cetirizine (ZYRTEC) 10 MG tablet Take 10 mg by mouth daily.     [provider]  diclofenac sodium (VOLTAREN) 1 % GEL Apply 2 g topically 3 (three) times daily as needed.     [provider]  esterified estrogens (MENEST) 0.625 MG tablet Take 0.625 mg by mouth daily.     [provider]  gabapentin (NEURONTIN) 600 MG tablet Take 600 mg by mouth 2 (two) times daily. As needed    [provider]  losartan-hydrochlorothiazide (HYZAAR) 50-12.5 MG per tablet Take 1 tablet by mouth daily.     [provider]  meclizine  (ANTIVERT) 25 MG tablet Take 25 mg by mouth 2 (two) times daily as needed.     [provider]  oxyCODONE-acetaminophen (PERCOCET) 5-325 MG tablet 1 tablet every 4 - 6 hours prn pain 03/11/17   Letitia Neri L, PA-C  pantoprazole (PROTONIX) 40 MG tablet Take 40 mg by mouth daily.     [provider]  triamcinolone cream (KENALOG) 0.1 % Apply topically.    [provider]    Allergies Patient has no known allergies.  Family History  Problem Relation Age of Onset  . Arthritis Mother   . Diabetes Mother   . Depression Mother   . COPD Mother   . Heart disease Mother   . Hypertension Mother   . Alcohol abuse Father   . Stroke Father   . Stroke Paternal Aunt   . Breast cancer Maternal Grandmother     Social History Social History  Substance Use Topics  . Smoking status: Former Smoker    Years: 40.00    Types: Cigarettes    Quit date: 03/20/2010  . Smokeless tobacco: Never Used  . Alcohol use 1.8 oz/week    3 Glasses of wine per week    Review of Systems Constitutional: No fever/chills Eyes: No visual changes. ENT: No trauma Cardiovascular: Denies chest pain. Respiratory: Denies shortness of breath. Musculoskeletal: History status post back surgery. Positive continued hip pain. Skin: Negative for rash. Neurological: Negative for headaches, focal weakness or numbness.   ____________________________________________   PHYSICAL EXAM:  VITAL SIGNS: ED Triage Vitals  Enc Vitals Group     BP 03/11/17 1200 (!) 143/91     Pulse Rate 03/11/17 1200 82     Resp 03/11/17 1200 18     Temp 03/11/17 1200 98.1 F (36.7 C)     Temp Source 03/11/17 1200 Oral     SpO2 03/11/17 1200 95 %     Weight 03/11/17 1200 136 lb (61.7 kg)     Height 03/11/17 1200 5' (1.524 m)     Head Circumference --      Peak Flow --      Pain Score 03/11/17 1159 10     Pain Loc --      Pain Edu? --      Excl. in [place]? --    Constitutional: Alert and oriented. Well appearing  and in no acute distress. Eyes: Conjunctivae are normal. PERRL. EOMI. Head: Atraumatic. Mouth/Throat: Mucous membranes are moist.  Oropharynx non-erythematous. Neck: No stridor.   Cardiovascular: Normal rate, regular rhythm. Grossly normal heart sounds.  Good peripheral circulation. Respiratory: Normal respiratory effort.  No retractions. Lungs CTAB. Gastrointestinal: Soft and nontender. No distention.  Musculoskeletal:  Exam there is moderate tenderness on palpation of the sacral area along with L5-S1. Range of motion is slow and guarded secondary to pain. There is no gross deformity noted over the  right hip. Left hip range of motion is guarded secondary to pain. Patient remains in wheelchair for comfort. Neurologic:  Normal speech and language. No gross focal neurologic deficits are appreciated. No gait instability. Skin:  Skin is warm, dry and intact.  Psychiatric: Mood and affect are normal. Speech and behavior are normal.  ____________________________________________   LABS (all labs ordered are listed, but only abnormal results are displayed)  Labs Reviewed - No data to display  RADIOLOGY Lumbar spine x-ray per radiologist: IMPRESSION:  No acute compression fracture or spondylolisthesis. Mild  degenerative disc space narrowing at L4-5 which is more conspicuous  than on the previous study. Probable bilateral laminectomy at L5.   I, Johnn Hai, personally viewed and evaluated these images (plain radiographs) as part of my medical decision making, as well as reviewing the written report by the radiologist.  ____________________________________________   PROCEDURES  Procedure(s) performed: None  Procedures  Critical Care performed: No  ____________________________________________   INITIAL IMPRESSION / ASSESSMENT AND PLAN / ED COURSE  Pertinent labs & imaging results that were available during my care of the patient were reviewed by me and considered in my medical  decision making (see chart for details).  Patient states that medication that she was given on her last visit did not help with her pain. Patient was given a prescription for Percocet along with 1 by mouth while in the emergency department. She is to follow-up with her PCP at [facility] if any continued problems. Patient is no longer taking naproxen or tramadol. She states that she completed this medication and it did not help with her pain.   ____________________________________________   FINAL CLINICAL IMPRESSION(S) / ED DIAGNOSES  Final diagnoses:  Acute midline low back pain without sciatica  Bilateral hip pain  Fall, subsequent encounter      NEW MEDICATIONS STARTED DURING THIS VISIT:  Discharge Medication List as of 03/11/2017  1:52 PM       Note:  This document was prepared using Dragon voice recognition software and may include unintentional dictation errors.    Johnn Hai, PA-C 03/11/17 [place], [place], MD 03/11/17 [phone]

## 2017-03-11 NOTE — ED Triage Notes (Signed)
Pt c/o bil hip pain, worse on right. S/p fall 3 weeks ago. Seen for same 02/28/17 and given anti-inflammatories and pain pills. Pt states pt is getting worse. Denies new injury.

## 2017-06-02 ENCOUNTER — Other Ambulatory Visit: Payer: Self-pay | Admitting: Emergency Medicine

## 2017-10-03 ENCOUNTER — Other Ambulatory Visit: Payer: Self-pay

## 2017-10-03 ENCOUNTER — Encounter: Payer: Self-pay | Admitting: Emergency Medicine

## 2017-10-03 ENCOUNTER — Emergency Department
Admission: EM | Admit: 2017-10-03 | Discharge: 2017-10-03 | Disposition: A | Discharge status: Home / Self Care | Payer: Medicare Other | Attending: Emergency Medicine | Admitting: Emergency Medicine

## 2017-10-03 DIAGNOSIS — Y929 Unspecified place or not applicable: Secondary | ICD-10-CM | POA: Insufficient documentation

## 2017-10-03 DIAGNOSIS — I10 Essential (primary) hypertension: Secondary | ICD-10-CM | POA: Insufficient documentation

## 2017-10-03 DIAGNOSIS — Z87891 Personal history of nicotine dependence: Secondary | ICD-10-CM | POA: Insufficient documentation

## 2017-10-03 DIAGNOSIS — Z79899 Other long term (current) drug therapy: Secondary | ICD-10-CM | POA: Diagnosis not present

## 2017-10-03 DIAGNOSIS — W269XXA Contact with unspecified sharp object(s), initial encounter: Secondary | ICD-10-CM | POA: Insufficient documentation

## 2017-10-03 DIAGNOSIS — T148XXA Other injury of unspecified body region, initial encounter: Secondary | ICD-10-CM

## 2017-10-03 DIAGNOSIS — Y999 Unspecified external cause status: Secondary | ICD-10-CM | POA: Diagnosis not present

## 2017-10-03 DIAGNOSIS — S61212A Laceration without foreign body of right middle finger without damage to nail, initial encounter: Secondary | ICD-10-CM | POA: Insufficient documentation

## 2017-10-03 DIAGNOSIS — S6991XA Unspecified injury of right wrist, hand and finger(s), initial encounter: Secondary | ICD-10-CM | POA: Diagnosis present

## 2017-10-03 DIAGNOSIS — Y939 Activity, unspecified: Secondary | ICD-10-CM | POA: Insufficient documentation

## 2017-10-03 DIAGNOSIS — Z23 Encounter for immunization: Secondary | ICD-10-CM | POA: Diagnosis not present

## 2017-10-03 MED ORDER — BACITRACIN ZINC 500 UNIT/GM EX OINT
TOPICAL_OINTMENT | CUTANEOUS | Status: AC
  Administered 2017-10-03: 1
  Filled 2017-10-03: qty 0.9

## 2017-10-03 MED ORDER — TETANUS-DIPHTH-ACELL PERTUSSIS 5-2.5-18.5 LF-MCG/0.5 IM SUSP
0.5000 mL | Freq: Once | INTRAMUSCULAR | Status: AC
  Administered 2017-10-03: 0.5 mL via INTRAMUSCULAR
  Filled 2017-10-03: qty 0.5

## 2017-10-03 MED ORDER — DOUBLE ANTIBIOTIC 500-10000 UNIT/GM EX OINT
TOPICAL_OINTMENT | Freq: Once | CUTANEOUS | Status: AC
  Administered 2017-10-03: 13:00:00 via TOPICAL
  Filled 2017-10-03: qty 1

## 2017-10-03 NOTE — ED Triage Notes (Signed)
Cut right middle finger on plastic container.

## 2017-10-03 NOTE — Discharge Instructions (Signed)
Follow-up with your regular doctor if there is any sign of infection, look for redness pus or drainage, keep the area as clean and dry as possible, you could apply antibiotic ointment once a day, it will take several days for the skin to lay at the base down, as long as the finger is not bleeding anymore you can continue with this treatment, if you are worsening please return to the emergency department

## 2017-10-03 NOTE — ED Provider Notes (Signed)
[facility] Emergency Department Provider Note  ____________________________________________   First MD Initiated Contact with Patient 10/03/17 1156     (approximate)  I have reviewed the triage vital signs and the nursing notes.   HISTORY  Chief Complaint Laceration    HPI Cheryl [name] is a 71 y.o. female complains of a laceration to the right middle finger, states she cut it on plastic, she could not get the bleeding to stop at home, unsure if she could be stitches or not, is unsure of her last tetanus  Past Medical History:  Diagnosis Date  . Allergy    seasonal  . Arthritis    hips  . Cataract   . Complication of anesthesia    facial swelling after hysterectomy  . Depression   . Gastric ulcer 2012  . Hypertension   . Osteopenia   . Ulcerative colitis ([place])   . Vertigo    with sinus issues  . Wears dentures    partial upper    Patient Active Problem List   Diagnosis Date Noted  . Chronic diarrhea of unknown origin   . Benign neoplasm of ascending colon   . Benign neoplasm of cecum   . Spinal stenosis, lumbar region, with neurogenic claudication 09/06/2015  . DDD (degenerative disc disease), lumbar 03/30/2015  . Lumbar facet arthropathy 03/30/2015  . Lymphocytic colitis 03/27/2015  . Degenerative lumbar disc 03/21/2015  . Facet syndrome, lumbar 03/21/2015  . Lumbar radiculopathy 03/21/2015  . Sacroiliac joint dysfunction 03/21/2015  . CD (contact dermatitis) 03/23/2014  . Foot pain 03/23/2014  . Fracture, stress, metatarsal 03/23/2014    Past Surgical History:  Procedure Laterality Date  . ABDOMINAL HYSTERECTOMY    . APPENDECTOMY    . BREAST BIOPSY Left    2012 negative  . CATARACT EXTRACTION, BILATERAL Bilateral 2005  . COLONOSCOPY  2012  . COLONOSCOPY WITH PROPOFOL N/A 10/11/2015   Procedure: COLONOSCOPY WITH PROPOFOL;  Surgeon: Lucilla Lame, MD;  Location: [place];  Service: Endoscopy;  Laterality: N/A;    . POLYPECTOMY  10/11/2015   Procedure: POLYPECTOMY;  Surgeon: Lucilla Lame, MD;  Location: [place];  Service: Endoscopy;;    Prior to Admission medications   Medication Sig Start Date End Date Taking? Authorizing Provider  alendronate (FOSAMAX) 70 MG tablet Take 70 mg by mouth.    [provider]  atorvastatin (LIPITOR) 20 MG tablet Take 20 mg by mouth at bedtime.    [provider]  buPROPion (WELLBUTRIN SR) 150 MG 12 hr tablet Take 150 mg by mouth daily.     [provider]  buPROPion (WELLBUTRIN XL) 150 MG 24 hr tablet  09/04/15   [provider]  busPIRone (BUSPAR) 5 MG tablet Take 5 mg by mouth 2 (two) times daily.    [provider]  calcium-vitamin D (OSCAL WITH D) 250-125 MG-UNIT tablet Take 1 tablet by mouth daily.    [provider]  cetirizine (ZYRTEC) 10 MG tablet Take 10 mg by mouth daily.     [provider]  diclofenac sodium (VOLTAREN) 1 % GEL Apply 2 g topically 3 (three) times daily as needed.     [provider]  esterified estrogens (MENEST) 0.625 MG tablet Take 0.625 mg by mouth daily.     [provider]  gabapentin (NEURONTIN) 600 MG tablet Take 600 mg by mouth 2 (two) times daily. As needed    [provider]  losartan-hydrochlorothiazide (HYZAAR) 50-12.5 MG per  tablet Take 1 tablet by mouth daily.     [provider]  meclizine (ANTIVERT) 25 MG tablet Take 25 mg by mouth 2 (two) times daily as needed.     [provider]  oxyCODONE-acetaminophen (PERCOCET) 5-325 MG tablet 1 tablet every 4 - 6 hours prn pain 03/11/17   Letitia Neri L, PA-C  pantoprazole (PROTONIX) 40 MG tablet Take 40 mg by mouth daily.     [provider]  triamcinolone cream (KENALOG) 0.1 % Apply topically.    [provider]    Allergies Patient has no known allergies.  Family History  Problem Relation Age of Onset  . Arthritis Mother   . Diabetes Mother    . Depression Mother   . COPD Mother   . Heart disease Mother   . Hypertension Mother   . Alcohol abuse Father   . Stroke Father   . Stroke Paternal Aunt   . Breast cancer Maternal Grandmother     Social History Social History   Tobacco Use  . Smoking status: Former Smoker    Years: 40.00    Types: Cigarettes    Last attempt to quit: 03/20/2010    Years since quitting: 7.5  . Smokeless tobacco: Never Used  Substance Use Topics  . Alcohol use: Yes    Alcohol/week: 1.8 oz    Types: 3 Glasses of wine per week  . Drug use: No    Review of Systems  Constitutional: No fever/chills Eyes: No visual changes. ENT: No sore throat. Respiratory: Denies cough Genitourinary: Negative for dysuria. Musculoskeletal: Negative for back pain. Skin: Negative for rash.  Positive for laceration    ____________________________________________   PHYSICAL EXAM:  VITAL SIGNS: ED Triage Vitals  Enc Vitals Group     BP 10/03/17 1103 (!) 145/84     Pulse Rate 10/03/17 1103 87     Resp 10/03/17 1103 16     Temp 10/03/17 1103 98.2 F (36.8 C)     Temp Source 10/03/17 1103 Oral     SpO2 10/03/17 1103 94 %     Weight 10/03/17 1102 136 lb (61.7 kg)     Height 10/03/17 1102 5' (1.524 m)     Head Circumference --      Peak Flow --      Pain Score 10/03/17 1102 0     Pain Loc --      Pain Edu? --      Excl. in [place]? --     Constitutional: Alert and oriented. Well appearing and in no acute distress. Eyes: Conjunctivae are normal.  Head: Atraumatic. Nose: No congestion/rhinnorhea. Cardiovascular: Normal rate, regular rhythm.  Heart sounds are normal Respiratory: Normal respiratory effort.  No retractions, lungs are clear to auscultation GU: deferred Musculoskeletal: FROM all extremities, warm and well perfused Neurologic:  Normal speech and language.  Skin:  Skin is warm, dry , there is a 1 cm skin avulsion of the right middle finger, the bleeding has resolved, neurovascular is  intact Psychiatric: Mood and affect are normal. Speech and behavior are normal.  ____________________________________________   LABS (all labs ordered are listed, but only abnormal results are displayed)  Labs Reviewed - No data to display ____________________________________________   ____________________________________________  RADIOLOGY    ____________________________________________   PROCEDURES  Procedure(s) performed: Clean the wound with normal saline, the nurses to apply antibiotic ointment and a dressing      ____________________________________________   INITIAL IMPRESSION / ASSESSMENT AND PLAN / ED COURSE  Pertinent labs & imaging results that were available during my care of the patient were reviewed by me and considered in my medical decision making (see chart for details).  Patient is a 71 year old female with a skin avulsion to the right middle finger, bleeding has resolved at this time, dressing was applied, Tdap was given in the emergency department, she was given instructions to watch for signs of infection, she is to return to the emergency department if she is worsening, she should see her regular doctor if needed, she was discharged in stable condition      ____________________________________________   FINAL CLINICAL IMPRESSION(S) / ED DIAGNOSES  Final diagnoses:  Skin avulsion      NEW MEDICATIONS STARTED DURING THIS VISIT:  This SmartLink is deprecated. Use AVSMEDLIST instead to display the medication list for a patient.   Note:  This document was prepared using Dragon voice recognition software and may include unintentional dictation errors.    Versie Starks, PA 10/03/17 1227    Darel Hong, MD 10/03/17 1350

## 2017-12-22 ENCOUNTER — Other Ambulatory Visit: Payer: Self-pay | Admitting: Family Medicine

## 2017-12-22 DIAGNOSIS — Z1231 Encounter for screening mammogram for malignant neoplasm of breast: Secondary | ICD-10-CM

## 2017-12-22 DIAGNOSIS — M8580 Other specified disorders of bone density and structure, unspecified site: Secondary | ICD-10-CM

## 2018-01-11 ENCOUNTER — Ambulatory Visit: Payer: Medicare Other

## 2018-02-08 ENCOUNTER — Ambulatory Visit
Admission: RE | Admit: 2018-02-08 | Discharge: 2018-02-08 | Disposition: A | Discharge status: Home / Self Care | Payer: Medicare Other | Source: Ambulatory Visit | Attending: Family Medicine | Admitting: Family Medicine

## 2018-02-08 DIAGNOSIS — Z1231 Encounter for screening mammogram for malignant neoplasm of breast: Secondary | ICD-10-CM | POA: Diagnosis present

## 2018-02-08 DIAGNOSIS — Z13820 Encounter for screening for osteoporosis: Secondary | ICD-10-CM | POA: Insufficient documentation

## 2018-02-08 DIAGNOSIS — M8580 Other specified disorders of bone density and structure, unspecified site: Secondary | ICD-10-CM

## 2018-07-22 IMAGING — MR MR LUMBAR SPINE W/O CM
5 series · 36 of 48 positions shown · non-contrast
Comparison: 07/27/2014 lumbar spine MR.

CLINICAL DATA: 70-year-old female with low back and bilateral leg
pain with numbness in both feet for 3-4 years. Subsequent encounter.

EXAM:
MRI LUMBAR SPINE WITHOUT CONTRAST
TECHNIQUE: Multiplanar, multisequence MR imaging of the lumbar spine was
performed. No intravenous contrast was administered.

[Series 2: T2 · sagittal · 4.0mm · 0.81mm/px · 6 of 15 slices shown (1 of 2)]
[im 1/15]
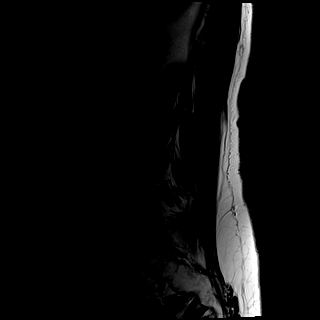
[im 3/15]
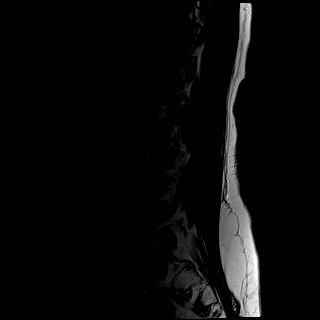
[im 6/15]
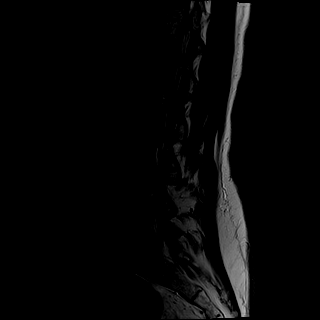
[im 9/15]
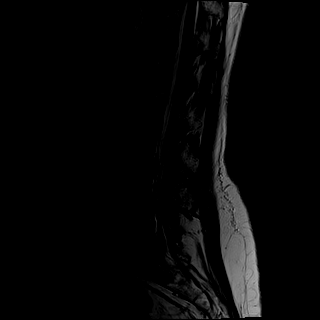
[im 12/15]
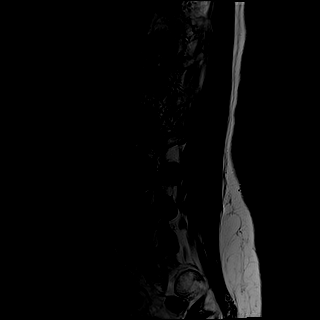
[im 15/15]
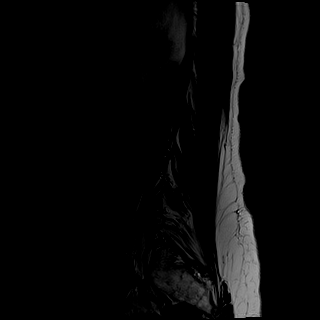

[Series 3: T1 · sagittal · 4.0mm · 0.81mm/px · 6 of 15 slices shown (1 of 2)]
[im 1/15]
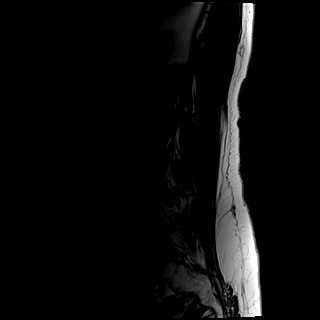
[im 3/15]
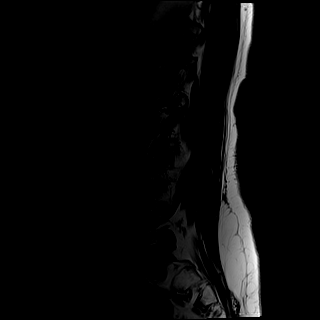
[im 6/15]
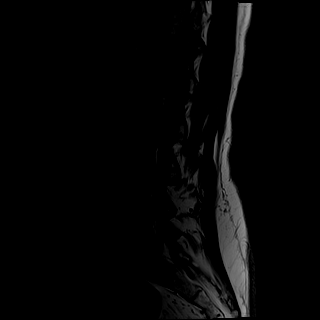
[im 9/15]
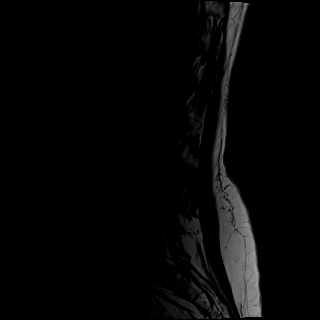
[im 12/15]
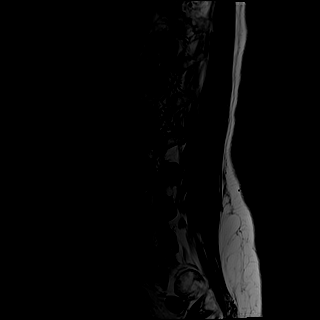
[im 15/15]
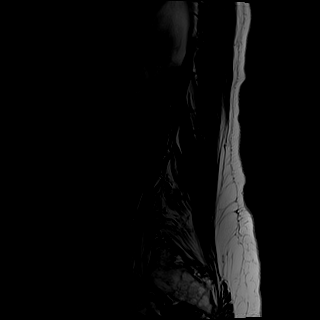

[Series 4: STIR · sagittal · 4.0mm · 1.02mm/px · 6 of 15 slices shown]
[im 1/15]
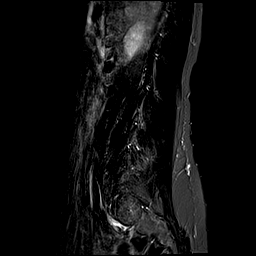
[im 3/15]
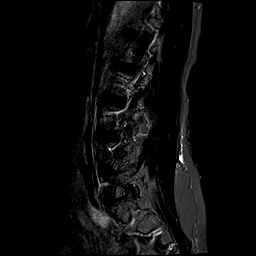
[im 6/15]
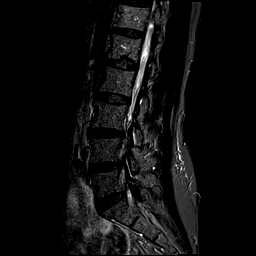
[im 9/15]
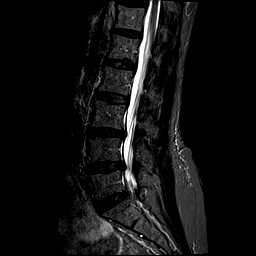
[im 12/15]
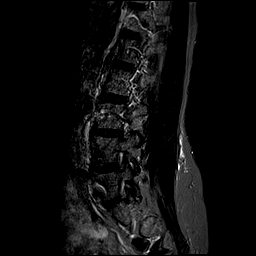
[im 15/15]
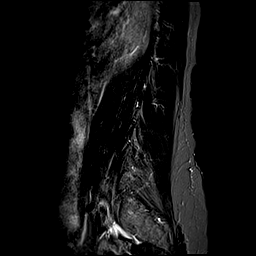

[Series 5: T2 · axial · 4.0mm · 0.78mm/px · z∈[-97,+113]mm · 9 of 39 slices shown (2 of 2)]
[im 1/39]
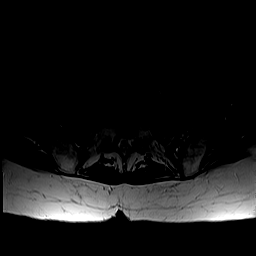
[im 6/39]
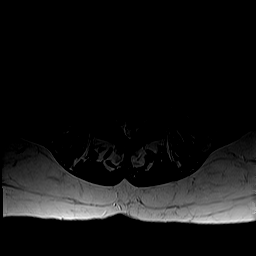
[im 11/39]
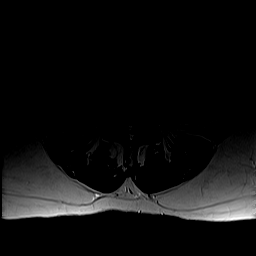
[im 17/39]
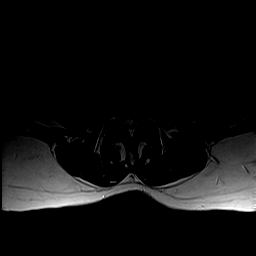
[im 20/39]
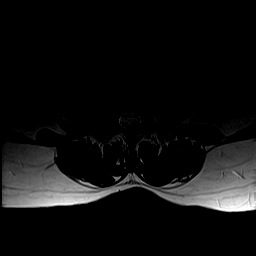
[im 22/39]
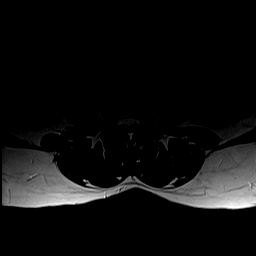
[im 28/39]
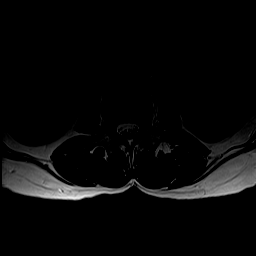
[im 33/39]
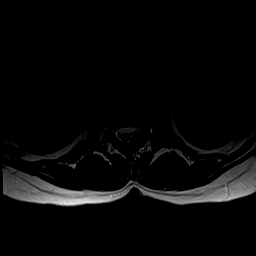
[im 39/39]
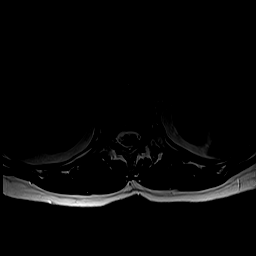

[Series 6: T1 · axial · 4.0mm · 0.39mm/px · z∈[-97,+113]mm · 9 of 39 slices shown (2 of 2)]
[im 1/39]
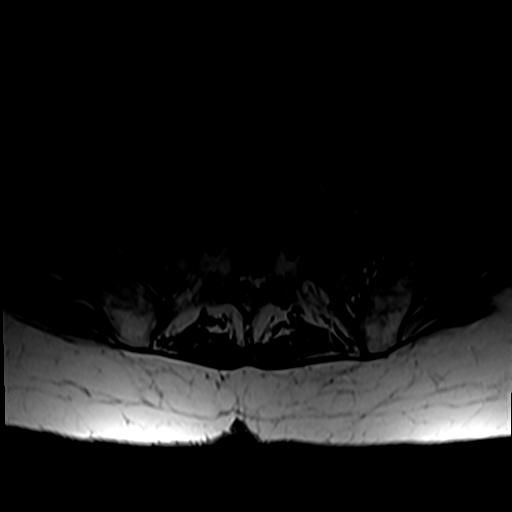
[im 6/39]
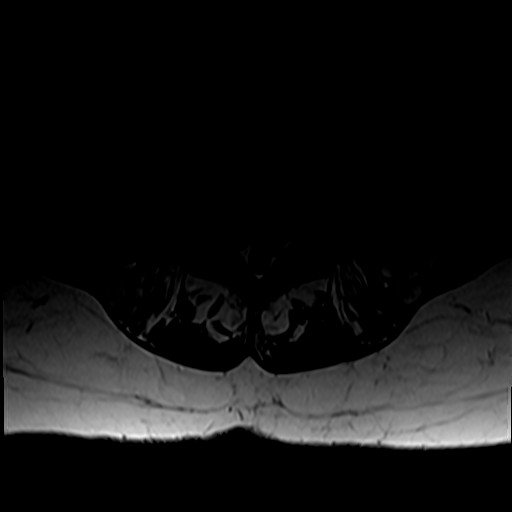
[im 11/39]
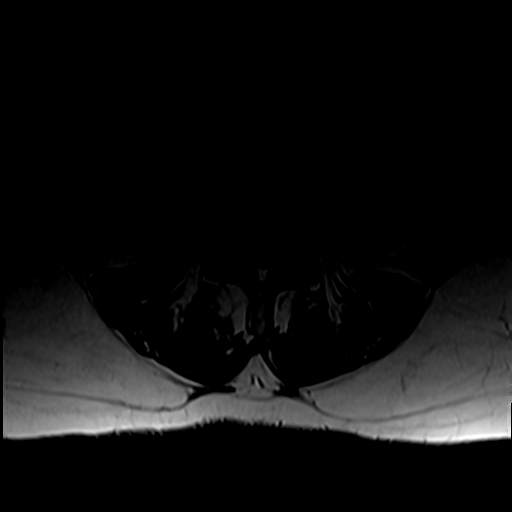
[im 17/39]
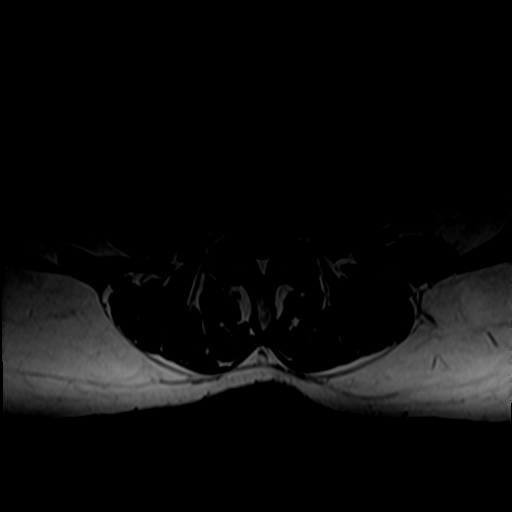
[im 20/39]
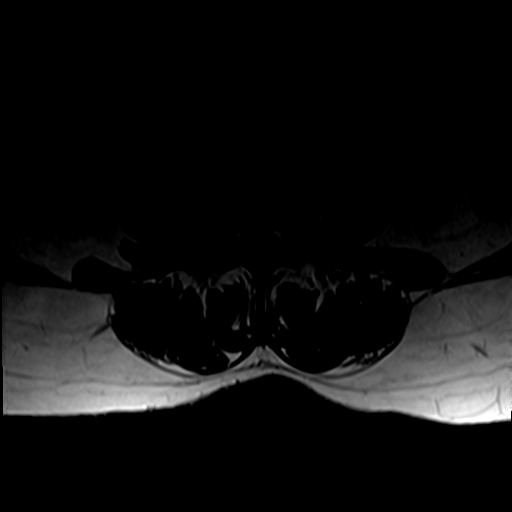
[im 22/39]
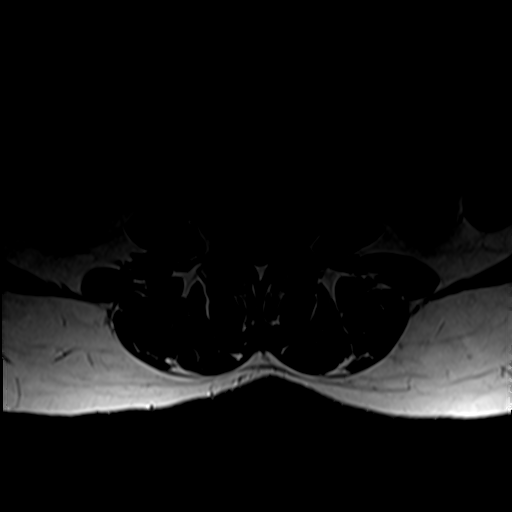
[im 28/39]
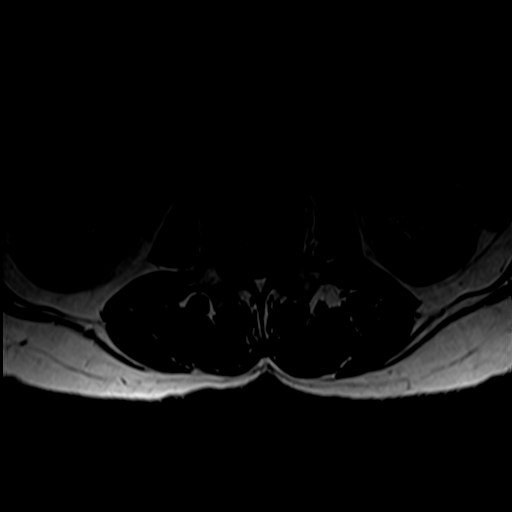
[im 33/39]
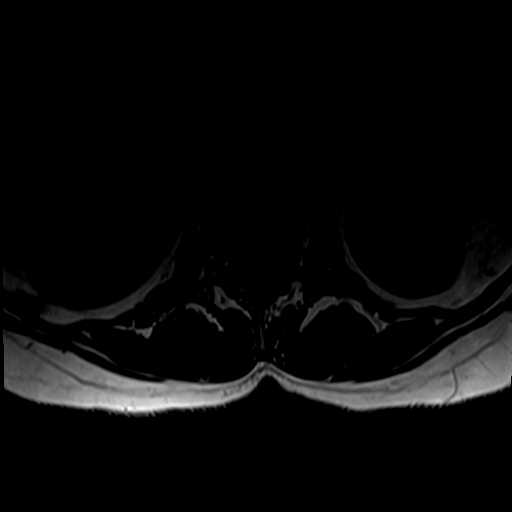
[im 39/39]
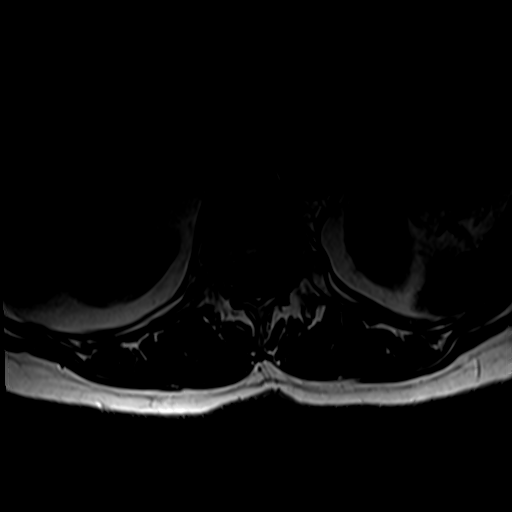

[36 of 48 positions shown; findings below may reference images not displayed]

FINDINGS: Segmentation: Last fully open disk space is labeled L5-S1. Present
examination incorporates from T11-12 disc space through the lower
sacrum.

Alignment:  Mild curvature lumbar spine convex right.

Vertebrae:  No worrisome abnormality.

Conus medullaris: Extends to the lower L1 level and appears normal.

Paraspinal and other soft tissues: Abdominal aortic aneurysm L3
level measuring up to 2.7 cm. Just above this level the aorta
measures 1.5 cm. This is without significant change. Probable left
renal cyst.

Disc levels:

T11-12: Negative.

T12-L1:  Negative.

L1-2:  Minimal Schmorl's node deformity inferior endplate L1.

L2-3:  Minimal Schmorl's node deformity superior endplate L3.

L3-4: Baseline mild spinal stenosis secondary to short pedicles.
Bulge slightly greater to left. Slight impression left ventral
thecal sac. Minimal facet degenerative changes.

L4-5: Baseline spinal stenosis secondary to short pedicles. Bulge
greatest centrally. Facet degenerative changes. Mild ligamentum
flavum hypertrophy. Multifactorial mild to moderate spinal stenosis
and moderate bilateral foraminal narrowing.

L5-S1: Rudimentary disc. Slightly transitional appearance. No
significant spinal stenosis or foraminal narrowing.
IMPRESSION: L4-5 multifactorial mild to moderate spinal stenosis and moderate
bilateral foraminal narrowing without significant change.

L3-4 baseline mild spinal stenosis secondary to short pedicles.
Bulge has progressed slightly since prior exam and is greater to
left. Slight impression left ventral thecal sac.

Abdominal aortic aneurysm L3 level measuring up to 2.7 cm. Just
above this level the aorta measures 1.5 cm. This is without
significant change.

## 2019-04-06 ENCOUNTER — Other Ambulatory Visit: Payer: Self-pay | Admitting: Family Medicine

## 2019-04-06 DIAGNOSIS — Z1231 Encounter for screening mammogram for malignant neoplasm of breast: Secondary | ICD-10-CM

## 2019-04-19 ENCOUNTER — Ambulatory Visit
Admission: RE | Admit: 2019-04-19 | Discharge: 2019-04-19 | Disposition: A | Discharge status: Home / Self Care | Payer: Medicare Other | Source: Ambulatory Visit | Attending: Family Medicine | Admitting: Family Medicine

## 2019-04-19 ENCOUNTER — Other Ambulatory Visit: Payer: Self-pay

## 2019-04-19 DIAGNOSIS — Z1231 Encounter for screening mammogram for malignant neoplasm of breast: Secondary | ICD-10-CM

## 2020-03-21 ENCOUNTER — Other Ambulatory Visit: Payer: Self-pay | Admitting: Family Medicine

## 2020-03-21 DIAGNOSIS — R1110 Vomiting, unspecified: Secondary | ICD-10-CM

## 2020-03-21 DIAGNOSIS — K219 Gastro-esophageal reflux disease without esophagitis: Secondary | ICD-10-CM

## 2020-03-29 ENCOUNTER — Other Ambulatory Visit (HOSPITAL_COMMUNITY): Payer: Self-pay | Admitting: Family Medicine

## 2020-03-29 DIAGNOSIS — Z1231 Encounter for screening mammogram for malignant neoplasm of breast: Secondary | ICD-10-CM

## 2020-04-02 ENCOUNTER — Ambulatory Visit
Admission: RE | Admit: 2020-04-02 | Discharge: 2020-04-02 | Disposition: A | Discharge status: Home / Self Care | Payer: Medicare Other | Source: Ambulatory Visit | Attending: Family Medicine | Admitting: Family Medicine

## 2020-04-02 ENCOUNTER — Other Ambulatory Visit: Payer: Self-pay

## 2020-04-02 DIAGNOSIS — K219 Gastro-esophageal reflux disease without esophagitis: Secondary | ICD-10-CM

## 2020-04-02 DIAGNOSIS — R1110 Vomiting, unspecified: Secondary | ICD-10-CM | POA: Diagnosis present

## 2020-04-05 ENCOUNTER — Other Ambulatory Visit: Payer: Self-pay | Admitting: Family Medicine

## 2020-04-05 DIAGNOSIS — R1110 Vomiting, unspecified: Secondary | ICD-10-CM

## 2020-04-05 DIAGNOSIS — K219 Gastro-esophageal reflux disease without esophagitis: Secondary | ICD-10-CM

## 2020-04-09 ENCOUNTER — Telehealth: Payer: Self-pay | Admitting: Radiology

## 2020-05-18 ENCOUNTER — Ambulatory Visit (HOSPITAL_COMMUNITY): Payer: Medicare Other

## 2020-06-08 ENCOUNTER — Other Ambulatory Visit: Payer: Self-pay | Admitting: Family Medicine

## 2020-06-08 DIAGNOSIS — M8580 Other specified disorders of bone density and structure, unspecified site: Secondary | ICD-10-CM

## 2020-07-10 ENCOUNTER — Ambulatory Visit
Admission: RE | Admit: 2020-07-10 | Discharge: 2020-07-10 | Disposition: A | Discharge status: Home / Self Care | Payer: Medicare Other | Source: Ambulatory Visit | Attending: Family Medicine | Admitting: Family Medicine

## 2020-07-10 ENCOUNTER — Other Ambulatory Visit: Payer: Self-pay

## 2020-07-10 DIAGNOSIS — Z1231 Encounter for screening mammogram for malignant neoplasm of breast: Secondary | ICD-10-CM | POA: Insufficient documentation

## 2020-07-10 DIAGNOSIS — M8580 Other specified disorders of bone density and structure, unspecified site: Secondary | ICD-10-CM

## 2020-07-10 DIAGNOSIS — Z780 Asymptomatic menopausal state: Secondary | ICD-10-CM | POA: Insufficient documentation

## 2020-07-10 DIAGNOSIS — Z13820 Encounter for screening for osteoporosis: Secondary | ICD-10-CM | POA: Insufficient documentation

## 2021-05-23 ENCOUNTER — Encounter: Payer: Self-pay | Admitting: General Surgery

## 2021-05-24 ENCOUNTER — Ambulatory Visit
Admission: RE | Admit: 2021-05-24 | Discharge: 2021-05-24 | Disposition: A | Discharge status: Home / Self Care | Payer: Medicare Other | Attending: General Surgery | Admitting: General Surgery

## 2021-05-24 ENCOUNTER — Ambulatory Visit: Payer: Medicare Other | Admitting: Anesthesiology

## 2021-05-24 ENCOUNTER — Encounter: Payer: Self-pay | Admitting: General Surgery

## 2021-05-24 ENCOUNTER — Encounter
Admission: RE | Disposition: A | Discharge status: Home / Self Care | Payer: Self-pay | Source: Home / Self Care | Attending: General Surgery

## 2021-05-24 DIAGNOSIS — Z79899 Other long term (current) drug therapy: Secondary | ICD-10-CM | POA: Insufficient documentation

## 2021-05-24 DIAGNOSIS — Z884 Allergy status to anesthetic agent status: Secondary | ICD-10-CM | POA: Insufficient documentation

## 2021-05-24 DIAGNOSIS — Z87891 Personal history of nicotine dependence: Secondary | ICD-10-CM | POA: Insufficient documentation

## 2021-05-24 DIAGNOSIS — Z1211 Encounter for screening for malignant neoplasm of colon: Secondary | ICD-10-CM | POA: Insufficient documentation

## 2021-05-24 DIAGNOSIS — K317 Polyp of stomach and duodenum: Secondary | ICD-10-CM | POA: Insufficient documentation

## 2021-05-24 DIAGNOSIS — D124 Benign neoplasm of descending colon: Secondary | ICD-10-CM | POA: Insufficient documentation

## 2021-05-24 DIAGNOSIS — D123 Benign neoplasm of transverse colon: Secondary | ICD-10-CM | POA: Diagnosis not present

## 2021-05-24 DIAGNOSIS — K449 Diaphragmatic hernia without obstruction or gangrene: Secondary | ICD-10-CM | POA: Insufficient documentation

## 2021-05-24 DIAGNOSIS — Z791 Long term (current) use of non-steroidal anti-inflammatories (NSAID): Secondary | ICD-10-CM | POA: Insufficient documentation

## 2021-05-24 DIAGNOSIS — K219 Gastro-esophageal reflux disease without esophagitis: Secondary | ICD-10-CM | POA: Diagnosis not present

## 2021-05-24 DIAGNOSIS — K621 Rectal polyp: Secondary | ICD-10-CM | POA: Insufficient documentation

## 2021-05-24 DIAGNOSIS — K222 Esophageal obstruction: Secondary | ICD-10-CM | POA: Insufficient documentation

## 2021-05-24 HISTORY — DX: Peptic ulcer, site unspecified, unspecified as acute or chronic, without hemorrhage or perforation: K27.9

## 2021-05-24 HISTORY — PX: ESOPHAGOGASTRODUODENOSCOPY: SHX5428

## 2021-05-24 HISTORY — DX: Abdominal aortic aneurysm, without rupture, unspecified: I71.40

## 2021-05-24 HISTORY — PX: COLONOSCOPY WITH PROPOFOL: SHX5780

## 2021-05-24 HISTORY — DX: Lesion of sciatic nerve, unspecified lower limb: G57.00

## 2021-05-24 HISTORY — DX: Abdominal aortic aneurysm, without rupture: I71.4

## 2021-05-24 HISTORY — DX: Hypo-osmolality and hyponatremia: E87.1

## 2021-05-24 HISTORY — DX: Hypokalemia: E87.6

## 2021-05-24 HISTORY — DX: Lymphocytic colitis: K52.832

## 2021-05-24 HISTORY — DX: Radiculopathy, lumbar region: M54.16

## 2021-05-24 SURGERY — EGD (ESOPHAGOGASTRODUODENOSCOPY)
Anesthesia: Monitor Anesthesia Care

## 2021-05-24 MED ORDER — PROPOFOL 500 MG/50ML IV EMUL
INTRAVENOUS | Status: AC
  Filled 2021-05-24: qty 50

## 2021-05-24 MED ORDER — LIDOCAINE HCL (CARDIAC) PF 100 MG/5ML IV SOSY
PREFILLED_SYRINGE | INTRAVENOUS | Status: DC | PRN
  Administered 2021-05-24: 40 mg via INTRAVENOUS

## 2021-05-24 MED ORDER — ONDANSETRON HCL 4 MG/2ML IJ SOLN
4.0000 mg | Freq: Once | INTRAMUSCULAR | Status: DC | PRN
Start: 2021-05-24 — End: 2021-05-24

## 2021-05-24 MED ORDER — EPHEDRINE 5 MG/ML INJ
INTRAVENOUS | Status: AC
  Filled 2021-05-24: qty 5

## 2021-05-24 MED ORDER — EPHEDRINE SULFATE 50 MG/ML IJ SOLN
INTRAMUSCULAR | Status: DC | PRN
  Administered 2021-05-24: 10 mg via INTRAVENOUS
  Administered 2021-05-24: 5 mg via INTRAVENOUS

## 2021-05-24 MED ORDER — SODIUM CHLORIDE 0.9 % IV SOLN: INTRAVENOUS | Status: DC

## 2021-05-24 MED ORDER — PROPOFOL 10 MG/ML IV BOLUS
INTRAVENOUS | Status: DC | PRN
  Administered 2021-05-24: 5 mg via INTRAVENOUS
  Administered 2021-05-24: 70 mg via INTRAVENOUS

## 2021-05-24 MED ORDER — PROPOFOL 500 MG/50ML IV EMUL
INTRAVENOUS | Status: DC | PRN
  Administered 2021-05-24: 150 ug/kg/min via INTRAVENOUS

## 2021-05-24 NOTE — Anesthesia Postprocedure Evaluation (Signed)
Anesthesia Post Note  Patient: Cheryl [name]  Procedure(s) Performed: ESOPHAGOGASTRODUODENOSCOPY (EGD) COLONOSCOPY WITH PROPOFOL  Patient location during evaluation: PACU Anesthesia Type: MAC Level of consciousness: awake Pain management: pain level controlled Vital Signs Assessment: post-procedure vital signs reviewed and stable Respiratory status: spontaneous breathing Cardiovascular status: blood pressure returned to baseline Postop Assessment: no headache Anesthetic complications: no   No notable events documented.   Last Vitals:  Vitals:   05/24/21 0913 05/24/21 0920  BP: 127/77 138/69  Pulse: (!) 59 62  Resp: 15 17  Temp:    SpO2: 97% 94%    Last Pain:  Vitals:   05/24/21 0856  TempSrc: Tympanic  PainSc:                  Milinda Pointer

## 2021-05-24 NOTE — Anesthesia Procedure Notes (Signed)
Date/Time: 05/24/2021 8:06 AM Performed by: Doreen Salvage, CRNA Pre-anesthesia Checklist: Patient identified, Emergency Drugs available, Suction available and Patient being monitored Patient Re-evaluated:Patient Re-evaluated prior to induction Oxygen Delivery Method: Nasal cannula Induction Type: IV induction Dental Injury: Teeth and Oropharynx as per pre-operative assessment  Comments: Nasal cannula with etCO2 monitoring

## 2021-05-24 NOTE — Op Note (Signed)
[facility] Patient Name: Cheryl [name] Procedure Date: 05/24/2021 8:02 AM MRN: [id number] Account #: [id number] Date of Birth: July 15, 1946 Admit Type: Outpatient Age: 75 Room: [facility] ENDO ROOM 1 Gender: Female Note Status: Finalized Procedure:             Colonoscopy Indications:           High risk colon cancer surveillance: Personal history                         of colonic polyps Providers:             Robert Bellow, MD Referring MD:          Denton Lank MD, MD (Referring MD) Medicines:             Propofol per Anesthesia Complications:         No immediate complications. Procedure:             Pre-Anesthesia Assessment:                        - Prior to the procedure, a History and Physical was                         performed, and patient medications, allergies and                         sensitivities were reviewed. The patient's tolerance                         of previous anesthesia was reviewed.                        - The risks and benefits of the procedure and the                         sedation options and risks were discussed with the                         patient. All questions were answered and informed                         consent was obtained.                        After obtaining informed consent, the colonoscope was                         passed under direct vision. Throughout the procedure,                         the patient's blood pressure, pulse, and oxygen                         saturations were monitored continuously. The                         Colonoscope was introduced through the anus and                         advanced to the the terminal  ileum. The colonoscopy                         was performed without difficulty. The patient                         tolerated the procedure well. The quality of the bowel                         preparation was excellent. Findings:      A 4 mm polyp was found in  the rectum descending colon hepatic flexure.       The polyp was sessile. Biopsies were taken with a cold forceps for       histology.      Two sessile polyps were found in the transverse colon and mid transverse       colon. The polyps were 6 mm in size. These polyps were removed with a       hot snare. Resection and retrieval were complete.      The terminal ileum appeared normal. Impression:            - One 4 mm polyp in the rectum in the descending colon                         at the hepatic flexure. Biopsied.                        - Two 6 mm polyps in the transverse colon and in the                         mid transverse colon, removed with a hot snare.                         Resected and retrieved.                        - The examined portion of the ileum was normal. Recommendation:        - Telephone endoscopist for pathology results in 1                         week. Procedure Code(s):     --- Professional ---                        [phone], Colonoscopy, flexible; with removal of                         tumor(s), polyp(s), or other lesion(s) by snare                         technique                        [id number], 35, Colonoscopy, flexible; with biopsy, single                         or multiple Diagnosis Code(s):     --- Professional ---  Z86.010, Personal history of colonic polyps                        K62.1, Rectal polyp                        K63.5, Polyp of colon CPT copyright 2019 American Medical Association. All rights reserved. The codes documented in this report are preliminary and upon coder review may  be revised to meet current compliance requirements. Robert Bellow, MD 05/24/2021 8:55:41 AM This report has been signed electronically. Number of Addenda: 0 Note Initiated On: 05/24/2021 8:02 AM Scope Withdrawal Time: 0 hours 18 minutes 57 seconds  Total Procedure Duration: 0 hours 29 minutes 0 seconds  Estimated Blood Loss:  Estimated  blood loss: none. Estimated blood loss was                         minimal.      [facility]

## 2021-05-24 NOTE — Anesthesia Preprocedure Evaluation (Addendum)
Anesthesia Evaluation  Patient identified by MRN, date of birth, ID band Patient awake    Reviewed: Allergy & Precautions, H&P , NPO status , Patient's Chart, lab work & pertinent test results  History of Anesthesia Complications (+) history of anesthetic complications  Airway Mallampati: II  TM Distance: >3 FB Neck ROM: full    Dental no notable dental hx.    Pulmonary former smoker,    Pulmonary exam normal - rhonchi (-) wheezing      Cardiovascular hypertension,  Rhythm:Regular Rate:Normal - Systolic murmurs and - Diastolic murmurs    Neuro/Psych PSYCHIATRIC DISORDERS Depression  Neuromuscular disease    GI/Hepatic Neg liver ROS, PUD,   Endo/Other  negative endocrine ROS  Renal/GU negative Renal ROS  negative genitourinary   Musculoskeletal  (+) Arthritis ,   Abdominal Normal abdominal exam  (+)   Peds  Hematology negative hematology ROS (+)   Anesthesia Other Findings   Reproductive/Obstetrics                            Anesthesia Physical  Anesthesia Plan  ASA: 2  Anesthesia Plan: MAC   Post-op Pain Management:    Induction: Intravenous  PONV Risk Score and Plan: Propofol infusion and TIVA  Airway Management Planned: Mask  Additional Equipment:   Intra-op Plan:   Post-operative Plan:   Informed Consent: I have reviewed the patients History and Physical, chart, labs and discussed the procedure including the risks, benefits and alternatives for the proposed anesthesia with the patient or authorized representative who has indicated his/her understanding and acceptance.       Plan Discussed with: CRNA  Anesthesia Plan Comments: (IVGA, PIV x 1, ASA SM, RBO discussed, Consent signed.)       Anesthesia Quick Evaluation

## 2021-05-24 NOTE — Transfer of Care (Signed)
Immediate Anesthesia Transfer of Care Note  Patient: Cheryl [name]  Procedure(s) Performed: Procedure(s): ESOPHAGOGASTRODUODENOSCOPY (EGD) (N/A) COLONOSCOPY WITH PROPOFOL (N/A)  Patient Location: PACU and Endoscopy Unit  Anesthesia Type:General  Level of Consciousness: sedated  Airway & Oxygen Therapy: Patient Spontanous Breathing and Patient connected to nasal cannula oxygen  Post-op Assessment: Report given to RN and Post -op Vital signs reviewed and stable  Post vital signs: Reviewed and stable  Last Vitals:  Vitals:   05/24/21 0910 05/24/21 0913  BP:  127/77  Pulse: (!) 58 (!) 59  Resp: 15 15  Temp:    SpO2: [id number] [id number]    Complications: No apparent anesthesia complications

## 2021-05-24 NOTE — H&P (Signed)
Cheryl [name] [id number] 26-Feb-1946     HPI:  Healthy 75 y/o woman with a history of constipation and GE reflux. Polyp in 2016.  For upper and lower endoscopy.  Tolerated prep well.   Facility-Administered Medications Prior to Admission  Medication Dose Route Frequency Provider Last Rate Last Admin   bupivacaine (MARCAINE) 0.5 % injection 30 mL  30 mL Other Once Mohammed Kindle, MD       lactated ringers infusion 1,000 mL  1,000 mL Intravenous Continuous Mohammed Kindle, MD       lidocaine (PF) (XYLOCAINE) 1 % injection 10 mL  10 mL Subcutaneous Once Mohammed Kindle, MD       sodium chloride 0.9 % injection 20 mL  20 mL Other Once Mohammed Kindle, MD       Medications Prior to Admission  Medication Sig Dispense Refill Last Dose   atorvastatin (LIPITOR) 20 MG tablet Take 20 mg by mouth at bedtime.   05/23/2021   azelastine (ASTELIN) 0.1 % nasal spray Place 2 sprays into both nostrils 2 (two) times daily. Use in each nostril as directed   05/23/2021   buPROPion (WELLBUTRIN SR) 150 MG 12 hr tablet Take 150 mg by mouth daily.    05/23/2021   buPROPion (WELLBUTRIN XL) 150 MG 24 hr tablet    05/23/2021   busPIRone (BUSPAR) 5 MG tablet Take 5 mg by mouth 2 (two) times daily.   05/23/2021   calcium-vitamin D (OSCAL WITH D) 250-125 MG-UNIT tablet Take 1 tablet by mouth daily.   05/23/2021   esterified estrogens (MENEST) 0.625 MG tablet Take 0.625 mg by mouth daily.    05/23/2021   losartan-hydrochlorothiazide (HYZAAR) 50-12.5 MG per tablet Take 1 tablet by mouth daily.    05/23/2021   meclizine (ANTIVERT) 25 MG tablet Take 25 mg by mouth 2 (two) times daily as needed.    05/23/2021   pantoprazole (PROTONIX) 40 MG tablet Take 40 mg by mouth daily.    05/23/2021   triamcinolone cream (KENALOG) 0.1 % Apply topically.   05/23/2021   alendronate (FOSAMAX) 70 MG tablet Take 70 mg by mouth. (Patient not taking: Reported on 05/24/2021)   Not Taking   cetirizine (ZYRTEC) 10 MG tablet Take 10 mg by mouth daily.  (Patient  not taking: Reported on 05/24/2021)   Not Taking   diclofenac sodium (VOLTAREN) 1 % GEL Apply 2 g topically 3 (three) times daily as needed.  (Patient not taking: Reported on 05/24/2021)   Not Taking   gabapentin (NEURONTIN) 600 MG tablet Take 600 mg by mouth 2 (two) times daily. As needed (Patient not taking: Reported on 05/24/2021)   Not Taking   oxyCODONE-acetaminophen (PERCOCET) 5-325 MG tablet 1 tablet every 4 - 6 hours prn pain (Patient not taking: Reported on 05/24/2021) 20 tablet 0 Not Taking   No Known Allergies Past Medical History:  Diagnosis Date   Abdominal aneurysm (HCC)    Allergy    seasonal   Arthritis    hips   Cataract    Complication of anesthesia    facial swelling after hysterectomy   Depression    Gastric ulcer 10/27/2010   Hypertension    Hypokalemia    Hyponatremia    Lymphocytic colitis    Osteopenia    Peptic ulcer    Piriformis syndrome    Radiculopathy of lumbar region    Ulcerative colitis ([place])    Vertigo    with sinus issues   Wears dentures    partial  upper   Past Surgical History:  Procedure Laterality Date   ABDOMINAL HYSTERECTOMY     APPENDECTOMY     BREAST BIOPSY Left 2012   negative   CATARACT EXTRACTION, BILATERAL Bilateral 2005   COLONOSCOPY  2012   COLONOSCOPY WITH PROPOFOL N/A 10/11/2015   Procedure: COLONOSCOPY WITH PROPOFOL;  Surgeon: Lucilla Lame, MD;  Location: [place];  Service: Endoscopy;  Laterality: N/A;   POLYPECTOMY  10/11/2015   Procedure: POLYPECTOMY;  Surgeon: Lucilla Lame, MD;  Location: [place];  Service: Endoscopy;;   Social History   Socioeconomic History   Marital status: Single    Spouse name: Not on file   Number of children: Not on file   Years of education: Not on file   Highest education level: Not on file  Occupational History   Not on file  Tobacco Use   Smoking status: Some Days    Years: 40.00    Types: Cigarettes   Smokeless tobacco: Never  Vaping Use   Vaping Use:  Never used  Substance and Sexual Activity   Alcohol use: Yes    Alcohol/week: 3.0 standard drinks    Types: 3 Glasses of wine per week   Drug use: No   Sexual activity: Never  Other Topics Concern   Not on file  Social History Narrative   Not on file   Social Determinants of Health   Financial Resource Strain: Not on file  Food Insecurity: Not on file  Transportation Needs: Not on file  Physical Activity: Not on file  Stress: Not on file  Social Connections: Not on file  Intimate Partner Violence: Not on file   Social History   Social History Narrative   Not on file     ROS: Negative.     PE: HEENT: Negative. Lungs: Clear. Cardio: RR.   Assessment/Plan:  Proceed with planned endoscopy.    Forest Gleason [facility] 05/24/2021

## 2021-05-24 NOTE — Op Note (Signed)
[facility] Gastroenterology Patient Name: Cheryl [name] Procedure Date: 05/24/2021 8:03 AM MRN: [id number] Account #: [id number] Date of Birth: [birth year] Admit Type: Outpatient Age: 75 Room: [facility] ENDO ROOM 1 Gender: Female Note Status: Finalized Procedure:             Upper GI endoscopy Indications:           Dysphagia, Suspected gastro-esophageal reflux disease Providers:             Robert Bellow, MD Referring MD:          Denton Lank MD, MD (Referring MD) Medicines:             Propofol per Anesthesia Complications:         No immediate complications. Procedure:             Pre-Anesthesia Assessment:                        - Prior to the procedure, a History and Physical was                         performed, and patient medications, allergies and                         sensitivities were reviewed. The patient's tolerance                         of previous anesthesia was reviewed.                        - The risks and benefits of the procedure and the                         sedation options and risks were discussed with the                         patient. All questions were answered and informed                         consent was obtained.                        After obtaining informed consent, the endoscope was                         passed under direct vision. Throughout the procedure,                         the patient's blood pressure, pulse, and oxygen                         saturations were monitored continuously. The Endoscope                         was introduced through the mouth, and advanced to the                         second part of duodenum. The upper GI endoscopy was  accomplished without difficulty. The patient tolerated                         the procedure well. Findings:      A non-obstructing Schatzki ring was found at the gastroesophageal       junction.      A small hiatal hernia was present.       A single 4 mm sessile polyp with no bleeding was found in the second       portion of the duodenum. Biopsies were taken with a cold forceps for       histology. Impression:            - Non-obstructing Schatzki ring.                        - Small hiatal hernia.                        - A single duodenal polyp. Biopsied. Recommendation:        - Telephone endoscopist for pathology results in 1                         week.                        - Perform a colonoscopy today. Procedure Code(s):     --- Professional ---                        [phone], Esophagogastroduodenoscopy, flexible,                         transoral; with biopsy, single or multiple Diagnosis Code(s):     --- Professional ---                        K22.2, Esophageal obstruction                        K44.9, Diaphragmatic hernia without obstruction or                         gangrene                        K31.7, Polyp of stomach and duodenum                        R13.10, Dysphagia, unspecified CPT copyright 2019 American Medical Association. All rights reserved. The codes documented in this report are preliminary and upon coder review may  be revised to meet current compliance requirements. Robert Bellow, MD 05/24/2021 8:20:30 AM This report has been signed electronically. Number of Addenda: 0 Note Initiated On: 05/24/2021 8:03 AM Estimated Blood Loss:  Estimated blood loss: none.      [facility]

## 2021-05-27 LAB — SURGICAL PATHOLOGY

## 2021-06-12 ENCOUNTER — Emergency Department: Payer: Medicare Other

## 2021-06-12 ENCOUNTER — Emergency Department
Admission: EM | Admit: 2021-06-12 | Discharge: 2021-06-12 | Disposition: A | Discharge status: Home / Self Care | Payer: Medicare Other | Attending: Emergency Medicine | Admitting: Emergency Medicine

## 2021-06-12 ENCOUNTER — Other Ambulatory Visit: Payer: Self-pay

## 2021-06-12 DIAGNOSIS — IMO0001 Reserved for inherently not codable concepts without codable children: Secondary | ICD-10-CM

## 2021-06-12 DIAGNOSIS — I10 Essential (primary) hypertension: Secondary | ICD-10-CM | POA: Insufficient documentation

## 2021-06-12 DIAGNOSIS — L259 Unspecified contact dermatitis, unspecified cause: Secondary | ICD-10-CM | POA: Diagnosis not present

## 2021-06-12 DIAGNOSIS — F17210 Nicotine dependence, cigarettes, uncomplicated: Secondary | ICD-10-CM | POA: Diagnosis not present

## 2021-06-12 DIAGNOSIS — R059 Cough, unspecified: Secondary | ICD-10-CM | POA: Diagnosis present

## 2021-06-12 DIAGNOSIS — R911 Solitary pulmonary nodule: Secondary | ICD-10-CM | POA: Diagnosis not present

## 2021-06-12 DIAGNOSIS — R0602 Shortness of breath: Secondary | ICD-10-CM | POA: Diagnosis not present

## 2021-06-12 DIAGNOSIS — U071 COVID-19: Secondary | ICD-10-CM | POA: Insufficient documentation

## 2021-06-12 DIAGNOSIS — J209 Acute bronchitis, unspecified: Secondary | ICD-10-CM | POA: Insufficient documentation

## 2021-06-12 DIAGNOSIS — Z79899 Other long term (current) drug therapy: Secondary | ICD-10-CM | POA: Insufficient documentation

## 2021-06-12 DIAGNOSIS — Z85038 Personal history of other malignant neoplasm of large intestine: Secondary | ICD-10-CM | POA: Diagnosis not present

## 2021-06-12 LAB — CBC
HCT: 37.8 % (ref 36.0–46.0)
Hemoglobin: 13.4 g/dL (ref 12.0–15.0)
MCH: 31.3 pg (ref 26.0–34.0)
MCHC: 35.4 g/dL (ref 30.0–36.0)
MCV: 88.3 fL (ref 80.0–100.0)
Platelets: 222 10*3/uL (ref 150–400)
RBC: 4.28 MIL/uL (ref 3.87–5.11)
RDW: 13.7 % (ref 11.5–15.5)
WBC: 8.1 10*3/uL (ref 4.0–10.5)
nRBC: 0 % (ref 0.0–0.2)

## 2021-06-12 LAB — RESP PANEL BY RT-PCR (FLU A&B, COVID) ARPGX2
Influenza A by PCR: NEGATIVE
Influenza B by PCR: NEGATIVE
SARS Coronavirus 2 by RT PCR: POSITIVE — AB

## 2021-06-12 LAB — BASIC METABOLIC PANEL
Anion gap: 9 (ref 5–15)
BUN: 14 mg/dL (ref 8–23)
CO2: 25 mmol/L (ref 22–32)
Calcium: 8.6 mg/dL — ABNORMAL LOW (ref 8.9–10.3)
Chloride: 99 mmol/L (ref 98–111)
Creatinine, Ser: 0.61 mg/dL (ref 0.44–1.00)
GFR, Estimated: >60 mL/min (ref >60)
Glucose, Bld: 78 mg/dL (ref 70–99)
Potassium: 3.7 mmol/L (ref 3.5–5.1)
Sodium: 133 mmol/L — ABNORMAL LOW (ref 135–145)

## 2021-06-12 LAB — D-DIMER, QUANTITATIVE: D-Dimer, Quant: 1.86 ug/mL-FEU — ABNORMAL HIGH (ref 0.00–0.50)

## 2021-06-12 MED ORDER — AMOXICILLIN-POT CLAVULANATE 875-125 MG PO TABS
1.0000 | ORAL_TABLET | Freq: Once | ORAL | Status: AC
  Administered 2021-06-12: 1 via ORAL
  Filled 2021-06-12: qty 1

## 2021-06-12 MED ORDER — IOHEXOL 350 MG/ML SOLN
75.0000 mL | Freq: Once | INTRAVENOUS | Status: AC | PRN
  Administered 2021-06-12: 75 mL via INTRAVENOUS
  Filled 2021-06-12: qty 75

## 2021-06-12 MED ORDER — METHYLPREDNISOLONE 4 MG PO TBPK: ORAL_TABLET | ORAL | 0 refills | Status: DC

## 2021-06-12 MED ORDER — AZITHROMYCIN 500 MG PO TABS
500.0000 mg | ORAL_TABLET | Freq: Once | ORAL | Status: AC
  Administered 2021-06-12: 500 mg via ORAL
  Filled 2021-06-12: qty 1

## 2021-06-12 MED ORDER — AMOXICILLIN-POT CLAVULANATE 875-125 MG PO TABS: 1.0000 | ORAL_TABLET | Freq: Two times a day (BID) | ORAL | 0 refills | Status: DC

## 2021-06-12 MED ORDER — IPRATROPIUM-ALBUTEROL 0.5-2.5 (3) MG/3ML IN SOLN
3.0000 mL | Freq: Once | RESPIRATORY_TRACT | Status: AC
  Administered 2021-06-12: 3 mL via RESPIRATORY_TRACT
  Filled 2021-06-12: qty 3

## 2021-06-12 MED ORDER — PREDNISONE 20 MG PO TABS
40.0000 mg | ORAL_TABLET | Freq: Once | ORAL | Status: AC
  Administered 2021-06-12: 40 mg via ORAL
  Filled 2021-06-12: qty 2

## 2021-06-12 MED ORDER — AZITHROMYCIN 250 MG PO TABS: ORAL_TABLET | ORAL | 0 refills | Status: DC

## 2021-06-12 NOTE — ED Provider Notes (Signed)
[facility] Emergency Department Provider Note   ____________________________________________   Event Date/Time   First MD Initiated Contact with Patient 06/12/21 1347     (approximate)  I have reviewed the triage vital signs and the nursing notes.   HISTORY  Chief Complaint Cough and Rash    HPI Cheryl [name] is a 75 y.o. female with a history of previous gastric ulcer hypertension hyponatremia lichen planus dermatitis estrogen replacement  Patient reports that she had a endoscopy about 2 weeks ago, but 2 days later she started noticed she is having cough mostly dry.  No fever.  No chills.  Cough is fairly nonproductive, also associated with a slight wheezing feeling.  Keeps her up at night difficulty sleeping.  Also has noticed in the last 2 weeks itching and redness and a slight rash over the back of her left elbow and a little bit across her forearm.  She reports she has had this rash before, and doctors of previously thought it might be related to some sort of dermatitis that she acquires from her garden or plants.  She reports that she is taken prednisone weeklong course for that in the past and the rashes resolved, and it appeared basically the same.  It started as a slightly bubbled very itchy red rash and now the blisters have gone away but the skin remains very itchy.  No chest pain.  No leg swelling.  No pain in her calf or thighs.  Denies any history of blood clots.  Denies feeling short of breath, but reports she is continue to have this cough for almost 2 weeks now and thinks it would get a whole lot better if she got treatment with steroid that might also help with the rash as well.  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[place])    Vertigo    with sinus issues   Wears dentures    partial upper    Patient Active Problem List   Diagnosis Date Noted   Chronic diarrhea of unknown origin    Benign neoplasm of ascending colon    Benign neoplasm of cecum    Spinal stenosis, lumbar region, with neurogenic claudication 09/06/2015   DDD (degenerative disc disease), lumbar 03/30/2015   Lumbar facet arthropathy 03/30/2015   Lymphocytic colitis 03/27/2015   Degenerative lumbar disc 03/21/2015   Facet syndrome, lumbar 03/21/2015   Lumbar radiculopathy 03/21/2015   Sacroiliac joint dysfunction 03/21/2015   CD (contact dermatitis) 03/23/2014   Foot pain 03/23/2014   Fracture, stress, metatarsal 03/23/2014    Past Surgical History:  Procedure Laterality Date   ABDOMINAL HYSTERECTOMY     APPENDECTOMY     BREAST BIOPSY Left 2012   negative   CATARACT EXTRACTION, BILATERAL Bilateral  2005   COLONOSCOPY  2012   COLONOSCOPY WITH PROPOFOL N/A 10/11/2015   Procedure: COLONOSCOPY WITH PROPOFOL;  Surgeon: Lucilla Lame, MD;  Location: [place];  Service: Endoscopy;  Laterality: N/A;   COLONOSCOPY WITH PROPOFOL N/A 05/24/2021   Procedure: COLONOSCOPY WITH PROPOFOL;  Surgeon: Robert Bellow, MD;  Location: [facility] ENDOSCOPY;  Service: Gastroenterology;  Laterality: N/A;   ESOPHAGOGASTRODUODENOSCOPY N/A 05/24/2021   Procedure: ESOPHAGOGASTRODUODENOSCOPY (EGD);  Surgeon: Robert Bellow, MD;  Location: [facility] ENDOSCOPY;  Service: Gastroenterology;  Laterality: N/A;   POLYPECTOMY  10/11/2015   Procedure: POLYPECTOMY;  Surgeon: Lucilla Lame, MD;  Location: [place];   Service: Endoscopy;;    Prior to Admission medications   Medication Sig Start Date End Date Taking? Authorizing Provider  amoxicillin-clavulanate (AUGMENTIN) 875-125 MG tablet Take 1 tablet by mouth 2 (two) times daily. 06/12/21  Yes Delman Kitten, MD  azithromycin (ZITHROMAX) 250 MG tablet Take 1 tablet PO daily for 4 more days starting the day after your visit to the Emergency Department 06/12/21  Yes Delman Kitten, MD  methylPREDNISolone (MEDROL DOSEPAK) 4 MG TBPK tablet Use as directed on Medrol dose pack. 06/12/21  Yes Delman Kitten, MD  alendronate (FOSAMAX) 70 MG tablet Take 70 mg by mouth. Patient not taking: Reported on 05/24/2021    [provider]  atorvastatin (LIPITOR) 20 MG tablet Take 20 mg by mouth at bedtime.    [provider]  azelastine (ASTELIN) 0.1 % nasal spray Place 2 sprays into both nostrils 2 (two) times daily. Use in each nostril as directed    [provider]  buPROPion (WELLBUTRIN SR) 150 MG 12 hr tablet Take 150 mg by mouth daily.     [provider]  buPROPion (WELLBUTRIN XL) 150 MG 24 hr tablet  09/04/15   [provider]  busPIRone (BUSPAR) 5 MG tablet Take 5 mg by mouth 2 (two) times daily.    [provider]  calcium-vitamin D (OSCAL WITH D) 250-125 MG-UNIT tablet Take 1 tablet by mouth daily.    [provider]  cetirizine (ZYRTEC) 10 MG tablet Take 10 mg by mouth daily.     [provider]  diclofenac sodium (VOLTAREN) 1 % GEL Apply 2 g topically 3 (three) times daily as needed.  Patient not taking: Reported on 05/24/2021    [provider]  esterified estrogens (MENEST) 0.625 MG tablet Take 0.625 mg by mouth daily.     [provider]  gabapentin (NEURONTIN) 600 MG tablet Take 600 mg by mouth 2 (two) times daily. As needed Patient not taking: Reported on 05/24/2021    [provider]  losartan-hydrochlorothiazide (HYZAAR) 50-12.5 MG per tablet Take 1 tablet by mouth  daily.     [provider]  meclizine (ANTIVERT) 25 MG tablet Take 25 mg by mouth 2 (two) times daily as needed.     [provider]  oxyCODONE-acetaminophen (PERCOCET) 5-325 MG tablet 1 tablet every 4 - 6 hours prn pain Patient not taking: Reported on 05/24/2021 03/11/17   Johnn Hai, PA-C  pantoprazole (PROTONIX) 40 MG tablet Take 40 mg by mouth daily.     [provider]  triamcinolone cream (KENALOG) 0.1 % Apply topically.    [provider]    Allergies Patient has no known allergies.  Family History  Problem Relation Age of Onset   Arthritis Mother    Diabetes Mother    Depression Mother    COPD Mother    Heart  disease Mother    Hypertension Mother    Alcohol abuse Father    Stroke Father    Stroke Paternal Aunt    Breast cancer Maternal Grandmother     Social History Social History   Tobacco Use   Smoking status: Some Days    Years: 40.00    Types: Cigarettes   Smokeless tobacco: Never  Vaping Use   Vaping Use: Never used  Substance Use Topics   Alcohol use: Yes    Alcohol/week: 3.0 standard drinks    Types: 3 Glasses of wine per week   Drug use: No    Review of Systems Constitutional: No fever/chills Eyes: No visual changes. ENT: No sore throat. Cardiovascular: Denies chest pain. Respiratory: Denies shortness of breath.  See HPI Gastrointestinal: No abdominal pain.   Genitourinary: Negative for dysuria. Musculoskeletal: Negative for back pain. Skin: See HPI regarding rash on left forearm, denies any other rashes Neurological: Negative for headaches, areas of focal weakness or numbness.    ____________________________________________   PHYSICAL EXAM:  VITAL SIGNS: ED Triage Vitals [06/12/21 1131]  Enc Vitals Group     BP 131/88     Pulse Rate 71     Resp 18     Temp 98.5 F (36.9 C)     Temp Source Oral     SpO2 100 %     Weight 138 lb 14.2 oz (63 kg)     Height 5' (1.524 m)     Head Circumference       Peak Flow      Pain Score 0     Pain Loc      Pain Edu?      Excl. in [place]?     Constitutional: Alert and oriented. Well appearing and in no acute distress.  She does have a frequent cough, but in no distress.  She is very pleasant.  Here with her fianc who is here and supportive Eyes: Conjunctivae are normal. Head: Atraumatic. Nose: No congestion/rhinnorhea. Mouth/Throat: Mucous membranes are moist. Neck: No stridor.  Cardiovascular: Normal rate, regular rhythm. Grossly normal heart sounds.  Good peripheral circulation. Respiratory: Normal respiratory effort.  No retractions.  Patient has mild end expiratory wheezing.  Her work of breathing is normal.  Deep inspiration elicits a dry cough.  No focal rales or crackles are noted.  Mild to moderate and expiratory wheezing noted throughout. Gastrointestinal: Soft and nontender. No distention. Musculoskeletal: No lower extremity tenderness nor edema. Neurologic:  Normal speech and language. No gross focal neurologic deficits are appreciated.  Skin:  Skin is warm, dry and intact. No rash noted except over the volar surface of her proximal left forearm and around the backside towards the elbow she has a slightly erythematous almost plaque-like excoriated rash without induration drainage or ulceration noted appears somewhat scaly. Psychiatric: Mood and affect are normal. Speech and behavior are normal.  ____________________________________________   LABS (all labs ordered are listed, but only abnormal results are displayed)  Labs Reviewed  RESP PANEL BY RT-PCR (FLU A&B, COVID) ARPGX2 - Abnormal; Notable for the following components:      Result Value   SARS Coronavirus 2 by RT PCR POSITIVE (*)    All other components within normal limits  BASIC METABOLIC PANEL - Abnormal; Notable for the following components:   Sodium 133 (*)    Calcium 8.6 (*)    All other components within normal limits  D-DIMER, QUANTITATIVE - Abnormal; Notable  for the following components:  D-Dimer, Quant 1.86 (*)    All other components within normal limits  CBC   ____________________________________________  EKG  Reviewed inter by me at 1140 Heart rate 79 QRS 89 QTc 440 Normal sinus rhythm, mild ST segment abnormality including minimal depression and inversion in V2, slight depression in lateral precordial leads as well as lead 2, 3, avf. Though distant, is similar in morphology to 11/22/2010 ekg ____________________________________________  RADIOLOGY  DG Chest 2 View  Result Date: 06/12/2021 CLINICAL DATA:  Shortness of breath, productive cough sore throat in a 75 year old female. EXAM: CHEST - 2 VIEW COMPARISON:  April 02, 2020. FINDINGS: The heart size and mediastinal contours are within normal limits. Both lungs are clear. The visualized skeletal structures are unremarkable. IMPRESSION: No acute cardiopulmonary disease. Electronically Signed   By: Zetta Bills M.D.   On: 06/12/2021 12:27   CT Angio Chest PE W and/or Wo Contrast  Result Date: 06/12/2021 CLINICAL DATA:  Dyspnea. EXAM: CT ANGIOGRAPHY CHEST WITH CONTRAST TECHNIQUE: Multidetector CT imaging of the chest was performed using the standard protocol during bolus administration of intravenous contrast. Multiplanar CT image reconstructions and MIPs were obtained to evaluate the vascular anatomy. CONTRAST:  97m OMNIPAQUE IOHEXOL 350 MG/ML SOLN COMPARISON:  None. FINDINGS: Cardiovascular: Satisfactory opacification of the pulmonary arteries to the segmental level. No evidence of pulmonary embolism. Normal heart size. No pericardial effusion. Atherosclerosis of thoracic aorta is noted without aneurysm or dissection. Mediastinum/Nodes: Small sliding-type hiatal hernia is noted. No adenopathy is noted. Thyroid gland is unremarkable. Lungs/Pleura: No pneumothorax or pleural effusion is noted. 7 mm nodule is seen in right middle lobe best seen on image number 52 of series 6. Focal airspace  opacity is noted laterally in the right upper lobe concerning for possible pneumonia or atelectasis. Probable atelectasis or infiltrate is seen in right infrahilar region. Severe bronchial wall thickening is seen involving bilateral lower lobe bronchi consistent with bronchitis or possibly aspirated material. Upper Abdomen: Cholelithiasis is noted. Musculoskeletal: No chest wall abnormality. No acute or significant osseous findings. Review of the MIP images confirms the above findings. IMPRESSION: No definite evidence of pulmonary embolus. Focal airspace opacity is noted laterally in right upper lobe concerning for possible pneumonia or atelectasis. Severe bronchial wall thickening is seen in several bilateral lower lobe bronchi consistent with bronchitis or possibly aspirated material. 7 mm right middle lobe nodule is noted. Non-contrast chest CT at 6-12 months is recommended. If the nodule is stable at time of repeat CT, then future CT at 18-24 months (from today's scan) is considered optional for low-risk patients, but is recommended for high-risk patients. This recommendation follows the consensus statement: Guidelines for Management of Incidental Pulmonary Nodules Detected on CT Images: From the Fleischner Society 2017; Radiology 2017; 284:228-243. Probable atelectasis or infiltrate is seen in right infrahilar region. Cholelithiasis. Aortic Atherosclerosis (ICD10-I70.0). Electronically Signed   By: JMarijo ConceptionM.D.   On: 06/12/2021 17:37     CT imaging reviewed, notable for atelectasis or infiltrate, nodule, bronchial wall thickening. ____________________________________________   PROCEDURES  Procedure(s) performed: None  Procedures  Critical Care performed: No  ____________________________________________   INITIAL IMPRESSION / ASSESSMENT AND PLAN / ED COURSE  Pertinent labs & imaging results that were available during my care of the patient were reviewed by me and considered in my  medical decision making (see chart for details).   1) rash.  Doubt it is related to her cough, reports similar and has been treated for plant like dermatitis in the  past and reports a history it sounds almost like a poison ivy like presentation.  We will treat with steroid, has a dermatologist whom she can follow-up with next week at [facility]  She is already contacted them but they are just not in town to see her this week.  I do not see evidence of superinfection or systemic symptoms  2) cough.  Symptoms seem most consistent with bronchitis possibly some mild underlying COPD given her long-term history of smoking and presentation.  We will treat with azithromycin, steroid, nebulizer treatment.  Chest x-ray negative for infiltrate.  Afebrile normal white count doubt clinically pneumonia.  No cardiac symptoms.  EKG at baseline.  Will send D-dimer to evaluate for PE, 1-1/2 points by Wells PE criteria low risk, send D-dimer for exclusion given her recent operative care and endoscopy.  ----------------------------------------- 6:01 PM on 06/12/2021 ----------------------------------------- Patient resting comfortably at this time.  She feels improved work of breathing normal in no distress.  Patient feels well and comfortable with plan for discharge.  She certainly has reassuring evaluation exam at this time, work of breathing normal no distress no wheezing  While we will discharged home, placed on steroid taper, azithromycin and Augmentin.  Careful return precautions discussed.  Also discussed lung nodule and recommended 6 to 12 months surveillance which she will discuss with primary care.  Discussed return precautions on her rash and she will contact [facility] dermatology for close follow-up and continue to monitor that as well.        ____________________________________________   FINAL CLINICAL IMPRESSION(S) / ED DIAGNOSES  Final diagnoses:  Lung nodule < 6cm on CT  COVID-19  Bronchospasm with  bronchitis, acute  Contact dermatitis, unspecified contact dermatitis type, unspecified trigger        Note:  This document was prepared using Dragon voice recognition software and may include unintentional dictation errors       Delman Kitten, MD 06/12/21 1802

## 2021-06-12 NOTE — Discharge Instructions (Addendum)
Please follow-up closely with your primary doctor.  In addition to treatment recommendations we discussed around COVID 19 and bronchitis, also follow-up with Dr. Marolyn Hammock your dermatologist.  Please also notify your primary care doctor that you have a lung nodule and we recommend a repeat CT scan of the chest without contrast in about 6 to 12 months time for monitoring.

## 2021-06-12 NOTE — ED Triage Notes (Signed)
Pt to ED for productive cough, sore throat and rash that started a week and half ago.  States has had rash before but not this bad  Also reports she has shob and generalized weakness that started with other sx as well. Pt ambulatory, NAD noted

## 2021-08-13 ENCOUNTER — Other Ambulatory Visit: Payer: Self-pay

## 2021-08-13 ENCOUNTER — Encounter: Payer: Self-pay | Admitting: *Deleted

## 2021-08-13 ENCOUNTER — Emergency Department
Admission: EM | Admit: 2021-08-13 | Discharge: 2021-08-13 | Disposition: A | Discharge status: Home / Self Care | Payer: Medicare Other | Attending: Emergency Medicine | Admitting: Emergency Medicine

## 2021-08-13 DIAGNOSIS — I10 Essential (primary) hypertension: Secondary | ICD-10-CM | POA: Diagnosis not present

## 2021-08-13 DIAGNOSIS — F17210 Nicotine dependence, cigarettes, uncomplicated: Secondary | ICD-10-CM | POA: Insufficient documentation

## 2021-08-13 DIAGNOSIS — R21 Rash and other nonspecific skin eruption: Secondary | ICD-10-CM | POA: Diagnosis not present

## 2021-08-13 DIAGNOSIS — Z79899 Other long term (current) drug therapy: Secondary | ICD-10-CM | POA: Insufficient documentation

## 2021-08-13 MED ORDER — MUPIROCIN CALCIUM 2 % EX CREA: 1.0000 "application " | TOPICAL_CREAM | Freq: Two times a day (BID) | CUTANEOUS | 0 refills | Status: DC

## 2021-08-13 MED ORDER — HYDROXYZINE HCL 10 MG/5ML PO SYRP
25.0000 mg | ORAL_SOLUTION | Freq: Three times a day (TID) | ORAL | Status: DC | PRN
  Filled 2021-08-13 (×2): qty 12.5

## 2021-08-13 MED ORDER — DEXAMETHASONE SODIUM PHOSPHATE 10 MG/ML IJ SOLN
10.0000 mg | Freq: Once | INTRAMUSCULAR | Status: AC
  Administered 2021-08-13: 10 mg via INTRAMUSCULAR
  Filled 2021-08-13: qty 1

## 2021-08-13 MED ORDER — METHYLPREDNISOLONE 4 MG PO TBPK: ORAL_TABLET | ORAL | 0 refills | Status: DC

## 2021-08-13 MED ORDER — HYDROXYZINE HCL 10 MG/5ML PO SYRP: 10.0000 mg | ORAL_SOLUTION | Freq: Three times a day (TID) | ORAL | 0 refills | Status: DC | PRN

## 2021-08-13 NOTE — Discharge Instructions (Addendum)
Read and follow discharge care instruction.  Take medication as directed.  If no improvement in 1 week contact the dermatology clinic  on your discharge care instruction.

## 2021-08-13 NOTE — ED Provider Notes (Signed)
[facility] Emergency Department Provider Note   ____________________________________________   Event Date/Time   First MD Initiated Contact with Patient 08/13/21 1730     (approximate)  I have reviewed the triage vital signs and the nursing notes.   HISTORY  Chief Complaint Rash    HPI Cheryl [name] is a 75 y.o. female Patient presents with 5 days of diffuse rash with Intense itching.Patient denies new laundry products, personal hygiene products, or food.  Patient also has edema and erythema to the left lower leg.        Past Medical History:  Diagnosis Date   Abdominal aneurysm    Allergy    seasonal   Arthritis    hips   Cataract    Complication of anesthesia    facial swelling after hysterectomy   Depression    Gastric ulcer 10/27/2010   Hypertension    Hypokalemia    Hyponatremia    Lymphocytic colitis    Osteopenia    Peptic ulcer    Piriformis syndrome    Radiculopathy of lumbar region    Ulcerative colitis ([place])    Vertigo    with sinus issues   Wears dentures    partial upper    Patient Active Problem List   Diagnosis Date Noted   Chronic diarrhea of unknown origin    Benign neoplasm of ascending colon    Benign neoplasm of cecum    Spinal stenosis, lumbar region, with neurogenic claudication 09/06/2015   DDD (degenerative disc disease), lumbar 03/30/2015   Lumbar facet arthropathy 03/30/2015   Lymphocytic colitis 03/27/2015   Degenerative lumbar disc 03/21/2015   Facet syndrome, lumbar 03/21/2015   Lumbar radiculopathy 03/21/2015   Sacroiliac joint dysfunction 03/21/2015   CD (contact dermatitis) 03/23/2014   Foot pain 03/23/2014   Fracture, stress, metatarsal 03/23/2014    Past Surgical History:  Procedure Laterality Date   ABDOMINAL HYSTERECTOMY     APPENDECTOMY     BREAST BIOPSY Left 2012   negative   CATARACT EXTRACTION, BILATERAL Bilateral 2005   COLONOSCOPY  2012   COLONOSCOPY WITH PROPOFOL N/A  10/11/2015   Procedure: COLONOSCOPY WITH PROPOFOL;  Surgeon: Lucilla Lame, MD;  Location: [place];  Service: Endoscopy;  Laterality: N/A;   COLONOSCOPY WITH PROPOFOL N/A 05/24/2021   Procedure: COLONOSCOPY WITH PROPOFOL;  Surgeon: Robert Bellow, MD;  Location: [facility] ENDOSCOPY;  Service: Gastroenterology;  Laterality: N/A;   ESOPHAGOGASTRODUODENOSCOPY N/A 05/24/2021   Procedure: ESOPHAGOGASTRODUODENOSCOPY (EGD);  Surgeon: Robert Bellow, MD;  Location: [facility] ENDOSCOPY;  Service: Gastroenterology;  Laterality: N/A;   POLYPECTOMY  10/11/2015   Procedure: POLYPECTOMY;  Surgeon: Lucilla Lame, MD;  Location: [place];  Service: Endoscopy;;    Prior to Admission medications   Medication Sig Start Date End Date Taking? Authorizing Provider  hydrOXYzine (ATARAX) 10 MG/5ML syrup Take 5 mLs (10 mg total) by mouth 3 (three) times daily as needed for itching. 08/13/21  Yes Sable Feil, PA-C  methylPREDNISolone (MEDROL DOSEPAK) 4 MG TBPK tablet Take Tapered dose as directed 08/13/21  Yes Sable Feil, PA-C  alendronate (FOSAMAX) 70 MG tablet Take 70 mg by mouth. Patient not taking: Reported on 05/24/2021    [provider]  amoxicillin-clavulanate (AUGMENTIN) 875-125 MG tablet Take 1 tablet by mouth 2 (two) times daily. 06/12/21   Delman Kitten, MD  atorvastatin (LIPITOR) 20 MG tablet Take 20 mg by mouth at bedtime.    [provider]  azelastine (ASTELIN) 0.1 %  nasal spray Place 2 sprays into both nostrils 2 (two) times daily. Use in each nostril as directed    [provider]  azithromycin (ZITHROMAX) 250 MG tablet Take 1 tablet PO daily for 4 more days starting the day after your visit to the Emergency Department 06/12/21   Delman Kitten, MD  buPROPion [facility] SR) 150 MG 12 hr tablet Take 150 mg by mouth daily.     [provider]  buPROPion (WELLBUTRIN XL) 150 MG 24 hr tablet  09/04/15   [provider]  busPIRone (BUSPAR) 5 MG  tablet Take 5 mg by mouth 2 (two) times daily.    [provider]  calcium-vitamin D (OSCAL WITH D) 250-125 MG-UNIT tablet Take 1 tablet by mouth daily.    [provider]  cetirizine (ZYRTEC) 10 MG tablet Take 10 mg by mouth daily.     [provider]  diclofenac sodium (VOLTAREN) 1 % GEL Apply 2 g topically 3 (three) times daily as needed.  Patient not taking: Reported on 05/24/2021    [provider]  esterified estrogens (MENEST) 0.625 MG tablet Take 0.625 mg by mouth daily.     [provider]  gabapentin (NEURONTIN) 600 MG tablet Take 600 mg by mouth 2 (two) times daily. As needed Patient not taking: Reported on 05/24/2021    [provider]  losartan-hydrochlorothiazide (HYZAAR) 50-12.5 MG per tablet Take 1 tablet by mouth daily.     [provider]  meclizine (ANTIVERT) 25 MG tablet Take 25 mg by mouth 2 (two) times daily as needed.     [provider]  methylPREDNISolone (MEDROL DOSEPAK) 4 MG TBPK tablet Use as directed on Medrol dose pack. 06/12/21   Delman Kitten, MD  oxyCODONE-acetaminophen (PERCOCET) 5-325 MG tablet 1 tablet every 4 - 6 hours prn pain Patient not taking: Reported on 05/24/2021 03/11/17   Johnn Hai, PA-C  pantoprazole (PROTONIX) 40 MG tablet Take 40 mg by mouth daily.     [provider]  triamcinolone cream (KENALOG) 0.1 % Apply topically.    [provider]    Allergies Patient has no known allergies.  Family History  Problem Relation Age of Onset   Arthritis Mother    Diabetes Mother    Depression Mother    COPD Mother    Heart disease Mother    Hypertension Mother    Alcohol abuse Father    Stroke Father    Stroke Paternal Aunt    Breast cancer Maternal Grandmother     Social History Social History   Tobacco Use   Smoking status: Some Days    Years: 40.00    Types: Cigarettes   Smokeless tobacco: Never  Vaping Use   Vaping Use: Never used  Substance  Use Topics   Alcohol use: Yes    Alcohol/week: 3.0 standard drinks    Types: 3 Glasses of wine per week   Drug use: No    Review of Systems  Constitutional: No fever/chills Eyes: No visual changes. ENT: No sore throat. Cardiovascular: Denies chest pain. Respiratory: Denies shortness of breath. Gastrointestinal: GERD.  No abdominal pain.  No nausea, no vomiting.  No diarrhea.  No constipation. Genitourinary: Negative for dysuria. Musculoskeletal: Negative for back pain. Skin: Positive for rash. Neurological: Negative for headaches, focal weakness or numbness. Psychiatric: Depression Endocrine: Hyperlipidemia ____________________________________________   PHYSICAL EXAM:  VITAL SIGNS: ED Triage Vitals  Enc Vitals Group     BP 08/13/21 1717 (!) 115/96  Pulse Rate 08/13/21 1717 93     Resp 08/13/21 1717 17     Temp 08/13/21 1717 98 F (36.7 C)     Temp Source 08/13/21 1717 Oral     SpO2 08/13/21 1717 97 %     Weight 08/13/21 1717 134 lb (60.8 kg)     Height 08/13/21 1717 5' (1.524 m)     Head Circumference --      Peak Flow --      Pain Score 08/13/21 1716 5     Pain Loc --      Pain Edu? --      Excl. in [place]? --     Constitutional: Alert and oriented. Well appearing and in no acute distress. Eyes: Conjunctivae are normal. PERRL. EOMI. Head: Atraumatic. Nose: No congestion/rhinnorhea. Mouth/Throat: Mucous membranes are moist.  Oropharynx non-erythematous. Neck: No stridor.  No cervical spine tenderness to palpation. Hematological/Lymphatic/Immunilogical: No cervical lymphadenopathy. Cardiovascular: Normal rate, regular rhythm. Grossly normal heart sounds.  Good peripheral circulation. Respiratory: Normal respiratory effort.  No retractions. Lungs CTAB. Gastrointestinal: Soft and nontender. No distention. No abdominal bruits. No CVA tenderness. Genitourinary: Deferred usculoskeletal: No lower extremity tenderness nor edema.  No joint effusions. Neurologic:   Normal speech and language. No gross focal neurologic deficits are appreciated. No gait instability. Skin:  Skin is warm, dry and intact.  Diffuse maculopapular lesions sparing the face Psychiatric: Mood and affect are normal. Speech and behavior are normal.  ____________________________________________   LABS (all labs ordered are listed, but only abnormal results are displayed)  Labs Reviewed - No data to display ____________________________________________  EKG   ____________________________________________  RADIOLOGY I, Sable Feil, personally viewed and evaluated these images (plain radiographs) as part of my medical decision making, as well as reviewing the written report by the radiologist.  ED MD interpretation:    Official radiology report(s): No results found.  ____________________________________________   PROCEDURES  Procedure(s) performed (including Critical Care):  Procedures   ____________________________________________   INITIAL IMPRESSION / ASSESSMENT AND PLAN / ED COURSE  As part of my medical decision making, I reviewed the following data within the [place]               ____________________________________________   FINAL CLINICAL IMPRESSION(S) / ED DIAGNOSES  Final diagnoses:  Rash and nonspecific skin eruption     ED Discharge Orders          Ordered    methylPREDNISolone (MEDROL DOSEPAK) 4 MG TBPK tablet        08/13/21 1809    hydrOXYzine (ATARAX) 10 MG/5ML syrup  3 times daily PRN        08/13/21 1809             Note:  This document was prepared using Dragon voice recognition software and may include unintentional dictation errors.    Sable Feil, PA-C 08/13/21 1850    Harvest Dark, MD 08/13/21 [phone]

## 2021-08-13 NOTE — ED Triage Notes (Signed)
Pt has itching rash all over body for 4 days  no otc meds.  No resp distress  pt alert  speech clear.

## 2022-08-20 ENCOUNTER — Encounter: Payer: Self-pay | Admitting: Dermatology

## 2022-08-20 ENCOUNTER — Ambulatory Visit (INDEPENDENT_AMBULATORY_CARE_PROVIDER_SITE_OTHER): Payer: Medicare Other | Admitting: Dermatology

## 2022-08-20 DIAGNOSIS — R21 Rash and other nonspecific skin eruption: Secondary | ICD-10-CM | POA: Diagnosis not present

## 2022-08-20 MED ORDER — PERMETHRIN 5 % EX CREA: TOPICAL_CREAM | CUTANEOUS | 1 refills | Status: DC

## 2022-08-20 MED ORDER — TRIAMCINOLONE ACETONIDE 0.1 % EX CREA
TOPICAL_CREAM | CUTANEOUS | 0 refills | Status: DC
Start: 2022-08-20 — End: 2023-05-21

## 2022-08-20 NOTE — Progress Notes (Signed)
   New Patient Visit  Subjective  Cheryl [name] is a 76 y.o. female who presents for the following: New Patient (Initial Visit) (Patient here today concerning itchy spots at legs and arms. 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 Patient states she has been dealing with for over 6 years. Patient has tried triamcinolone, clobetasol cream, and other topicals to help. Reports spots still dont go away. ).  The following portions of the chart were reviewed this encounter and updated as appropriate:   Tobacco  Allergies  Meds  Problems  Med Hx  Surg Hx  Fam Hx      Review of Systems:  No other skin or systemic complaints except as noted in HPI or Assessment and Plan.  Objective  Well appearing patient in no apparent distress; mood and affect are within normal limits.  A focused examination was performed including the face, abdomen, and extremities. Relevant physical exam findings are noted in the Assessment and Plan.    Assessment & Plan  Rash and other nonspecific skin eruption B/L lower legs, arms, hands, arms  Scabies prep notable for scybala (scabies fecal matter)  May have possible underlying atopic dermatitis or other rash as well based on history  Discussed highly contagious condition.  All household members/close contacts should be treated at same time.  After treatment with topical Elimite cream applied as directed overnight (and/or oral ivermectin), wash all bed sheets, towels, and recently worn clothing in hot water, and dry on high heat.  Repeat entire process in one week.  Start Permethrin cream apply everywhere from the neck down. Let sit over night and wash off in the morning. Repeat in one week. Advised patient that family members should be treated as well and prescription sent in for patient's fiance.   Plan to treat any underlying rash issues at follow up appointment after scabies treatment.    permethrin (ELIMITE) 5 %  cream - B/L lower legs, arms, hands, arms Apply everywhere from the neck down let sit over night and was off in the morning. Repeat in one week.  triamcinolone cream (KENALOG) 0.1 % - B/L lower legs, arms, hands, arms Apply to itchy areas QD-BID PRN. Avoid applying to face, groin, and axilla. Use as directed. Long-term use can cause thinning of the skin.    Return for rash follow up in 1-2 mths.   I, Ruthell Rummage, CMA, am acting as scribe for Forest Gleason, MD.  I, Rudell Cobb, CMA, am acting as scribe for Forest Gleason, MD .  Documentation: I have reviewed the above documentation for accuracy and completeness, and I agree with the above.  Forest Gleason, MD

## 2022-08-20 NOTE — Patient Instructions (Addendum)
Discussed highly contagious condition.  All household members/close contacts should be treated at same time.  After treatment with topical Elimite cream applied as directed overnight (and/or oral ivermectin), wash all bed sheets, towels, and recently worn clothing in hot water, and dry on high heat.  Repeat entire process in one week.    Scabies is a skin condition that happens when very small insects called mites get under the skin (infestation). This causes a rash and severe itchiness. Scabies is contagious, which means it can spread from person to person. If you get scabies, it is common for others in your household to get scabies too. With proper treatment, symptoms usually go away in 2-4 weeks. Scabies usually does not cause lasting problems. What are the causes? This condition is caused by tiny mites (Sarcoptes scabiei, or human itch mites) that can only be seen with a microscope. The mites get into the top layer of skin and lay eggs. Scabies can spread from person to person through: Close contact with a person who has scabies. Sharing or having contact with infested items, such as towels, bedding, or clothing. What increases the risk? The following factors may make you more likely to develop this condition: Living in a nursing home or other extended care facility. Having sexual contact with a partner who has scabies. Caring for others who are at increased risk for scabies. What are the signs or symptoms? Symptoms of this condition include: Severe itchiness. This is often worse at night. A rash that includes tiny red bumps or blisters. The rash commonly occurs on the hands, wrists, elbows, armpits, chest, waist, groin, or buttocks. The bumps may form a line (burrow) in some areas. Skin irritation. This can include scaly patches or sores. How is this diagnosed? This condition may be diagnosed based on: A physical exam of the skin. A skin test. Your health care provider may take a sample  of your affected skin (skin scraping) and have it examined under a microscope for signs of mites. How is this treated? This condition may be treated with: Medicated cream or lotion that kills the mites. This is spread on the entire body and left on for several hours. Usually, one treatment with medicated cream or lotion is enough to kill all the mites. In severe cases, the treatment may need to be repeated. Medicated cream that relieves itching. Medicines taken by mouth (orally) that: Relieve itching. Reduce the swelling and redness. Kill the mites. This treatment may be done in severe cases. Follow these instructions at home: Medicines Take or apply over-the-counter and prescription medicines only as told by your health care provider. Apply medicated cream or lotion as told by your health care provider. Do not wash off the medicated cream or lotion until the necessary amount of time has passed. Skin care Avoid scratching the affected areas of your skin. Keep your fingernails closely trimmed to reduce injury from scratching. Take cool baths or apply cool washcloths to your skin to help reduce itching. General instructions Clean all items that you had contact with during the 3 days before diagnosis. This includes bedding, clothing, towels, and furniture. Do this on the same day that you start treatment. Dry-clean items, or use hot water to wash items. Dry items on the hot dry cycle. Place items that cannot be washed into closed, airtight plastic bags for at least 3 days. The mites cannot live for more than 3 days away from human skin. Vacuum furniture and mattresses that you use. Make  sure that other people who may have been infested are examined by a health care provider. These include members of your household and anyone who may have had contact with infested items. Keep all follow-up visits. This is important. Where to find more information Centers for Disease Control and Prevention:  http://www.wolf.info/ Contact a health care provider if: You have itching that does not go away after 4 weeks of treatment. You continue to develop new bumps or burrows. You have redness, swelling, or pain in your rash area after treatment. You have fluid, blood, or pus coming from your rash. Summary Scabies is a skin condition that causes a rash and severe itchiness. This condition is caused by tiny mites that get into the top layer of the skin and lay eggs. Scabies can spread from person to person. Follow treatments as recommended by your health care provider. Clean all items that you recently had contact with. This information is not intended to replace advice given to you by your health care provider. Make sure you discuss any questions you have with your health care provider. Document Revised: 02/10/2020 Document Reviewed: 02/10/2020 Elsevier Patient Education  [place].     Due to recent changes in healthcare laws, you may see results of your pathology and/or laboratory studies on MyChart before the doctors have had a chance to review them. We understand that in some cases there may be results that are confusing or concerning to you. Please understand that not all results are received at the same time and often the doctors may need to interpret multiple results in order to provide you with the best plan of care or course of treatment. Therefore, we ask that you please give [place] 2 business days to thoroughly review all your results before contacting the office for clarification. Should we see a critical lab result, you will be contacted sooner.   If You Need Anything After Your Visit  If you have any questions or concerns for your doctor, please call our main line at [phone] and press option 4 to reach your doctor's medical assistant. If no one answers, please leave a voicemail as directed and we will return your call as soon as possible. Messages left after 4 pm will be answered the  following business day.   You may also send [place] a message via [place]. We typically respond to MyChart messages within 1-2 business days.  For prescription refills, please ask your pharmacy to contact our office. Our fax number is [phone].  If you have an urgent issue when the clinic is closed that cannot wait until the next business day, you can page your doctor at the number below.    Please note that while we do our best to be available for urgent issues outside of office hours, we are not available 24/7.   If you have an urgent issue and are unable to reach [place], you may choose to seek medical care at your doctor's office, retail clinic, urgent care center, or emergency room.  If you have a medical emergency, please immediately call 911 or go to the emergency department.  Pager Numbers  - Dr. Nehemiah Massed: [phone]  - Dr. Laurence Ferrari: [phone]  - Dr. Nicole Kindred: [phone]  In the event of inclement weather, please call our main line at [phone] for an update on the status of any delays or closures.  Dermatology Medication Tips: Please keep the boxes that topical medications come in in order to help keep track of the instructions  about where and how to use these. Pharmacies typically print the medication instructions only on the boxes and not directly on the medication tubes.   If your medication is too expensive, please contact our office at [phone] option 4 or send [place] a message through [place].   We are unable to tell what your co-pay for medications will be in advance as this is different depending on your insurance coverage. However, we may be able to find a substitute medication at lower cost or fill out paperwork to get insurance to cover a needed medication.   If a prior authorization is required to get your medication covered by your insurance company, please allow [place] 1-2 business days to complete this process.  Drug prices often vary depending on where the  prescription is filled and some pharmacies may offer cheaper prices.  The website www.goodrx.com contains coupons for medications through different pharmacies. The prices here do not account for what the cost may be with help from insurance (it may be cheaper with your insurance), but the website can give you the price if you did not use any insurance.  - You can print the associated coupon and take it with your prescription to the pharmacy.  - You may also stop by our office during regular business hours and pick up a GoodRx coupon card.  - If you need your prescription sent electronically to a different pharmacy, notify our office through [facility] or by phone at [phone] option 4.     Si Usted Necesita Algo Despus de Su Visita  Tambin puede enviarnos un mensaje a travs de Pharmacist, community. Por lo general respondemos a los mensajes de MyChart en el transcurso de 1 a 2 das hbiles.  Para renovar recetas, por favor pida a su farmacia que se ponga en contacto con nuestra oficina. Harland Dingwall de fax es [place] [phone].  Si tiene un asunto urgente cuando la clnica est cerrada y que no puede esperar hasta el siguiente da hbil, puede llamar/localizar a su doctor(a) al nmero que aparece a continuacin.   Por favor, tenga en cuenta que aunque hacemos todo lo posible para estar disponibles para asuntos urgentes fuera del horario de [place], no estamos disponibles las 24 horas del da, los 7 das de la [place].   Si tiene un problema urgente y no puede comunicarse con nosotros, puede optar por buscar atencin mdica  en el consultorio de su doctor(a), en una clnica privada, en un centro de atencin urgente o en una sala de emergencias.  Si tiene Engineering geologist, por favor llame inmediatamente al 911 o vaya a la sala de emergencias.  Nmeros de bper  - Dr. Nehemiah Massed: [phone]  - Dra. Moye: [phone]  - Dra. Nicole Kindred: [phone]  En caso de inclemencias del [place],  por favor llame a Johnsie Kindred principal al [phone] para una actualizacin sobre el [place] de cualquier retraso o cierre.  Consejos para la medicacin en dermatologa: Por favor, guarde las cajas en las que vienen los medicamentos de uso tpico para ayudarle a seguir las instrucciones sobre dnde y cmo usarlos. Las farmacias generalmente imprimen las instrucciones del medicamento slo en las cajas y no directamente en los tubos del [place].   Si su medicamento es muy caro, por favor, pngase en contacto con Zigmund Daniel llamando al [phone] y presione la opcin 4 o envenos un mensaje a travs de Pharmacist, community.   No podemos decirle cul ser su copago por los medicamentos por adelantado ya que  esto es diferente dependiendo de la cobertura de su seguro. Sin embargo, es posible que podamos encontrar un medicamento sustituto a Electrical engineer un formulario para que el seguro cubra el medicamento que se considera necesario.   Si se requiere una autorizacin previa para que su compaa de seguros [place] su medicamento, por favor permtanos de 1 a 2 das hbiles para completar este proceso.  Los precios de los medicamentos varan con frecuencia dependiendo del Environmental consultant de dnde se surte la receta y alguna farmacias pueden ofrecer precios ms baratos.  El sitio web www.goodrx.com tiene cupones para medicamentos de Airline pilot. Los precios aqu no tienen en cuenta lo que podra costar con la ayuda del seguro (puede ser ms barato con su seguro), pero el sitio web puede darle el precio si no utiliz Research scientist (physical sciences).  - Puede imprimir el cupn correspondiente y llevarlo con su receta a la farmacia.  - Tambin puede pasar por nuestra oficina durante el horario de atencin regular y Charity fundraiser una tarjeta de cupones de GoodRx.  - Si necesita que su receta se enve electrnicamente a una farmacia diferente, informe a nuestra oficina a travs de MyChart de [place] o por telfono llamando al  [phone] y presione la opcin 4.

## 2022-09-16 ENCOUNTER — Telehealth: Payer: Self-pay

## 2022-09-16 NOTE — Telephone Encounter (Signed)
Pt scheduled. aw

## 2022-09-16 NOTE — Telephone Encounter (Signed)
Patient was seen yesterday by PCP and prescribed Prednisone for on going rash. Patient is scheduled with you Dec 14th but wants to be seen sooner. Patient's PCP advised not to start Prednisone until speaking with you as the prednisone could throw off a potential biopsy.   When should patient start prednisone?  Do you want her appt moved up?

## 2022-09-16 NOTE — Telephone Encounter (Signed)
Please offer her tomorrow morning 8 am. Thank you

## 2022-09-17 ENCOUNTER — Telehealth: Payer: Self-pay

## 2022-09-17 ENCOUNTER — Ambulatory Visit (INDEPENDENT_AMBULATORY_CARE_PROVIDER_SITE_OTHER): Payer: Medicare Other | Admitting: Dermatology

## 2022-09-17 DIAGNOSIS — R21 Rash and other nonspecific skin eruption: Secondary | ICD-10-CM

## 2022-09-17 MED ORDER — IVERMECTIN 3 MG PO TABS: ORAL_TABLET | ORAL | 1 refills | Status: DC

## 2022-09-17 MED ORDER — PERMETHRIN 5 % EX CREA: TOPICAL_CREAM | CUTANEOUS | 1 refills | Status: DC

## 2022-09-17 NOTE — Telephone Encounter (Signed)
Patient called office regarding a cream PCP gave her fiance for skin cracking. Patient states he has Psorcon Diflorasone 5% cream and she would like to know would she be a good candidate for this?   Patient advised Dr. Laurence Ferrari already gone for the holiday and I will call her next week. aw

## 2022-09-17 NOTE — Progress Notes (Signed)
   Follow-Up Visit   Subjective  Cheryl [name] is a 76 y.o. female who presents for the following: Rash (Patient here today for continuing itching and rash at arms, hands, top of feet and legs. Patient was given prednisone by [facility] and took the first dose last night. Scabies prep done on 10/25 was notable for scybala (scabies fecal matter). She treated with permethrin twice and had significant improvement for a few days but legs did not clear up. Patient advises a history of rash for 5-6 years but more recent worsening.   Patient's fiance does not have any symptoms and would not treat with permethrin.  The following portions of the chart were reviewed this encounter and updated as appropriate:   Tobacco  Allergies  Meds  Problems  Med Hx  Surg Hx  Fam Hx      Review of Systems:  No other skin or systemic complaints except as noted in HPI or Assessment and Plan.  Objective  Well appearing patient in no apparent distress; mood and affect are within normal limits.  A focused examination was performed including arms, legs. Relevant physical exam findings are noted in the Assessment and Plan.  arms, legs Scaly pink papules and plaques    Assessment & Plan  Rash arms, legs  Previous scabies prep showed scybala c/w scabies infection.   She reports near clearance of itch and rash after using permethrin. However, the rash returned. She notes her fiance did not do his permethrin treatment due to cost over $80 at pharmacy.  History and exam c/w scabies reinfection from fiance. Unclear if she has an underlying baseline dermatosis.   Discussed highly contagious condition.  All household members/close contacts should be treated at same time.  After treatment with topical Elimite cream applied as directed overnight (and/or oral ivermectin), wash all bed sheets, towels, and recently worn clothing in hot water, and dry on high heat.  Repeat entire process in one week.  Will  re-treat again and also send in oral ivermectin.  Rash and other nonspecific skin eruption  Related Medications triamcinolone cream (KENALOG) 0.1 % Apply to itchy areas QD-BID PRN. Avoid applying to face, groin, and axilla. Use as directed. Long-term use can cause thinning of the skin.  permethrin (ELIMITE) 5 % cream Apply everywhere from the neck down let sit over night and was off in the morning. Repeat in one week.   Return for 4-6 week .  Graciella Belton, RMA, am acting as scribe for Forest Gleason, MD .  Documentation: I have reviewed the above documentation for accuracy and completeness, and I agree with the above.  Forest Gleason, MD

## 2022-09-17 NOTE — Patient Instructions (Addendum)
Discussed highly contagious condition.  All household members/close contacts should be treated at same time.  After treatment with topical Elimite cream applied as directed overnight (and/or oral ivermectin), wash all bed sheets, towels, and recently worn clothing in hot water, and dry on high heat.  Repeat entire process in one week.    Due to recent changes in healthcare laws, you may see results of your pathology and/or laboratory studies on MyChart before the doctors have had a chance to review them. We understand that in some cases there may be results that are confusing or concerning to you. Please understand that not all results are received at the same time and often the doctors may need to interpret multiple results in order to provide you with the best plan of care or course of treatment. Therefore, we ask that you please give [place] 2 business days to thoroughly review all your results before contacting the office for clarification. Should we see a critical lab result, you will be contacted sooner.   If You Need Anything After Your Visit  If you have any questions or concerns for your doctor, please call our main line at [phone] and press option 4 to reach your doctor's medical assistant. If no one answers, please leave a voicemail as directed and we will return your call as soon as possible. Messages left after 4 pm will be answered the following business day.   You may also send [place] a message via [place]. We typically respond to MyChart messages within 1-2 business days.  For prescription refills, please ask your pharmacy to contact our office. Our fax number is [phone].  If you have an urgent issue when the clinic is closed that cannot wait until the next business day, you can page your doctor at the number below.    Please note that while we do our best to be available for urgent issues outside of office hours, we are not available 24/7.   If you have an urgent issue and are  unable to reach [place], you may choose to seek medical care at your doctor's office, retail clinic, urgent care center, or emergency room.  If you have a medical emergency, please immediately call 911 or go to the emergency department.  Pager Numbers  - Dr. Nehemiah Massed: [phone]  - Dr. Laurence Ferrari: [phone]  - Dr. Nicole Kindred: [phone]  In the event of inclement weather, please call our main line at [phone] for an update on the status of any delays or closures.  Dermatology Medication Tips: Please keep the boxes that topical medications come in in order to help keep track of the instructions about where and how to use these. Pharmacies typically print the medication instructions only on the boxes and not directly on the medication tubes.   If your medication is too expensive, please contact our office at [phone] option 4 or send [place] a message through [place].   We are unable to tell what your co-pay for medications will be in advance as this is different depending on your insurance coverage. However, we may be able to find a substitute medication at lower cost or fill out paperwork to get insurance to cover a needed medication.   If a prior authorization is required to get your medication covered by your insurance company, please allow [place] 1-2 business days to complete this process.  Drug prices often vary depending on where the prescription is filled and some pharmacies may offer cheaper prices.  The website www.goodrx.com contains  coupons for medications through different pharmacies. The prices here do not account for what the cost may be with help from insurance (it may be cheaper with your insurance), but the website can give you the price if you did not use any insurance.  - You can print the associated coupon and take it with your prescription to the pharmacy.  - You may also stop by our office during regular business hours and pick up a GoodRx coupon card.  - If you need your  prescription sent electronically to a different pharmacy, notify our office through [facility] or by phone at [phone] option 4.     Si Usted Necesita Algo Despus de Su Visita  Tambin puede enviarnos un mensaje a travs de Pharmacist, community. Por lo general respondemos a los mensajes de MyChart en el transcurso de 1 a 2 das hbiles.  Para renovar recetas, por favor pida a su farmacia que se ponga en contacto con nuestra oficina. Harland Dingwall de fax es [place] [phone].  Si tiene un asunto urgente cuando la clnica est cerrada y que no puede esperar hasta el siguiente da hbil, puede llamar/localizar a su doctor(a) al nmero que aparece a continuacin.   Por favor, tenga en cuenta que aunque hacemos todo lo posible para estar disponibles para asuntos urgentes fuera del horario de [place], no estamos disponibles las 24 horas del da, los 7 das de la [place].   Si tiene un problema urgente y no puede comunicarse con nosotros, puede optar por buscar atencin mdica  en el consultorio de su doctor(a), en una clnica privada, en un centro de atencin urgente o en una sala de emergencias.  Si tiene Engineering geologist, por favor llame inmediatamente al 911 o vaya a la sala de emergencias.  Nmeros de bper  - Dr. Nehemiah Massed: [phone]  - Dra. Moye: [phone]  - Dra. Nicole Kindred: [phone]  En caso de inclemencias del [place], por favor llame a Johnsie Kindred principal al [phone] para una actualizacin sobre el [place] de cualquier retraso o cierre.  Consejos para la medicacin en dermatologa: Por favor, guarde las cajas en las que vienen los medicamentos de uso tpico para ayudarle a seguir las instrucciones sobre dnde y cmo usarlos. Las farmacias generalmente imprimen las instrucciones del medicamento slo en las cajas y no directamente en los tubos del [place].   Si su medicamento es muy caro, por favor, pngase en contacto con Zigmund Daniel llamando al [phone]  y presione la opcin 4 o envenos un mensaje a travs de Pharmacist, community.   No podemos decirle cul ser su copago por los medicamentos por adelantado ya que esto es diferente dependiendo de la cobertura de su seguro. Sin embargo, es posible que podamos encontrar un medicamento sustituto a Electrical engineer un formulario para que el seguro cubra el medicamento que se considera necesario.   Si se requiere una autorizacin previa para que su compaa de seguros [place] su medicamento, por favor permtanos de 1 a 2 das hbiles para completar este proceso.  Los precios de los medicamentos varan con frecuencia dependiendo del Environmental consultant de dnde se surte la receta y alguna farmacias pueden ofrecer precios ms baratos.  El sitio web www.goodrx.com tiene cupones para medicamentos de Airline pilot. Los precios aqu no tienen en cuenta lo que podra costar con la ayuda del seguro (puede ser ms barato con su seguro), pero el sitio web puede darle el precio si no utiliz Research scientist (physical sciences).  - Puede imprimir el cupn  correspondiente y llevarlo con su receta a la farmacia.  - Tambin puede pasar por nuestra oficina durante el horario de atencin regular y Charity fundraiser una tarjeta de cupones de GoodRx.  - Si necesita que su receta se enve electrnicamente a una farmacia diferente, informe a nuestra oficina a travs de MyChart de [place] o por telfono llamando al [phone] y presione la opcin 4.

## 2022-09-17 NOTE — Progress Notes (Deleted)
   Follow-Up Visit   Subjective  Cheryl [name] is a 76 y.o. female who presents for the following: Rash.     The following portions of the chart were reviewed this encounter and updated as appropriate:      Review of Systems: No other skin or systemic complaints except as noted in HPI or Assessment and Plan.    Objective  Well appearing patient in no apparent distress; mood and affect are within normal limits.  {[facility]:94709::"G full examination was performed including scalp, head, eyes, ears, nose, lips, neck, chest, axillae, abdomen, back, buttocks, bilateral upper extremities, bilateral lower extremities, hands, feet, fingers, toes, fingernails, and toenails. All findings within normal limits unless otherwise noted below."}   Assessment & Plan   No follow-ups on file. I, Ruthell Rummage, CMA, am acting as scribe for Forest Gleason, MD.

## 2022-09-22 NOTE — Telephone Encounter (Signed)
I would not recommend this in someone with scabies. It is a strong topical steroid that can be used short term for stubborn eczema or psoriasis on the body, but it can worsen infections.   Please also thank her so much for the thoughtful treat for the office last week! Those were some of the best donuts I have had. Thank you!

## 2022-09-23 ENCOUNTER — Encounter: Payer: Self-pay | Admitting: Dermatology

## 2022-09-23 NOTE — Telephone Encounter (Signed)
Patient advised of information per Dr. Laurence Ferrari.  She states she is coming with her fiance tomorrow at 9:20 and you can let her know what you want her to do for the itching. aw

## 2022-09-24 ENCOUNTER — Other Ambulatory Visit: Payer: Self-pay

## 2022-09-24 MED ORDER — IVERMECTIN 3 MG PO TABS: ORAL_TABLET | ORAL | 1 refills | Status: DC

## 2022-09-29 ENCOUNTER — Telehealth: Payer: Self-pay

## 2022-09-29 NOTE — Telephone Encounter (Signed)
Patient called left message on the voicemail she is concerned about her arms.   Returned patients call no answer, no answering machine

## 2022-10-09 ENCOUNTER — Ambulatory Visit: Payer: Medicare Other | Admitting: Dermatology

## 2022-10-15 ENCOUNTER — Ambulatory Visit (INDEPENDENT_AMBULATORY_CARE_PROVIDER_SITE_OTHER): Payer: Medicare Other | Admitting: Dermatology

## 2022-10-15 DIAGNOSIS — R21 Rash and other nonspecific skin eruption: Secondary | ICD-10-CM

## 2022-10-15 NOTE — Progress Notes (Signed)
   Follow-Up Visit   Subjective  Cheryl [name] is a 76 y.o. female who presents for the following: Follow-up (Patient here today for rash follow up. Patient not using anything currently, not itching. Patient does have a few of the spots that won't go away at lower legs. ).  Patients exam at last visit c/w scabies and was prescribed TMC 0.1% cream and permethrin.   The following portions of the chart were reviewed this encounter and updated as appropriate:   Tobacco  Allergies  Meds  Problems  Med Hx  Surg Hx  Fam Hx      Review of Systems:  No other skin or systemic complaints except as noted in HPI or Assessment and Plan.  Objective  Well appearing patient in no apparent distress; mood and affect are within normal limits.  A focused examination was performed including lower legs. Relevant physical exam findings are noted in the Assessment and Plan.  lower legs Few excoriations at lower legs    Assessment & Plan  Rash lower legs  Samples of Hibaclens given to patient to use to wash legs.  Recommend Vaseline or Aquaphor and cover with bandage.     Return if symptoms worsen or fail to improve.  Graciella Belton, RMA, am acting as scribe for Forest Gleason, MD .  Documentation: I have reviewed the above documentation for accuracy and completeness, and I agree with the above.  Forest Gleason, MD

## 2022-10-15 NOTE — Patient Instructions (Addendum)
Samples of Hibaclens given to patient to use to wash legs.  Recommend Vaseline or Aquaphor and cover with bandage.   Due to recent changes in healthcare laws, you may see results of your pathology and/or laboratory studies on MyChart before the doctors have had a chance to review them. We understand that in some cases there may be results that are confusing or concerning to you. Please understand that not all results are received at the same time and often the doctors may need to interpret multiple results in order to provide you with the best plan of care or course of treatment. Therefore, we ask that you please give [place] 2 business days to thoroughly review all your results before contacting the office for clarification. Should we see a critical lab result, you will be contacted sooner.   If You Need Anything After Your Visit  If you have any questions or concerns for your doctor, please call our main line at [phone] and press option 4 to reach your doctor's medical assistant. If no one answers, please leave a voicemail as directed and we will return your call as soon as possible. Messages left after 4 pm will be answered the following business day.   You may also send [place] a message via [place]. We typically respond to MyChart messages within 1-2 business days.  For prescription refills, please ask your pharmacy to contact our office. Our fax number is [phone].  If you have an urgent issue when the clinic is closed that cannot wait until the next business day, you can page your doctor at the number below.    Please note that while we do our best to be available for urgent issues outside of office hours, we are not available 24/7.   If you have an urgent issue and are unable to reach [place], you may choose to seek medical care at your doctor's office, retail clinic, urgent care center, or emergency room.  If you have a medical emergency, please immediately call 911 or go to the emergency  department.  Pager Numbers  - Dr. Nehemiah Massed: [phone]  - Dr. Laurence Ferrari: [phone]  - Dr. Nicole Kindred: [phone]  In the event of inclement weather, please call our main line at [phone] for an update on the status of any delays or closures.  Dermatology Medication Tips: Please keep the boxes that topical medications come in in order to help keep track of the instructions about where and how to use these. Pharmacies typically print the medication instructions only on the boxes and not directly on the medication tubes.   If your medication is too expensive, please contact our office at [phone] option 4 or send [place] a message through [place].   We are unable to tell what your co-pay for medications will be in advance as this is different depending on your insurance coverage. However, we may be able to find a substitute medication at lower cost or fill out paperwork to get insurance to cover a needed medication.   If a prior authorization is required to get your medication covered by your insurance company, please allow [place] 1-2 business days to complete this process.  Drug prices often vary depending on where the prescription is filled and some pharmacies may offer cheaper prices.  The website www.goodrx.com contains coupons for medications through different pharmacies. The prices here do not account for what the cost may be with help from insurance (it may be cheaper with your insurance), but the website can give  give you the price if you did not use any insurance.  - You can print the associated coupon and take it with your prescription to the pharmacy.  - You may also stop by our office during regular business hours and pick up a GoodRx coupon card.  - If you need your prescription sent electronically to a different pharmacy, notify our office through [place] MyChart or by phone at [phone] option 4.     Si Usted Necesita Algo Despus de Su Visita  Tambin puede  enviarnos un mensaje a travs de MyChart. Por lo general respondemos a los mensajes de MyChart en el transcurso de 1 a 2 das hbiles.  Para renovar recetas, por favor pida a su farmacia que se ponga en contacto con nuestra oficina. Nuestro nmero de fax es el [phone].  Si tiene un asunto urgente cuando la clnica est cerrada y que no puede esperar hasta el siguiente da hbil, puede llamar/localizar a su doctor(a) al nmero que aparece a continuacin.   Por favor, tenga en cuenta que aunque hacemos todo lo posible para estar disponibles para asuntos urgentes fuera del horario de oficina, no estamos disponibles las 24 horas del da, los 7 das de la semana.   Si tiene un problema urgente y no puede comunicarse con nosotros, puede optar por buscar atencin mdica  en el consultorio de su doctor(a), en una clnica privada, en un centro de atencin urgente o en una sala de emergencias.  Si tiene una emergencia mdica, por favor llame inmediatamente al 911 o vaya a la sala de emergencias.  Nmeros de bper  - Dr. Kowalski: [phone]  - Dra. Moye: [phone]  - Dra. Stewart: [phone]  En caso de inclemencias del tiempo, por favor llame a nuestra lnea principal al [phone] para una actualizacin sobre el estado de cualquier retraso o cierre.  Consejos para la medicacin en dermatologa: Por favor, guarde las cajas en las que vienen los medicamentos de uso tpico para ayudarle a seguir las instrucciones sobre dnde y cmo usarlos. Las farmacias generalmente imprimen las instrucciones del medicamento slo en las cajas y no directamente en los tubos del medicamento.   Si su medicamento es muy caro, por favor, pngase en contacto con nuestra oficina llamando al [phone] y presione la opcin 4 o envenos un mensaje a travs de MyChart.   No podemos decirle cul ser su copago por los medicamentos por adelantado ya que esto es diferente dependiendo de la cobertura de su seguro.  Sin embargo, es posible que podamos encontrar un medicamento sustituto a menor costo o llenar un formulario para que el seguro cubra el medicamento que se considera necesario.   Si se requiere una autorizacin previa para que su compaa de seguros cubra su medicamento, por favor permtanos de 1 a 2 das hbiles para completar este proceso.  Los precios de los medicamentos varan con frecuencia dependiendo del lugar de dnde se surte la receta y alguna farmacias pueden ofrecer precios ms baratos.  El sitio web www.goodrx.com tiene cupones para medicamentos de diferentes farmacias. Los precios aqu no tienen en cuenta lo que podra costar con la ayuda del seguro (puede ser ms barato con su seguro), pero el sitio web puede darle el precio si no utiliz ningn seguro.  - Puede imprimir el cupn correspondiente y llevarlo con su receta a la farmacia.  - Tambin puede pasar por nuestra oficina durante el horario de atencin regular y recoger una tarjeta de cupones de GoodRx.  -   Si necesita que su receta se enve electrnicamente a una farmacia diferente, informe a nuestra oficina a travs de MyChart de [place] o por telfono llamando al [phone] y presione la opcin 4.  

## 2022-10-22 ENCOUNTER — Encounter: Payer: Self-pay | Admitting: Dermatology

## 2023-04-03 ENCOUNTER — Other Ambulatory Visit: Payer: Self-pay | Admitting: Family Medicine

## 2023-04-03 DIAGNOSIS — Z780 Asymptomatic menopausal state: Secondary | ICD-10-CM

## 2023-05-21 ENCOUNTER — Telehealth: Payer: Self-pay

## 2023-05-21 ENCOUNTER — Ambulatory Visit (INDEPENDENT_AMBULATORY_CARE_PROVIDER_SITE_OTHER): Payer: Medicare HMO | Admitting: Dermatology

## 2023-05-21 ENCOUNTER — Other Ambulatory Visit: Payer: Self-pay

## 2023-05-21 DIAGNOSIS — L299 Pruritus, unspecified: Secondary | ICD-10-CM

## 2023-05-21 DIAGNOSIS — R21 Rash and other nonspecific skin eruption: Secondary | ICD-10-CM | POA: Diagnosis not present

## 2023-05-21 MED ORDER — CETIRIZINE HCL 10 MG PO TABS: 10.0000 mg | ORAL_TABLET | Freq: Two times a day (BID) | ORAL | 0 refills | Status: DC

## 2023-05-21 MED ORDER — HYDROCORTISONE 2.5 % EX OINT: TOPICAL_OINTMENT | Freq: Two times a day (BID) | CUTANEOUS | 1 refills | Status: DC

## 2023-05-21 MED ORDER — PREDNISONE 10 MG PO TABS: ORAL_TABLET | ORAL | 0 refills | Status: AC

## 2023-05-21 NOTE — Progress Notes (Signed)
   Follow-Up Visit   Subjective  Cheryl [name] is a 77 y.o. female who presents for the following: went to [facility] a few weeks ago and she was told she had bed bugs and was put on prednisone. Started to get better then worsened. Spots are severely pruritic. She has been using Vicks, antifungal powder, all over the counter. Patient's husband does not have similar bites, patient only has bites on the front side.   Patient and her husband have been treated for scabies in the past, the first dermatologist she saw thought she could have lichen planus. This rash feels different to the scabies episode. No evidence of bed bugs at home.  Patient accompanied by husband and daughter, Cheryl [name], who contribute to history. No side effects on prednisone.   Patient has had 2 consecutives UTI's recently.  The following portions of the chart were reviewed this encounter and updated as appropriate: medications, allergies, medical history  Tolerated recent prednisone taper with no side effects. Has HTN controlled with losartan-hydrochlorothiazide. No hx of diabetes or lower extremity swelling.  Review of Systems:  No other skin or systemic complaints except as noted in HPI or Assessment and Plan.  Objective  Well appearing patient in no apparent distress; mood and affect are within normal limits.   A focused examination was performed of the following areas: Arms, legs, back  Relevant exam findings are noted in the Assessment and Plan.  legs, feet, arms Scattered erythematous papules, some with crusted angulated ulcerations, on bilateral anterior lower extremities > right upper extremity and dorsal feet. No lesions on trunk               Assessment & Plan     Rash legs, feet, arms  Patient reports severe pruritus. Lesions on exam have secondary changes and do not inform on the primary process. No evidence of recurrent scabies. Checking labs to rule out pruritus secondary to blood  count, renal, or hepatic dysfunction. Checking HBA1c to ensure patient does not have diabetes (would change prednisone plan). Patient's daughter requested checking for autoimmune diseases, so sending ANA.  Start HC 2.5% ointment twice daily as needed for itching/rash. Start Zyrtec 10 mg twice daily.  Offered a prednisone taper after a week of HC/Zyrtec use if not improving. Patient prefers to start prednisone today. Dosing weight 61 kg. Recently tolerated 40 mg daily as max dose.  Repeat prednisone taper: 40 mg daily x 5 days then down by 10 mg every 5 days  No swelling, no diabetes. Patient is on blood pressure medication.  Risks of prednisone taper discussed including mood irritability, insomnia, weight gain, stomach ulcers, increased risk of infection, increased blood sugar (diabetes), hypertension, osteoporosis with long-term or frequent use, and rare risk of avascular necrosis of the hip.    Related Procedures CBC with Differential/Platelets CMP ANA Hemoglobin A1c  Pruritus  Related Procedures Hemoglobin A1c    Return in about 2 weeks (around 06/04/2023) for Rash.  Anise Salvo, RMA, am acting as scribe for Elie Goody, MD .   Documentation: I have reviewed the above documentation for accuracy and completeness, and I agree with the above.  Elie Goody, MD

## 2023-05-21 NOTE — Telephone Encounter (Signed)
Patient called the office wanting the large jar she was "promised" by Dr. Katrinka Blazing. Dr. Katrinka Blazing did send in the large jar of Hydrocortisone Ointment but insurance will only cover 240 grams every 23 days.  I Called the pharmacy and they only had a small tube in stock so they did give that to her to get started and owe patient 7 more tubes which will be delivered tomorrow.  Left message for patient to return my call. aw

## 2023-05-21 NOTE — Patient Instructions (Addendum)
Prednisone taper - Take 4 tablets (40 mg total) by mouth daily for 5 days, THEN 3 tablets (30 mg total) daily for 5 days, THEN 2 tablets (20 mg total) daily for 5 days, THEN 1 tablet (10 mg total) daily for 5 days   Start hydrocortisone 2.5% ointment twice daily as needed for itching/rash. Start Zyrtec 10 mg twice daily.  Due to recent changes in healthcare laws, you may see results of your pathology and/or laboratory studies on MyChart before the doctors have had a chance to review them. We understand that in some cases there may be results that are confusing or concerning to you. Please understand that not all results are received at the same time and often the doctors may need to interpret multiple results in order to provide you with the best plan of care or course of treatment. Therefore, we ask that you please give [place] 2 business days to thoroughly review all your results before contacting the office for clarification. Should we see a critical lab result, you will be contacted sooner.   If You Need Anything After Your Visit  If you have any questions or concerns for your doctor, please call our main line at [phone] and press option 4 to reach your doctor's medical assistant. If no one answers, please leave a voicemail as directed and we will return your call as soon as possible. Messages left after 4 pm will be answered the following business day.   You may also send [place] a message via MyChart. We typically respond to MyChart messages within 1-2 business days.  For prescription refills, please ask your pharmacy to contact our office. Our fax number is [phone].  If you have an urgent issue when the clinic is closed that cannot wait until the next business day, you can page your doctor at the number below.    Please note that while we do our best to be available for urgent issues outside of office hours, we are not available 24/7.   If you have an urgent issue and are unable to reach  [place], you may choose to seek medical care at your doctor's office, retail clinic, urgent care center, or emergency room.  If you have a medical emergency, please immediately call 911 or go to the emergency department.  Pager Numbers  - Dr. Gwen Pounds: [phone]  - Dr. Roseanne Reno: [phone]  In the event of inclement weather, please call our main line at [phone] for an update on the status of any delays or closures.  Dermatology Medication Tips: Please keep the boxes that topical medications come in in order to help keep track of the instructions about where and how to use these. Pharmacies typically print the medication instructions only on the boxes and not directly on the medication tubes.   If your medication is too expensive, please contact our office at [phone] option 4 or send [place] a message through MyChart.   We are unable to tell what your co-pay for medications will be in advance as this is different depending on your insurance coverage. However, we may be able to find a substitute medication at lower cost or fill out paperwork to get insurance to cover a needed medication.   If a prior authorization is required to get your medication covered by your insurance company, please allow [place] 1-2 business days to complete this process.  Drug prices often vary depending on where the prescription is filled and some pharmacies may offer cheaper prices.  The  website www.goodrx.com contains coupons for medications through different pharmacies. The prices here do not account for what the cost may be with help from insurance (it may be cheaper with your insurance), but the website can give you the price if you did not use any insurance.  - You can print the associated coupon and take it with your prescription to the pharmacy.  - You may also stop by our office during regular business hours and pick up a GoodRx coupon card.  - If you need your prescription sent electronically to a different  pharmacy, notify our office through [facility] or by phone at [phone] option 4.

## 2023-05-22 LAB — CBC WITH DIFFERENTIAL/PLATELET: Immature Granulocytes: 1 %

## 2023-05-26 ENCOUNTER — Other Ambulatory Visit: Payer: Medicare HMO

## 2023-05-28 ENCOUNTER — Ambulatory Visit: Payer: Medicare Other | Admitting: Dermatology

## 2023-06-10 ENCOUNTER — Ambulatory Visit: Payer: Medicare HMO | Admitting: Dermatology

## 2023-06-14 ENCOUNTER — Other Ambulatory Visit: Payer: Self-pay | Admitting: Dermatology

## 2023-06-15 ENCOUNTER — Emergency Department
Admission: EM | Admit: 2023-06-15 | Discharge: 2023-06-15 | Disposition: A | Discharge status: Home / Self Care | Payer: Medicare HMO | Attending: Emergency Medicine | Admitting: Emergency Medicine

## 2023-06-15 ENCOUNTER — Other Ambulatory Visit: Payer: Self-pay

## 2023-06-15 DIAGNOSIS — R21 Rash and other nonspecific skin eruption: Secondary | ICD-10-CM | POA: Diagnosis not present

## 2023-06-15 MED ORDER — MUPIROCIN 2 % EX OINT: 1.0000 | TOPICAL_OINTMENT | Freq: Two times a day (BID) | CUTANEOUS | 0 refills | Status: AC

## 2023-06-15 MED ORDER — CEPHALEXIN 500 MG PO CAPS: 500.0000 mg | ORAL_CAPSULE | Freq: Four times a day (QID) | ORAL | 0 refills | Status: DC

## 2023-06-15 MED ORDER — MUPIROCIN 2 % EX OINT: 1.0000 | TOPICAL_OINTMENT | Freq: Two times a day (BID) | CUTANEOUS | 0 refills | Status: DC

## 2023-06-15 MED ORDER — CEPHALEXIN 500 MG PO CAPS: 500.0000 mg | ORAL_CAPSULE | Freq: Four times a day (QID) | ORAL | 0 refills | Status: AC

## 2023-06-15 NOTE — ED Provider Notes (Signed)
[facility] Provider Note  Patient Contact: 4:22 PM (approximate)   History   Insect Bite   HPI  Cheryl [name] is a 77 y.o. female with a history of nonspecific rash dating back to at least 2018, presents to the emergency department with erythema surrounding rash.  Patient reports that her symptoms have become progressively worse over the past 3 days and patient has been scratching at the affected areas vigorously.  She denies fever and chills.      Physical Exam   Triage Vital Signs: ED Triage Vitals  Encounter Vitals Group     BP 06/15/23 1436 (!) 166/110     Systolic BP Percentile --      Diastolic BP Percentile --      Pulse Rate 06/15/23 1436 99     Resp 06/15/23 1436 19     Temp 06/15/23 1436 98.3 F (36.8 C)     Temp Source 06/15/23 1436 Oral     SpO2 06/15/23 1436 97 %     Weight 06/15/23 1435 134 lb (60.8 kg)     Height 06/15/23 1435 5' (1.524 m)     Head Circumference --      Peak Flow --      Pain Score 06/15/23 1436 0     Pain Loc --      Pain Education --      Exclude from Growth Chart --     Most recent vital signs: Vitals:   06/15/23 1436  BP: (!) 166/110  Pulse: 99  Resp: 19  Temp: 98.3 F (36.8 C)  SpO2: 97%     General: Alert and in no acute distress. Eyes:  PERRL. EOMI. Head: No acute traumatic findings ENT:      Nose: No congestion/rhinnorhea.      Mouth/Throat: Mucous membranes are moist. Neck: No stridor. No cervical spine tenderness to palpation. Cardiovascular:  Good peripheral perfusion Respiratory: Normal respiratory effort without tachypnea or retractions. Lungs CTAB. Good air entry to the bases with no decreased or absent breath sounds. Gastrointestinal: Bowel sounds 4 quadrants. Soft and nontender to palpation. No guarding or rigidity. No palpable masses. No distention. No CVA tenderness. Musculoskeletal: Full range of motion to all extremities.  Neurologic:  No gross focal neurologic deficits  are appreciated.  Skin: Patient has erythema of the upper extremities secondary to excoriation around rash.  Rash appears as shallow ulcerations with overlying slough. Other:   ED Results / Procedures / Treatments   Labs (all labs ordered are listed, but only abnormal results are displayed) Labs Reviewed - No data to display      PROCEDURES:  Critical Care performed: No  Procedures   MEDICATIONS ORDERED IN ED: Medications - No data to display   IMPRESSION / MDM / ASSESSMENT AND PLAN / ED COURSE  I reviewed the triage vital signs and the nursing notes.                              Assessment and plan Rash Secondary cellulitis 77 year old female presents to the emergency department with erythema surrounding rash.  Patient was hypertensive at triage but vital signs were otherwise reassuring.  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  Will start patient on Keflex and topical mupirocin will have her follow-up with dermatology.     FINAL CLINICAL IMPRESSION(S) /  ED DIAGNOSES   Final diagnoses:  Rash     Rx / DC Orders   ED Discharge Orders          Ordered    cephALEXin (KEFLEX) 500 MG capsule  4 times daily        06/15/23 1620    mupirocin ointment (BACTROBAN) 2 %  2 times daily        06/15/23 1620             Note:  This document was prepared using Dragon voice recognition software and may include unintentional dictation errors.   Pia Mau [place], PA-C 06/15/23 1625    Corena Herter, MD 06/16/23 0005

## 2023-06-15 NOTE — Discharge Instructions (Addendum)
Take Keflex four times daily for the next seven days. Apply Mupirocin twice daily.

## 2023-06-15 NOTE — ED Notes (Signed)
Pt A&O x4, no obvious distress noted, respirations regular/unlabored. Pt verbalizes understanding of discharge instructions. Pt able to ambulate from ED independently.   

## 2023-06-15 NOTE — ED Triage Notes (Signed)
Pt c/o multiple insect bites all over her body. She said she has been to multiple doctors prior to today for same. She received prednisone. Pt seen by dermatologist and was given something for itching. Pt says she has had her home treated but she is seeing new places appear. Pt c/o severe itching

## 2023-06-20 ENCOUNTER — Emergency Department
Admission: EM | Admit: 2023-06-20 | Discharge: 2023-06-20 | Disposition: A | Discharge status: Home / Self Care | Payer: Medicare HMO | Source: Home / Self Care | Attending: Emergency Medicine | Admitting: Emergency Medicine

## 2023-06-20 ENCOUNTER — Other Ambulatory Visit: Payer: Self-pay

## 2023-06-20 DIAGNOSIS — I10 Essential (primary) hypertension: Secondary | ICD-10-CM | POA: Insufficient documentation

## 2023-06-20 DIAGNOSIS — R21 Rash and other nonspecific skin eruption: Secondary | ICD-10-CM

## 2023-06-20 DIAGNOSIS — L299 Pruritus, unspecified: Secondary | ICD-10-CM

## 2023-06-20 LAB — COMPREHENSIVE METABOLIC PANEL
ALT: 14 U/L (ref 0–44)
AST: 16 U/L (ref 15–41)
Albumin: 3 g/dL — ABNORMAL LOW (ref 3.5–5.0)
Alkaline Phosphatase: 51 U/L (ref 38–126)
Anion gap: 6 (ref 5–15)
BUN: 12 mg/dL (ref 8–23)
CO2: 24 mmol/L (ref 22–32)
Calcium: 8.5 mg/dL — ABNORMAL LOW (ref 8.9–10.3)
Chloride: 106 mmol/L (ref 98–111)
Creatinine, Ser: 0.73 mg/dL (ref 0.44–1.00)
GFR, Estimated: >60 mL/min (ref >60)
Glucose, Bld: 74 mg/dL (ref 70–99)
Potassium: 4 mmol/L (ref 3.5–5.1)
Sodium: 136 mmol/L (ref 135–145)
Total Bilirubin: 0.4 mg/dL (ref 0.3–1.2)
Total Protein: 5.7 g/dL — ABNORMAL LOW (ref 6.5–8.1)

## 2023-06-20 LAB — CBC WITH DIFFERENTIAL/PLATELET
Abs Immature Granulocytes: 0.01 10*3/uL (ref 0.00–0.07)
Basophils Absolute: 0 10*3/uL (ref 0.0–0.1)
Basophils Relative: 1 %
Eosinophils Absolute: 0.1 10*3/uL (ref 0.0–0.5)
Eosinophils Relative: 1 %
HCT: 34.6 % — ABNORMAL LOW (ref 36.0–46.0)
Hemoglobin: 11.6 g/dL — ABNORMAL LOW (ref 12.0–15.0)
Immature Granulocytes: 0 %
Lymphocytes Relative: 31 %
Lymphs Abs: 1.4 10*3/uL (ref 0.7–4.0)
MCH: 30.5 pg (ref 26.0–34.0)
MCHC: 33.5 g/dL (ref 30.0–36.0)
MCV: 91.1 fL (ref 80.0–100.0)
Monocytes Absolute: 0.4 10*3/uL (ref 0.1–1.0)
Monocytes Relative: 8 %
Neutro Abs: 2.8 10*3/uL (ref 1.7–7.7)
Neutrophils Relative %: 59 %
Platelets: 150 10*3/uL (ref 150–400)
RBC: 3.8 MIL/uL — ABNORMAL LOW (ref 3.87–5.11)
RDW: 14.1 % (ref 11.5–15.5)
WBC: 4.7 10*3/uL (ref 4.0–10.5)
nRBC: 0 % (ref 0.0–0.2)

## 2023-06-20 LAB — LACTIC ACID, PLASMA: Lactic Acid, Venous: 1.3 mmol/L (ref 0.5–1.9)

## 2023-06-20 MED ORDER — METHYLPREDNISOLONE SODIUM SUCC 125 MG IJ SOLR
80.0000 mg | Freq: Once | INTRAMUSCULAR | Status: AC
  Administered 2023-06-20: 80 mg via INTRAVENOUS
  Filled 2023-06-20: qty 2

## 2023-06-20 MED ORDER — HYDROCORTISONE 0.5 % EX CREA: 1.0000 | TOPICAL_CREAM | Freq: Two times a day (BID) | CUTANEOUS | 1 refills | Status: DC

## 2023-06-20 NOTE — ED Triage Notes (Signed)
Pt to ED for itching and rash to both legs upper and lower. Pt seen here on 8/19 for same. Currently on antibiotics. States rash has not gotten better.

## 2023-06-20 NOTE — ED Provider Notes (Signed)
[facility] Provider Note    Event Date/Time   First MD Initiated Contact with Patient 06/20/23 [phone]     (approximate)   History   Rash (2wks)   HPI  Cheryl [name] is a 77 y.o. female   presents to the ED with complaint of rash for approximately 2 weeks.  Patient was seen in the emergency department, states she has been seen by dermatology and [facility] without any relief of her rash.  Patient has a history of hypertension, depression, gastric ulcer, degenerative disc disease lumbar, spinal stenosis, allergies and ulcerative colitis.      Physical Exam   Triage Vital Signs: ED Triage Vitals  Encounter Vitals Group     BP 06/20/23 0813 (!) 153/97     Systolic BP Percentile --      Diastolic BP Percentile --      Pulse Rate 06/20/23 0813 74     Resp 06/20/23 0813 16     Temp 06/20/23 0813 98.1 F (36.7 C)     Temp Source 06/20/23 0813 Oral     SpO2 06/20/23 0813 96 %     Weight 06/20/23 0812 134 lb 7.7 oz (61 kg)     Height 06/20/23 0812 5' (1.524 m)     Head Circumference --      Peak Flow --      Pain Score 06/20/23 0810 0     Pain Loc --      Pain Education --      Exclude from Growth Chart --     Most recent vital signs: Vitals:   06/20/23 0813  BP: (!) 153/97  Pulse: 74  Resp: 16  Temp: 98.1 F (36.7 C)  SpO2: 96%     General: Awake, no distress.  Able to talk in complete sentences without any shortness of breath, no wheezing. CV:  Good peripheral perfusion.  Resp:  Normal effort.  Abd:  No distention.  Other:  On examination of the right forearm and lower extremities there is individualized erythematous lesions.  No warmth or drainage.  Nontender to palpation.  Multiple lesions have shallow excoriations without drainage which appear to be old.  Lower extremities are also involved with more involvement to the medial aspect than the lateral.  No edema or warmth present.   ED Results / Procedures / Treatments    Labs (all labs ordered are listed, but only abnormal results are displayed) Labs Reviewed  CBC WITH DIFFERENTIAL/PLATELET - Abnormal; Notable for the following components:      Result Value   RBC 3.80 (*)    Hemoglobin 11.6 (*)    HCT 34.6 (*)    All other components within normal limits  COMPREHENSIVE METABOLIC PANEL - Abnormal; Notable for the following components:   Calcium 8.5 (*)    Total Protein 5.7 (*)    Albumin 3.0 (*)    All other components within normal limits  LACTIC ACID, PLASMA      PROCEDURES:  Critical Care performed:   Procedures   MEDICATIONS ORDERED IN ED: Medications  methylPREDNISolone sodium succinate (SOLU-MEDROL) 125 mg/2 mL injection 80 mg (80 mg Intravenous Given 06/20/23 1016)     IMPRESSION / MDM / ASSESSMENT AND PLAN / ED COURSE  I reviewed the triage vital signs and the nursing notes.   Differential diagnosis includes, but is not limited to, contact dermatitis, cellulitis, chronic recurrent rash, lichen planus worsened by excoriations.  77 year old female presents to the ED  with complaint of continued rash.  In looking through her record this is not a new rash and she has been seen at [facility] and [facility] dermatology for similar rash noted from office notes from January/2024, April, June.  Also noted office visits for 2023.  I explained to the patient that she would also need to follow-up with [place] dermatology if she wished to have a biopsy of these areas made.  She originally wanted to be hospitalized but was reassured that she was not septic and that her lab work was reassuring.  Patient was given Solu-Medrol 80 mg IV while in the emergency department and a prescription for hydrocortisone cream 0.5% was sent to the pharmacy for her to begin using twice a day to these areas.  Patient states that she has hydroxyzine at home if needed for itching.      Patient's presentation is most consistent with acute complicated illness /  injury requiring diagnostic workup.  FINAL CLINICAL IMPRESSION(S) / ED DIAGNOSES   Final diagnoses:  Rash and nonspecific skin eruption  Pruritus     Rx / DC Orders   ED Discharge Orders          Ordered    hydrocortisone cream 0.5 %  2 times daily        06/20/23 1015             Note:  This document was prepared using Dragon voice recognition software and may include unintentional dictation errors.   Tommi Rumps, PA-C 06/20/23 1417    Jene Every, MD 06/20/23 [phone]

## 2023-06-23 ENCOUNTER — Other Ambulatory Visit: Payer: Self-pay

## 2023-06-23 ENCOUNTER — Emergency Department
Admission: EM | Admit: 2023-06-23 | Discharge: 2023-06-23 | Disposition: A | Discharge status: Home / Self Care | Payer: Medicare HMO | Attending: Student in an Organized Health Care Education/Training Program | Admitting: Student in an Organized Health Care Education/Training Program

## 2023-06-23 DIAGNOSIS — R21 Rash and other nonspecific skin eruption: Secondary | ICD-10-CM | POA: Diagnosis present

## 2023-06-23 MED ORDER — HYDROCORTISONE 0.5 % EX CREA: 1.0000 | TOPICAL_CREAM | Freq: Two times a day (BID) | CUTANEOUS | 1 refills | Status: DC

## 2023-06-23 MED ORDER — PREDNISONE 10 MG PO TABS: 10.0000 mg | ORAL_TABLET | Freq: Every day | ORAL | 0 refills | Status: DC

## 2023-06-23 MED ORDER — METHYLPREDNISOLONE SODIUM SUCC 125 MG IJ SOLR
60.0000 mg | Freq: Once | INTRAMUSCULAR | Status: AC
  Administered 2023-06-23: 60 mg via INTRAMUSCULAR
  Filled 2023-06-23: qty 2

## 2023-06-23 NOTE — ED Triage Notes (Signed)
Pt to ED for continued rash to right arm and bilateral legs for a couple weeks. Has been seen in ED x2 for same.

## 2023-06-23 NOTE — Discharge Instructions (Signed)
Please follow-up with dermatology.

## 2023-06-23 NOTE — ED Provider Notes (Signed)
[facility] Provider Note    Event Date/Time   First MD Initiated Contact with Patient 06/23/23 [phone]     (approximate)   History   Rash   HPI  Cheryl [name] is a 77 y.o. female history of lichen planus and dermatitis presents to the ER for second time in a week due to persistent itching and discomfort of her bilateral lower extremities and right flexor surface of her arm.  Did feel some improvement after steroids.  Has not been able to fill the steroid cream that was sent to the pharmacy as they are currently out.  She denies any fevers or chills.  Did have blood work done at dermatology clinic has not followed back up with them.     Physical Exam   Triage Vital Signs: ED Triage Vitals  Encounter Vitals Group     BP 06/23/23 0937 (!) 155/84     Systolic BP Percentile --      Diastolic BP Percentile --      Pulse Rate 06/23/23 0937 77     Resp 06/23/23 0937 18     Temp 06/23/23 0937 97.9 F (36.6 C)     Temp src --      SpO2 06/23/23 0937 95 %     Weight 06/23/23 0936 134 lb 7.7 oz (61 kg)     Height 06/23/23 0936 5' (1.524 m)     Head Circumference --      Peak Flow --      Pain Score 06/23/23 0936 8     Pain Loc --      Pain Education --      Exclude from Growth Chart --     Most recent vital signs: Vitals:   06/23/23 0937  BP: (!) 155/84  Pulse: 77  Resp: 18  Temp: 97.9 F (36.6 C)  SpO2: 95%     Constitutional: Alert  Eyes: Conjunctivae are normal.  Head: Atraumatic. Nose: No congestion/rhinnorhea. Mouth/Throat: Mucous membranes are moist.   Neck: Painless ROM.  Cardiovascular:   Good peripheral circulation. Respiratory: Normal respiratory effort.  No retractions.  Gastrointestinal: Soft and nontender.  Musculoskeletal:  no deformity Neurologic:  MAE spontaneously. No gross focal neurologic deficits are appreciated.  Skin: Scattered areas of ulceration and erythematous eruption of bilateral lower extremities as well as  the flexor the right arm.  No urticaria.  No bleeding.  No cellulitic changes. Psychiatric: Mood and affect are normal. Speech and behavior are normal.    ED Results / Procedures / Treatments   Labs (all labs ordered are listed, but only abnormal results are displayed) Labs Reviewed - No data to display   EKG     RADIOLOGY    PROCEDURES:  Critical Care performed:   Procedures   MEDICATIONS ORDERED IN ED: Medications  methylPREDNISolone sodium succinate (SOLU-MEDROL) 125 mg/2 mL injection 60 mg (has no administration in time range)     IMPRESSION / MDM / ASSESSMENT AND PLAN / ED COURSE  I reviewed the triage vital signs and the nursing notes.                              Differential diagnosis includes, but is not limited to, drug eruption, lichen planus, ulcerations, dermatitis  Patient presented to the ER for evaluation of symptoms as described above she is nontoxic.  Does have a uncomfortable appearing rash but has been present for quite  some time.  Not consistent with SJS or TE N.  Did improve with steroids previously will redose steroids.  Does not appear consistent with cellulitis.  Does appear appropriate for outpatient follow-up.  We discussed the importance of follow-up with dermatology.       FINAL CLINICAL IMPRESSION(S) / ED DIAGNOSES   Final diagnoses:  Rash     Rx / DC Orders   ED Discharge Orders          Ordered    hydrocortisone cream 0.5 %  2 times daily        06/23/23 0953    predniSONE (DELTASONE) 10 MG tablet  Daily        06/23/23 0953             Note:  This document was prepared using Dragon voice recognition software and may include unintentional dictation errors.    Willy Eddy, MD 06/23/23 [phone]

## 2023-06-25 ENCOUNTER — Other Ambulatory Visit: Payer: Self-pay

## 2023-06-25 DIAGNOSIS — X58XXXA Exposure to other specified factors, initial encounter: Secondary | ICD-10-CM | POA: Insufficient documentation

## 2023-06-25 DIAGNOSIS — Z5321 Procedure and treatment not carried out due to patient leaving prior to being seen by health care provider: Secondary | ICD-10-CM | POA: Diagnosis not present

## 2023-06-25 DIAGNOSIS — S81802A Unspecified open wound, left lower leg, initial encounter: Secondary | ICD-10-CM | POA: Insufficient documentation

## 2023-06-25 NOTE — ED Triage Notes (Signed)
Pt has numerous bleeding wounds to L leg. Reports seen multiple times for the same and "no one can figure out what it is." Reports suspects initial onset from spider bite. Wounds are open and slow seep of blood but overall controlled. No pus or other drainage noted. Some areas have appearance of being scratched and scabs removed by doing so. Pt alert and oriented. Breathing unlabored speaking in full sentences.

## 2023-06-26 ENCOUNTER — Emergency Department
Admission: EM | Admit: 2023-06-26 | Discharge: 2023-06-26 | Discharge status: Against Medical Advice | Payer: Medicare HMO | Attending: Emergency Medicine | Admitting: Emergency Medicine

## 2023-07-02 ENCOUNTER — Other Ambulatory Visit: Payer: Self-pay

## 2023-07-02 ENCOUNTER — Emergency Department: Payer: Medicare HMO

## 2023-07-02 ENCOUNTER — Emergency Department
Admission: EM | Admit: 2023-07-02 | Discharge: 2023-07-02 | Disposition: A | Discharge status: Home / Self Care | Payer: Medicare HMO | Attending: Emergency Medicine | Admitting: Emergency Medicine

## 2023-07-02 DIAGNOSIS — I7143 Infrarenal abdominal aortic aneurysm, without rupture: Secondary | ICD-10-CM | POA: Diagnosis not present

## 2023-07-02 DIAGNOSIS — K648 Other hemorrhoids: Secondary | ICD-10-CM

## 2023-07-02 DIAGNOSIS — K5900 Constipation, unspecified: Secondary | ICD-10-CM | POA: Diagnosis not present

## 2023-07-02 DIAGNOSIS — K625 Hemorrhage of anus and rectum: Secondary | ICD-10-CM | POA: Insufficient documentation

## 2023-07-02 DIAGNOSIS — E876 Hypokalemia: Secondary | ICD-10-CM | POA: Insufficient documentation

## 2023-07-02 DIAGNOSIS — D72829 Elevated white blood cell count, unspecified: Secondary | ICD-10-CM | POA: Insufficient documentation

## 2023-07-02 LAB — CBC WITH DIFFERENTIAL/PLATELET
Abs Immature Granulocytes: 0.23 10*3/uL — ABNORMAL HIGH (ref 0.00–0.07)
Basophils Absolute: 0 10*3/uL (ref 0.0–0.1)
Basophils Relative: 0 %
Eosinophils Absolute: 0.1 10*3/uL (ref 0.0–0.5)
Eosinophils Relative: 0 %
HCT: 36.6 % (ref 36.0–46.0)
Hemoglobin: 13.5 g/dL (ref 12.0–15.0)
Immature Granulocytes: 2 %
Lymphocytes Relative: 29 %
Lymphs Abs: 4.2 10*3/uL — ABNORMAL HIGH (ref 0.7–4.0)
MCH: 31.9 pg (ref 26.0–34.0)
MCHC: 36.9 g/dL — ABNORMAL HIGH (ref 30.0–36.0)
MCV: 86.5 fL (ref 80.0–100.0)
Monocytes Absolute: 1.4 10*3/uL — ABNORMAL HIGH (ref 0.1–1.0)
Monocytes Relative: 10 %
Neutro Abs: 8.8 10*3/uL — ABNORMAL HIGH (ref 1.7–7.7)
Neutrophils Relative %: 59 %
Platelets: 203 10*3/uL (ref 150–400)
RBC: 4.23 MIL/uL (ref 3.87–5.11)
RDW: 14.6 % (ref 11.5–15.5)
WBC: 14.8 10*3/uL — ABNORMAL HIGH (ref 4.0–10.5)
nRBC: 0 % (ref 0.0–0.2)

## 2023-07-02 LAB — BASIC METABOLIC PANEL
Anion gap: 10 (ref 5–15)
BUN: 15 mg/dL (ref 8–23)
CO2: 23 mmol/L (ref 22–32)
Calcium: 8.4 mg/dL — ABNORMAL LOW (ref 8.9–10.3)
Chloride: 100 mmol/L (ref 98–111)
Creatinine, Ser: 0.86 mg/dL (ref 0.44–1.00)
GFR, Estimated: >60 mL/min (ref >60)
Glucose, Bld: 91 mg/dL (ref 70–99)
Potassium: 3.2 mmol/L — ABNORMAL LOW (ref 3.5–5.1)
Sodium: 133 mmol/L — ABNORMAL LOW (ref 135–145)

## 2023-07-02 MED ORDER — IOHEXOL 350 MG/ML SOLN
100.0000 mL | Freq: Once | INTRAVENOUS | Status: AC | PRN
  Administered 2023-07-02: 100 mL via INTRAVENOUS

## 2023-07-02 MED ORDER — SENNA 8.6 MG PO TABS: 1.0000 | ORAL_TABLET | Freq: Every day | ORAL | 0 refills | Status: AC

## 2023-07-02 NOTE — Discharge Instructions (Addendum)
You are seen in the emergency department today for your rectal bleeding.  No evidence of significant GI bleed.  Your workup shows moderate amounts of stool throughout your entire colon indicating constipation.  I suspect, with straining you have developed a small internal hemorrhoid which is the source of your bleeding.  I am going to start you on a bowel regimen and to help have more frequent stools at home.  Please take these medications as prescribed.  You can pick up MiraLAX over-the-counter and take 2 capfuls every morning until you start having daily bowel movements, and then you can decrease this amount.  Follow-up with your primary care provider within a week to discuss hemorrhoid management and further evaluation as needed.  I have also sent referral to general surgery for them to monitor any rectal pain complaints as well as your hemorrhoid.  They can identify if there are any changes that would be concerning that this is more than just a hemorrhoid and needs further evaluation.  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  I have sent you a referral to follow-up with vascular surgery for ongoing monitoring outpatient.

## 2023-07-02 NOTE — ED Triage Notes (Signed)
Pt to ED via [facility] from home for complaints of dark, tarry stools that began last night. Pt not on thinners. Pt has multiple sores on arms and legs and has been seen at multiple facilities for the same and states "they can't figure out what it is."  BP 172/93 HR 90

## 2023-07-02 NOTE — ED Provider Notes (Signed)
[facility] Provider Note    Event Date/Time   First MD Initiated Contact with Patient 07/02/23 [phone]     (approximate)   History   Rectal Bleeding   HPI Cheryl [name] is a 77 y.o. female presenting today for rectal bleeding.  Patient states noting that when she wiped her bottom last night she noticed blood on the tissue paper.  She reports not having any bowel movements for at least a week and a lot of straining on the toilet.  She has not had any black stools.  No prior history of rectal bleeding, hemorrhoids, or GI bleed.  Not on blood thinners.  No abdominal pain, lightheadedness.     Physical Exam   Triage Vital Signs: ED Triage Vitals  Encounter Vitals Group     BP      Systolic BP Percentile      Diastolic BP Percentile      Pulse      Resp      Temp      Temp src      SpO2      Weight      Height      Head Circumference      Peak Flow      Pain Score      Pain Loc      Pain Education      Exclude from Growth Chart     Most recent vital signs: Vitals:   07/02/23 0730 07/02/23 0800  BP: (!) 166/78 (!) 174/84  Pulse: 84 75  Resp:  16  Temp:    SpO2: 97% 99%    Physical Exam: I have reviewed the vital signs and nursing notes. General: Awake, alert, no acute distress.  Nontoxic appearing. Head:  Atraumatic, normocephalic.   ENT:  EOM intact, PERRL. Oral mucosa is pink and moist with no lesions. Neck: Neck is supple with full range of motion, No meningeal signs. Cardiovascular:  RRR, No murmurs. Peripheral pulses palpable and equal bilaterally. Respiratory:  Symmetrical chest wall expansion.  No rhonchi, rales, or wheezes.  Good air movement throughout.  No use of accessory muscles.   Musculoskeletal:  No cyanosis or edema. Moving extremities with full ROM Abdomen:  Soft, nontender, nondistended. Rectal: No obvious anal fissure or external hemorrhoid present.  Scant red blood noted on exam of the rectal vault without signs of  stool ball or black and tarry stool. Neuro:  GCS 15, moving all four extremities, interacting appropriately. Speech clear. Psych:  Calm, appropriate.   Skin:  Warm, dry, no rash.    ED Results / Procedures / Treatments   Labs (all labs ordered are listed, but only abnormal results are displayed) Labs Reviewed  CBC WITH DIFFERENTIAL/PLATELET - Abnormal; Notable for the following components:      Result Value   WBC 14.8 (*)    MCHC 36.9 (*)    Neutro Abs 8.8 (*)    Lymphs Abs 4.2 (*)    Monocytes Absolute 1.4 (*)    Abs Immature Granulocytes 0.23 (*)    All other components within normal limits  BASIC METABOLIC PANEL - Abnormal; Notable for the following components:   Sodium 133 (*)    Potassium 3.2 (*)    Calcium 8.4 (*)    All other components within normal limits  TYPE AND SCREEN     EKG    RADIOLOGY CT imaging of abdomen/pelvis independently interpreted.  See ED course for my interpretation and  KUB.   PROCEDURES:  Critical Care performed: No  Procedures   MEDICATIONS ORDERED IN ED: Medications  iohexol (OMNIPAQUE) 350 MG/ML injection 100 mL (100 mLs Intravenous Contrast Given 07/02/23 0929)     IMPRESSION / MDM / ASSESSMENT AND PLAN / ED COURSE  I reviewed the triage vital signs and the nursing notes.                              Differential diagnosis includes, but is not limited to, internal hemorrhoid, external hemorrhoid, anal fissure, less likely upper GI bleed or diverticular bleed.  Patient's presentation is most consistent with acute complicated illness / injury requiring diagnostic workup.  Patient is a 78 year old female presenting today for rectal bleeding.  No abdominal pain symptoms.  Rectal exam shows no external hemorrhoid or anal fissure.  No black and tarry stool collected.  There was scant bright red blood noted. Patient is not on blood thinners and has not had a bowel movement in a week.  She notes straining a lot to try to have a bowel  movement without relief.  Suspect she is likely developed an internal hemorrhoid from this.  When going to reassess patient, daughter was in the room stating she also had some discharge 1 week ago from this area that looked mucousy.  This is not occurred since.  She was concerned about possible infection.  Follow-up CT imaging shows what looks like moderate stool burden throughout as well as likely internal hemorrhoid in the anal region.  There was incidental note of an infrarenal aortic aneurysm which patient will be given referral to vascular surgery.  Also gave referral to general surgery for what is likely internal hemorrhoid but so they can follow to make sure there is no evidence of neoplasm as potentially suggested by the CT but not seen on my rectal exam.  Patient stable for discharge with bowel regimen and given strict return precautions.  The patient is on the cardiac monitor to evaluate for evidence of arrhythmia and/or significant heart rate changes. Clinical Course as of 07/02/23 1120  Thu Jul 02, 2023  0823 DG Abdomen 1 View Independently interpreted abdominal x-ray with stool noted throughout the entire colon.  No evidence of rectal ball. [DW]  0841 CBC with Differential(!) WBC mildly elevated the patient has been on steroids recently.  No other infectious symptoms present.  No evidence of anemia today. [DW]  [id number] Basic metabolic panel(!) Mild hypokalemia otherwise unremarkable [DW]  1102 CT Angio Abd/Pel W and/or Wo Contrast Moderate stool burden throughout.  Incidental finding noted by radiologist of 2.9 cm infrarenal aortic aneurysm.  Will give patient referral for vascular to follow-up outpatient.  Also noting likely hemorrhoid and anal region.  Will give follow-up for ongoing management. [DW]    Clinical Course User Index [DW] [name], MD     FINAL CLINICAL IMPRESSION(S) / ED DIAGNOSES   Final diagnoses:  Constipation, unspecified constipation type  Internal  hemorrhoid  Infrarenal abdominal aortic aneurysm (AAA) without rupture (HCC)     Rx / DC Orders   ED Discharge Orders          Ordered    senna (SENOKOT) 8.6 MG TABS tablet  Daily        07/02/23 1118    Ambulatory referral to Vascular Surgery       Comments: Incidental finding of infrarenal aortic aneursym. Outpatient monitoring   07/02/23 1118  Ambulatory referral to General Surgery       Comments: Internal hemorrhoid but with CT recommending further evaluation to rule out neoplasm   07/02/23 1120             Note:  This document was prepared using Dragon voice recognition software and may include unintentional dictation errors.   [name], MD 07/02/23 [phone]

## 2023-07-06 NOTE — Group Note (Deleted)

## 2023-07-08 ENCOUNTER — Encounter: Payer: Self-pay | Admitting: Dermatology

## 2023-07-08 ENCOUNTER — Ambulatory Visit (INDEPENDENT_AMBULATORY_CARE_PROVIDER_SITE_OTHER): Payer: Medicare HMO | Admitting: Dermatology

## 2023-07-08 DIAGNOSIS — T148XXA Other injury of unspecified body region, initial encounter: Secondary | ICD-10-CM

## 2023-07-08 DIAGNOSIS — L308 Other specified dermatitis: Secondary | ICD-10-CM

## 2023-07-08 DIAGNOSIS — R21 Rash and other nonspecific skin eruption: Secondary | ICD-10-CM

## 2023-07-08 DIAGNOSIS — Z7189 Other specified counseling: Secondary | ICD-10-CM

## 2023-07-08 NOTE — Patient Instructions (Addendum)
Recommend Bleed Stop powder if needed  Wound Care Instructions  Cleanse wound gently with soap and water once a day then pat dry with clean gauze. Apply a thin coat of Petrolatum (petroleum jelly, "Vaseline") over the wound (unless you have an allergy to this). We recommend that you use a new, sterile tube of Vaseline. Do not pick or remove scabs. Do not remove the yellow or white "healing tissue" from the base of the wound.  Cover the wound with fresh, clean, nonstick gauze and secure with paper tape. You may use Band-Aids in place of gauze and tape if the wound is small enough, but would recommend trimming much of the tape off as there is often too much. Sometimes Band-Aids can irritate the skin.  You should call the office for your biopsy report after 1 week if you have not already been contacted.  If you experience any problems, such as abnormal amounts of bleeding, swelling, significant bruising, significant pain, or evidence of infection, please call the office immediately.  FOR ADULT SURGERY PATIENTS: If you need something for pain relief you may take 1 extra strength Tylenol (acetaminophen) AND 2 Ibuprofen (200mg  each) together every 4 hours as needed for pain. (do not take these if you are allergic to them or if you have a reason you should not take them.) Typically, you may only need pain medication for 1 to 3 days.   Due to recent changes in healthcare laws, you may see results of your pathology and/or laboratory studies on MyChart before the doctors have had a chance to review them. We understand that in some cases there may be results that are confusing or concerning to you. Please understand that not all results are received at the same time and often the doctors may need to interpret multiple results in order to provide you with the best plan of care or course of treatment. Therefore, we ask that you please give [place] 2 business days to thoroughly review all your results before contacting  the office for clarification. Should we see a critical lab result, you will be contacted sooner.   If You Need Anything After Your Visit  If you have any questions or concerns for your doctor, please call our main line at [phone] and press option 4 to reach your doctor's medical assistant. If no one answers, please leave a voicemail as directed and we will return your call as soon as possible. Messages left after 4 pm will be answered the following business day.   You may also send [place] a message via MyChart. We typically respond to MyChart messages within 1-2 business days.  For prescription refills, please ask your pharmacy to contact our office. Our fax number is [phone].  If you have an urgent issue when the clinic is closed that cannot wait until the next business day, you can page your doctor at the number below.    Please note that while we do our best to be available for urgent issues outside of office hours, we are not available 24/7.   If you have an urgent issue and are unable to reach [place], you may choose to seek medical care at your doctor's office, retail clinic, urgent care center, or emergency room.  If you have a medical emergency, please immediately call 911 or go to the emergency department.  Pager Numbers  - Dr. Gwen Pounds: [phone]  - Dr. Roseanne Reno: [phone]  - Dr. Katrinka Blazing: [phone]   In the event of inclement weather,  please call our main line at [phone] for an update on the status of any delays or closures.  Dermatology Medication Tips: Please keep the boxes that topical medications come in in order to help keep track of the instructions about where and how to use these. Pharmacies typically print the medication instructions only on the boxes and not directly on the medication tubes.   If your medication is too expensive, please contact our office at [phone] option 4 or send [place] a message through MyChart.   We are unable to tell what your  co-pay for medications will be in advance as this is different depending on your insurance coverage. However, we may be able to find a substitute medication at lower cost or fill out paperwork to get insurance to cover a needed medication.   If a prior authorization is required to get your medication covered by your insurance company, please allow [place] 1-2 business days to complete this process.  Drug prices often vary depending on where the prescription is filled and some pharmacies may offer cheaper prices.  The website www.goodrx.com contains coupons for medications through different pharmacies. The prices here do not account for what the cost may be with help from insurance (it may be cheaper with your insurance), but the website can give you the price if you did not use any insurance.  - You can print the associated coupon and take it with your prescription to the pharmacy.  - You may also stop by our office during regular business hours and pick up a GoodRx coupon card.  - If you need your prescription sent electronically to a different pharmacy, notify our office through [facility] or by phone at [phone] option 4.     Si Usted Necesita Algo Despus de Su Visita  Tambin puede enviarnos un mensaje a travs de Clinical cytogeneticist. Por lo general respondemos a los mensajes de MyChart en el transcurso de 1 a 2 das hbiles.  Para renovar recetas, por favor pida a su farmacia que se ponga en contacto con nuestra oficina. Annie Sable de fax es [place] [phone].  Si tiene un asunto urgente cuando la clnica est cerrada y que no puede esperar hasta el siguiente da hbil, puede llamar/localizar a su doctor(a) al nmero que aparece a continuacin.   Por favor, tenga en cuenta que aunque hacemos todo lo posible para estar disponibles para asuntos urgentes fuera del horario de [place], no estamos disponibles las 24 horas del da, los 7 [address]  [address] de la [place].   Si tiene un problema urgente y no  puede comunicarse con nosotros, puede optar por buscar atencin mdica  en el consultorio de su doctor(a), en una clnica privada, en un centro de atencin urgente o en una sala de emergencias.  Si tiene Engineer, drilling, por favor llame inmediatamente al 911 o vaya a la sala de emergencias.  Nmeros de bper  - Dr. Gwen Pounds: [phone]  - Dra. Roseanne Reno: [phone]  - Dr. Katrinka Blazing: [phone]   En caso de inclemencias del tiempo, por favor llame a Lacy Duverney principal al [phone] para una actualizacin sobre el [place] de cualquier retraso o cierre.  Consejos para la medicacin en dermatologa: Por favor, guarde las cajas en las que vienen los medicamentos de uso tpico para ayudarle a seguir las instrucciones sobre dnde y cmo usarlos. Las farmacias generalmente imprimen las instrucciones del medicamento slo en las cajas y no directamente en los tubos del [place].   Si su medicamento es [employer]  caro, por favor, pngase en contacto con nuestra oficina llamando al [phone] y presione la opcin 4 o envenos un mensaje a travs de Clinical cytogeneticist.   No podemos decirle cul ser su copago por los medicamentos por adelantado ya que esto es diferente dependiendo de la cobertura de su seguro. Sin embargo, es posible que podamos encontrar un medicamento sustituto a Audiological scientist un formulario para que el seguro cubra el medicamento que se considera necesario.   Si se requiere una autorizacin previa para que su compaa de seguros [place] su medicamento, por favor permtanos de 1 a 2 das hbiles para completar [address]  Los precios de los medicamentos varan con frecuencia dependiendo del Environmental consultant de dnde se surte la receta y alguna farmacias pueden ofrecer precios ms baratos.  El sitio web www.goodrx.com tiene cupones para medicamentos de Health and safety inspector. Los precios aqu no tienen en cuenta lo que podra costar con la ayuda del seguro (puede ser ms barato con su seguro),  pero el sitio web puede darle el precio si no utiliz Tourist information centre manager.  - Puede imprimir el cupn correspondiente y llevarlo con su receta a la farmacia.  - Tambin puede pasar por nuestra oficina durante el horario de atencin regular y Education officer, museum una tarjeta de cupones de GoodRx.  - Si necesita que su receta se enve electrnicamente a una farmacia diferente, informe a nuestra oficina a travs de MyChart de  o por telfono llamando al [phone] y presione la opcin 4.

## 2023-07-08 NOTE — Progress Notes (Signed)
Follow Up Visit   Subjective  Cheryl [name] is a 77 y.o. female who presents for the following: Rash. Severely pruritic. Patient reports bugs in skin that need to be picked out. Patient feels that skin is infected and needs to be treated with an antimicrobial. Daughter states missed 2 week follow up. Have had exterminators treat the home. States has no bugs in the house. Patient and daughter want to know what this rash is. Used HC ointment as directed, took prednisone as directed. Has been to [facility] ED 3 times regarding this rash since last visit.   06/15/2023: Was given Keflex 500 mg QID and Mupirocin ointment by ED  06/20/2023: Was given Solu-Medrol 80 mg IV and HC 0.5% cream by ED . 06/23/2023: Was given Solu-Medrol 60 mg injection, Prednisone 10 mg and advised to fill HC cream.  Prednisone temporarily improves condition but then it recurs.  [facility] ED 06/28/2023: labs ordered  Daughter, Romana Juniper, is with patient and contributes to history.   The following portions of the chart were reviewed this encounter and updated as appropriate: medications, allergies, medical history  Review of Systems:  No other skin or systemic complaints except as noted in HPI or Assessment and Plan.  Objective  Well appearing patient in no apparent distress; mood and affect are within normal limits.  A focused examination was performed of the following areas: Legs, arms  Relevant exam findings are noted in the Assessment and Plan.  right medial elbow (2) Scattered numerous angulated and circular geometric ulcerations down to subcutaneous fat with reactive erythematous borders on bilateral lower and right upper extremities Patient and daughter pointed to two 1 millimeter red telangiectatic papules on left lower leg as evidence of early lesions. Patient stated it is an insect in the skin                 Assessment & Plan   Rash right medial elbow; right medial elbow  No signs of infection on  exam. Lesions represent secondary changes with no primary lesion. No indication of a primary process. No clinical cause for severe diffuse pruritus. Lesions are felt to be secondary to the picking that patient reports doing.  Labs thus far have been reassuring. Sending additional labs to rule out other causes of pruritus without rash. (TSH FT4 SPEP)  Patient pointed to lesion on right medial elbow as an early lesion. Developed last night. I stated that it looks similar to an arthropod bite. Patient stated no bites were sustained last night. Biopsied this lesion for H&E. Did additional biopsy for DIF to rule out occult bullous pemphigoid or other immunobullous diseases  Stated that prednisone is anti-inflammatory and would typically worsen an infection rather than improve it. Due to a lack of evidence of infection, cannot prescribe an antimicrobial  Skin / nail biopsy - right medial elbow Type of biopsy: punch   Informed consent: discussed and consent obtained   Timeout: patient name, date of birth, surgical site, and procedure verified   Procedure prep:  Patient was prepped and draped in usual sterile fashion Prep type:  Isopropyl alcohol Anesthesia: the lesion was anesthetized in a standard fashion   Anesthetic:  1% lidocaine w/ epinephrine 1-100,000 buffered w/ 8.4% NaHCO3 Punch size:  4 mm Suture size:  4-0 Suture type: nylon   Hemostasis achieved with: suture   Outcome: patient tolerated procedure well   Post-procedure details: wound care instructions given   Additional details:  Petrolatum and a pressure dressing were  applied  Skin / nail biopsy - right medial elbow Type of biopsy: punch   Informed consent: discussed and consent obtained   Timeout: patient name, date of birth, surgical site, and procedure verified   Procedure prep:  Patient was prepped and draped in usual sterile fashion Prep type:  Isopropyl alcohol Anesthesia: the lesion was anesthetized in a standard fashion    Anesthetic:  1% lidocaine w/ epinephrine 1-100,000 buffered w/ 8.4% NaHCO3 Punch size:  4 mm Suture size:  4-0 Suture type: nylon   Hemostasis achieved with: suture   Outcome: patient tolerated procedure well   Post-procedure details: wound care instructions given   Additional details:  Petrolatum and a pressure dressing were applied  Specimen 1 - Surgical pathology Differential Diagnosis: bullous pemphigoid vs epidermolysis bullosa acquisita vs arthropod bite vs urticaria  vs pemphigus vulgaris  Check Margins: No Angulated geometric excoriations  Specimen 2 - Surgical pathology Differential Diagnosis: bullous pemphigoid vs epidermolysis bullosa acquisita vs pemphigus vulgaris  Check Margins: No Scattered numerous angulated geometric ulcerations down to subcutaneous fat with reactive erythematous borders on bilateral upper and lower extremities DIF  Related Procedures Protein Electrophoresis, Serum TSH T4, Free  Counseling and coordination of care     Return in about 13 days (around 07/21/2023) for Suture Removal, with Dr. Katrinka Blazing.  Anise Salvo, RMA, am acting as scribe for Elie Goody, MD .  I Elie Goody, MD, personally spent 45 minutes in the room with the patient conducting the visit and 15 minutes reviewing medical records and lab results (in Epic and paper copies provided by patient and daughter)  Documentation: I have reviewed the above documentation for accuracy and completeness, and I agree with the above.  Elie Goody, MD

## 2023-07-09 ENCOUNTER — Encounter: Payer: Self-pay | Admitting: Dermatology

## 2023-07-10 LAB — SURGICAL PATHOLOGY

## 2023-07-14 ENCOUNTER — Ambulatory Visit (INDEPENDENT_AMBULATORY_CARE_PROVIDER_SITE_OTHER): Payer: Medicare HMO | Admitting: Dermatology

## 2023-07-14 ENCOUNTER — Encounter: Payer: Self-pay | Admitting: Dermatology

## 2023-07-14 DIAGNOSIS — S40811A Abrasion of right upper arm, initial encounter: Secondary | ICD-10-CM

## 2023-07-14 DIAGNOSIS — T148XXA Other injury of unspecified body region, initial encounter: Secondary | ICD-10-CM

## 2023-07-14 DIAGNOSIS — S80811A Abrasion, right lower leg, initial encounter: Secondary | ICD-10-CM | POA: Diagnosis not present

## 2023-07-14 DIAGNOSIS — Z7189 Other specified counseling: Secondary | ICD-10-CM

## 2023-07-14 DIAGNOSIS — Z4802 Encounter for removal of sutures: Secondary | ICD-10-CM

## 2023-07-14 DIAGNOSIS — S40812A Abrasion of left upper arm, initial encounter: Secondary | ICD-10-CM

## 2023-07-14 DIAGNOSIS — E039 Hypothyroidism, unspecified: Secondary | ICD-10-CM

## 2023-07-14 DIAGNOSIS — S80812A Abrasion, left lower leg, initial encounter: Secondary | ICD-10-CM

## 2023-07-14 LAB — PROTEIN ELECTROPHORESIS, SERUM
A/G Ratio: 1.2 (ref 0.7–1.7)
Albumin ELP: 3.3 g/dL (ref 2.9–4.4)
Alpha 1: 0.4 g/dL (ref 0.0–0.4)
Alpha 2: 1 g/dL (ref 0.4–1.0)
Beta: 0.8 g/dL (ref 0.7–1.3)
Gamma Globulin: 0.6 g/dL (ref 0.4–1.8)
Globulin, Total: 2.7 g/dL (ref 2.2–3.9)
Total Protein: 6 g/dL (ref 6.0–8.5)

## 2023-07-14 LAB — T4, FREE: Free T4: 0.81 ng/dL — ABNORMAL LOW (ref 0.82–1.77)

## 2023-07-14 LAB — TSH: TSH: 5.06 u[IU]/mL — ABNORMAL HIGH (ref 0.450–4.500)

## 2023-07-14 NOTE — Progress Notes (Signed)
   Follow-Up Visit   Subjective  Cheryl [name] is a 77 y.o. female who presents for the following: patient wanted to have biopsy site checked. She said it's sore and looks infected.  The patient has spots, moles and lesions to be evaluated, some may be new or changing and the patient may have concern these could be cancer.   The following portions of the chart were reviewed this encounter and updated as appropriate: medications, allergies, medical history  Review of Systems:  No other skin or systemic complaints except as noted in HPI or Assessment and Plan.  Objective  Well appearing patient in no apparent distress; mood and affect are within normal limits.   A focused examination was performed of the following areas: Arms, legs  Relevant exam findings are noted in the Assessment and Plan.    Assessment & Plan   Excoriations on extremities, healing since last visit Exam: geometric angulated excoriations down to subcutis on extremities, healing since last visit Dehisced punch biopsy sites on right medial elbow. Removed sutures with forceps and suture scissors  Results: normal SPEP, normal DIF, high TSH with low FT4, H&E shows arthropod bite  Differential diagnosis:  Neurogenic excoriations  Treatment Plan: Emphasized avoiding picking at lesions Cover lesions with vaseline at least daily and they will heal if not traumatized Photos taken to show healing since last visit Do not recommend prednisone given long-term side effects and lack of an inflammatory condition to treat Do not recommend antimicrobials given lack of evidence of infection Reviewed labs and biopsies with patient. Recommend she follow up with PCP for low thyroid. Patient and daughter prefer referral to specialist for thyroid Sent referral to endocrinologist.            Encounter for Removal of Sutures - Incision site at the right medial elbow is clean, dry and intact - Wound cleansed, sutures  removed, wound cleansed and steri strips applied.  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   - Patient advised to keep steri-strips dry until they fall off. - Scars remodel for a full year. - Once steri-strips fall off, patient can apply over-the-counter silicone scar cream each night to help with scar remodeling if desired. - Patient advised to call with any concerns or if they notice any new or changing lesions.  Hypothyroidism, unspecified type  Related Procedures Ambulatory referral to Endocrinology    Return if symptoms worsen or fail to improve.  Anise Salvo, RMA, am acting as scribe for Elie Goody, MD .   Documentation: I have reviewed the above documentation for accuracy and completeness, and I agree with the above.  Elie Goody, MD

## 2023-07-14 NOTE — Patient Instructions (Signed)
Due to recent changes in healthcare laws, you may see results of your pathology and/or laboratory studies on MyChart before the doctors have had a chance to review them. We understand that in some cases there may be results that are confusing or concerning to you. Please understand that not all results are received at the same time and often the doctors may need to interpret multiple results in order to provide you with the best plan of care or course of treatment. Therefore, we ask that you please give [place] 2 business days to thoroughly review all your results before contacting the office for clarification. Should we see a critical lab result, you will be contacted sooner.   If You Need Anything After Your Visit  If you have any questions or concerns for your doctor, please call our main line at [phone] and press option 4 to reach your doctor's medical assistant. If no one answers, please leave a voicemail as directed and we will return your call as soon as possible. Messages left after 4 pm will be answered the following business day.   You may also send [place] a message via MyChart. We typically respond to MyChart messages within 1-2 business days.  For prescription refills, please ask your pharmacy to contact our office. Our fax number is [phone].  If you have an urgent issue when the clinic is closed that cannot wait until the next business day, you can page your doctor at the number below.    Please note that while we do our best to be available for urgent issues outside of office hours, we are not available 24/7.   If you have an urgent issue and are unable to reach [place], you may choose to seek medical care at your doctor's office, retail clinic, urgent care center, or emergency room.  If you have a medical emergency, please immediately call 911 or go to the emergency department.  Pager Numbers  - Dr. Gwen Pounds: [phone]  - Dr. Roseanne Reno: [phone]  - Dr. Katrinka Blazing: [phone]    In the event of inclement weather, please call our main line at [phone] for an update on the status of any delays or closures.  Dermatology Medication Tips: Please keep the boxes that topical medications come in in order to help keep track of the instructions about where and how to use these. Pharmacies typically print the medication instructions only on the boxes and not directly on the medication tubes.   If your medication is too expensive, please contact our office at [phone] option 4 or send [place] a message through MyChart.   We are unable to tell what your co-pay for medications will be in advance as this is different depending on your insurance coverage. However, we may be able to find a substitute medication at lower cost or fill out paperwork to get insurance to cover a needed medication.   If a prior authorization is required to get your medication covered by your insurance company, please allow [place] 1-2 business days to complete this process.  Drug prices often vary depending on where the prescription is filled and some pharmacies may offer cheaper prices.  The website www.goodrx.com contains coupons for medications through different pharmacies. The prices here do not account for what the cost may be with help from insurance (it may be cheaper with your insurance), but the website can give you the price if you did not use any insurance.  - You can print the associated coupon and take it  with your prescription to the pharmacy.  - You may also stop by our office during regular business hours and pick up a GoodRx coupon card.  - If you need your prescription sent electronically to a different pharmacy, notify our office through [facility] or by phone at [phone] option 4.     Si Usted Necesita Algo Despus de Su Visita  Tambin puede enviarnos un mensaje a travs de Clinical cytogeneticist. Por lo general respondemos a los mensajes de MyChart en el transcurso de 1 a 2 das  hbiles.  Para renovar recetas, por favor pida a su farmacia que se ponga en contacto con nuestra oficina. Annie Sable de fax es [place] [phone].  Si tiene un asunto urgente cuando la clnica est cerrada y que no puede esperar hasta el siguiente da hbil, puede llamar/localizar a su doctor(a) al nmero que aparece a continuacin.   Por favor, tenga en cuenta que aunque hacemos todo lo posible para estar disponibles para asuntos urgentes fuera del horario de [place], no estamos disponibles las 24 horas del da, los 7 [address]  [address] de la [place].   Si tiene un problema urgente y no puede comunicarse con nosotros, puede optar por buscar atencin mdica  en el consultorio de su doctor(a), en una clnica privada, en un centro de atencin urgente o en una sala de emergencias.  Si tiene Engineer, drilling, por favor llame inmediatamente al 911 o vaya a la sala de emergencias.  Nmeros de bper  - Dr. Gwen Pounds: [phone]  - Dra. Roseanne Reno: [phone]  - Dr. Katrinka Blazing: [phone]   En caso de inclemencias del tiempo, por favor llame a Lacy Duverney principal al [phone] para una actualizacin sobre el [place] de cualquier retraso o cierre.  Consejos para la medicacin en dermatologa: Por favor, guarde las cajas en las que vienen los medicamentos de uso tpico para ayudarle a seguir las instrucciones sobre dnde y cmo usarlos. Las farmacias generalmente imprimen las instrucciones del medicamento slo en las cajas y no directamente en los tubos del [place].   Si su medicamento es muy caro, por favor, pngase en contacto con Rolm Gala llamando al [phone] y presione la opcin 4 o envenos un mensaje a travs de Clinical cytogeneticist.   No podemos decirle cul ser su copago por los medicamentos por adelantado ya que esto es diferente dependiendo de la cobertura de su seguro. Sin embargo, es posible que podamos encontrar un medicamento sustituto a Audiological scientist un formulario para que el  seguro cubra el medicamento que se considera necesario.   Si se requiere una autorizacin previa para que su compaa de seguros [place] su medicamento, por favor permtanos de 1 a 2 das hbiles para completar [address]  Los precios de los medicamentos varan con frecuencia dependiendo del Environmental consultant de dnde se surte la receta y alguna farmacias pueden ofrecer precios ms baratos.  El sitio web www.goodrx.com tiene cupones para medicamentos de Health and safety inspector. Los precios aqu no tienen en cuenta lo que podra costar con la ayuda del seguro (puede ser ms barato con su seguro), pero el sitio web puede darle el precio si no utiliz Tourist information centre manager.  - Puede imprimir el cupn correspondiente y llevarlo con su receta a la farmacia.  - Tambin puede pasar por nuestra oficina durante el horario de atencin regular y Education officer, museum una tarjeta de cupones de GoodRx.  - Si necesita que su receta se enve electrnicamente a Psychiatrist, informe a nuestra oficina a travs de MyChart de  [place] o por telfono llamando al [phone] y presione la opcin 4.

## 2023-07-20 ENCOUNTER — Ambulatory Visit: Payer: Self-pay | Admitting: *Deleted

## 2023-07-20 NOTE — Telephone Encounter (Signed)
Summary: Infection symptoms, not established. seeking urgent care   Pt has possible infection symptoms she claims, she has sores on her body and is unsure why. She claims she has a "ball of infection" that she thought was a hemorrhoid but its not apparently.         Attempted to call patient- no answer- left message to cal NT

## 2023-07-20 NOTE — Telephone Encounter (Signed)
  Summary: Infection symptoms, not established. seeking urgent care     Pt has possible infection symptoms she claims, she has sores on her body and is unsure why. She claims she has a "ball of infection" that she thought was a hemorrhoid but its not apparently.           Chief Complaint: boil to anus Symptoms: blown mucus and foul smelling, size of ping pong ball. Of note: she has "sores to bilateral lower legs and 1 on right arm Frequency: "several weeks" Pertinent Negatives: Patient denies fever Disposition: [] ED /[] Urgent Care (no appt availability in office) / [] Appointment(In office/virtual)/ []  [facility]/ [] Home Care/ [x] Refused Recommended Disposition /[] [place] [facility]/ []  Follow-up with PCP Additional Notes: pt called on community line . Refused UC or ED stated that she has been there and they don't do anything about it called Dr Allena Katz and advised answering service to notify nurse or PCP to call pt.   Reason for Disposition  [1] Boil > 1/2 inch across (> 12 mm; larger than a marble) AND [2] center is soft or pus colored  Answer Assessment - Initial Assessment Questions 1. APPEARANCE of BOIL: "What does the boil look like?"      Unable to visulaize 2. LOCATION: "Where is the boil located?"      anus 3. NUMBER: "How many boils are there?"      1 boil  4. SIZE: "How big is the boil?" (e.g., inches, cm; compare to size of a coin or other object)     Ping pong sized 5. ONSET: "When did the boil start?"     "Several weeks" 6. PAIN: "Is there any pain?" If Yes, ask: "How bad is the pain?"   (Scale 1-10; or mild, moderate, severe)     yes 7. FEVER: "Do you have a fever?" If Yes, ask: "What is it, how was it measured, and when did it start?"      no 9. OTHER SYMPTOMS: "Do you have any other symptoms?" (e.g., shaking chills, weakness, rash elsewhere on body)     Has sores bilateral lower legs, right arm, brown discharge foul smelling "feels like a ball of  infection, anxiety from the boil and going to ED and UC without help. Pt has a dermatologist, Pt asking for help today and stated that she needs it taken care of "because I know it's infected"/has to wear disposable briefs to catch the musus and drainage. Pt stated she has to stand to [facility] or void. Pt tearful. Emotional support and reassurance that she will receive help.  Protocols used: Boil (Skin Abscess)-A-AH

## 2023-07-21 ENCOUNTER — Ambulatory Visit: Payer: Medicare HMO | Admitting: Dermatology

## 2023-07-28 ENCOUNTER — Encounter: Payer: Self-pay | Admitting: General Surgery

## 2023-07-28 ENCOUNTER — Ambulatory Visit: Payer: Self-pay | Admitting: General Surgery

## 2023-07-28 VITALS — BP 127/95 | HR 94 | Temp 98.3°F | Ht 60.0 in | Wt 116.6 lb

## 2023-07-28 DIAGNOSIS — R197 Diarrhea, unspecified: Secondary | ICD-10-CM

## 2023-07-28 DIAGNOSIS — K625 Hemorrhage of anus and rectum: Secondary | ICD-10-CM

## 2023-07-28 DIAGNOSIS — K640 First degree hemorrhoids: Secondary | ICD-10-CM

## 2023-07-28 DIAGNOSIS — K5900 Constipation, unspecified: Secondary | ICD-10-CM | POA: Diagnosis not present

## 2023-07-28 NOTE — Patient Instructions (Signed)
Advised to pursue a goal of 25 to 30 g of fiber daily.  Made aware that the majority of this may be through natural sources, but advised to be aware of actual consumption and to ensure minimal consumption by daily supplementation.  Various forms of supplements discussed.  Recommended Psyllium husk, that mixes well with applesauce, or the powder which goes down well shaken with chocolate milk.  Strongly advised to consume more fluids to ensure adequate hydration, instructed to watch color of urine to determine adequacy of hydration.  Clarity is pursued in urine output, and bowel activity that correlates to significant meal intake.   We need to avoid deferring having bowel movements, advised to take the time at the first sign of sensation, typically following meals, and in the morning.   Subsequent utilization of MiraLAX may be needed ensure at least daily movement, ideally twice daily bowel movements.  If multiple doses of MiraLAX are necessary utilize them. Never skip a day...  To be regular, we must do the above EVERY day.    Hemorrhoids Hemorrhoids are swollen veins that may form: In the butt (rectum). These are called internal hemorrhoids. Around the opening of the butt (anus). These are called external hemorrhoids. Most hemorrhoids do not cause very bad problems. They often get better with changes to your lifestyle and what you eat. What are the causes? Having trouble pooping (constipation) or watery poop (diarrhea). Pushing too hard when you poop. Pregnancy. Being very overweight (obese). Sitting for too long. Riding a bike for a long time. Heavy lifting or other things that take a lot of effort. Anal sex. What are the signs or symptoms? Pain. Itching or soreness in the butt. Bleeding from the butt. Leaking poop. Swelling. One or more lumps around the opening of your butt. How is this treated? In most cases, hemorrhoids can be treated at home. You may be told to: Change what you  eat. Make changes to your lifestyle. If these treatments do not help, you may need to have a procedure done. Your doctor may need to: Place rubber bands at the bottom of the hemorrhoids to make them fall off. Put medicine into the hemorrhoids to shrink them. Shine a type of light on the hemorrhoids to cause them to fall off. Do surgery to get rid of the hemorrhoids. Follow these instructions at home: Medicines Take over-the-counter and prescription medicines only as told by your doctor. Use creams with medicine in them or medicines that you put in your butt as told by your doctor. Eating and drinking  Eat foods that have a lot of fiber in them. These include whole grains, beans, nuts, fruits, and vegetables. Ask your doctor about taking products that have fiber added to them (fibersupplements). Take in less fat. You can do this by: Eating low-fat dairy products. Eating less red meat. Staying away from processed foods. Drink enough fluid to keep your pee (urine) pale yellow. Managing pain and swelling  Take a warm-water bath (sitz bath) for 20 minutes to ease pain. Do this 3-4 times a day. You may do this in a bathtub. You may also use a portable sitz bath that fits over the toilet. If told, put ice on the painful area. It may help to use ice between your warm baths. Put ice in a plastic bag. Place a towel between your skin and the bag. Leave the ice on for 20 minutes, 2-3 times a day. If your skin turns bright red, take off the ice  right away to prevent skin damage. The risk of damage is higher if you cannot feel pain, heat, or cold. General instructions Exercise. Ask your doctor how much and what kind of exercise is best for you. Go to the bathroom when you need to poop. Do not wait. Try not to push too hard when you poop. Keep your butt dry and clean. Use wet toilet paper or moist towelettes after you poop. Do not sit on the toilet for a long time. Contact a doctor if: You have  pain and swelling that do not get better with treatment. You have trouble pooping. You cannot poop. You have pain or swelling outside the area of the hemorrhoids. Get help right away if: You have bleeding from the butt that will not stop. This information is not intended to replace advice given to you by your health care provider. Make sure you discuss any questions you have with your health care provider. Document Revised: 06/25/2022 Document Reviewed: 06/25/2022 Elsevier Patient Education  2024 [employer].

## 2023-07-28 NOTE — Progress Notes (Signed)
Patient ID: Cheryl [name], female   DOB: Nov 28, 1945, 77 y.o.   MRN: [id number]  CC: Rectal Bleeding History of Present Illness Cheryl [name] is a 77 y.o. female who presents for an ED follow-up for evaluation of rectal bleeding.  The patient states that she had no anorectal problems until about 2 weeks ago.  She noted that there was what she describes as a bubble that prolapse from her anus associated with the rectal bleeding.  She had bright red blood per rectum so she presented to the ED for evaluation.  There she had a CT scan that was largely unremarkable.  She reports that she has to wear a diaper but also has to strain when she has a bowel movement.  She says if she stands up she has liquid stool.  She has been using hemorrhoidal cream and some fiber supplementation over the last 2 weeks.  She also reports pain around her anus.  She last had a colonoscopy in 2022 and says she was told she has polyps.  She has no family history of colorectal cancers.  She has had 2 vaginal deliveries and does not remember having any tears  Past Medical History Past Medical History:  Diagnosis Date   Abdominal aneurysm (HCC)    Allergy    seasonal   Arthritis    hips   Cataract    Complication of anesthesia    facial swelling after hysterectomy   Depression    Gastric ulcer 10/27/2010   Hypertension    Hypokalemia    Hyponatremia    Lymphocytic colitis    Osteopenia    Peptic ulcer    Piriformis syndrome    Radiculopathy of lumbar region    Ulcerative colitis (HCC)    Vertigo    with sinus issues   Wears dentures    partial upper       Past Surgical History:  Procedure Laterality Date   ABDOMINAL HYSTERECTOMY     APPENDECTOMY     BREAST BIOPSY Left 2012   negative   CATARACT EXTRACTION, BILATERAL Bilateral 2005   COLONOSCOPY  2012   COLONOSCOPY WITH PROPOFOL N/A 10/11/2015   Procedure: COLONOSCOPY WITH PROPOFOL;  Surgeon: Midge Minium, MD;  [facility];  Service:  Endoscopy;  Laterality: N/A;   COLONOSCOPY WITH PROPOFOL N/A 05/24/2021   Procedure: COLONOSCOPY WITH PROPOFOL;  Surgeon: Earline Mayotte, MD;  Location: [facility] ENDOSCOPY;  Service: Gastroenterology;  Laterality: N/A;   ESOPHAGOGASTRODUODENOSCOPY N/A 05/24/2021   Procedure: ESOPHAGOGASTRODUODENOSCOPY (EGD);  Surgeon: Earline Mayotte, MD;  Location: [facility] END[facility];  Service: Gastroenterology;  Laterality: N/A;   POLYPECTOMY  10/11/2015   Procedure: POLYPECTOMY;  Surgeon: Midge Minium, MD;  Location: [facility];  Service: Endoscopy;;    Allergies  Allergen Reactions   Gramineae Pollens Other (See Comments)   Sulfa Antibiotics     Current Outpatient Medications  Medication Sig Dispense Refill   atorvastatin (LIPITOR) 20 MG tablet Take 20 mg by mouth at bedtime.     buPROPion (WELLBUTRIN SR) 150 MG 12 hr tablet Take 150 mg by mouth daily.      busPIRone (BUSPAR) 5 MG tablet Take 5 mg by mouth 2 (two) times daily.     cetirizine (ZYRTEC) 10 MG tablet Take 1 tablet by mouth twice daily 60 tablet 0   hydrocortisone cream 0.5 % Apply 1 Application topically 2 (two) times daily. 56 g 1   losartan-hydrochlorothiazide (HYZAAR) 50-12.5 MG per tablet Take 1 tablet by  mouth daily.      meclizine (ANTIVERT) 25 MG tablet Take 25 mg by mouth 2 (two) times daily as needed.      oxybutynin (DITROPAN XL) 15 MG 24 hr tablet      pantoprazole (PROTONIX) 40 MG tablet Take 40 mg by mouth daily.      predniSONE (DELTASONE) 10 MG tablet Take 1 tablet (10 mg total) by mouth daily. Day 1-2: Take 50 mg  ( 5 pills) Day 3-4 : Take 40 mg (4pills) Day 5-6: Take 30 mg (3 pills) Day 7-8:  Take 20 mg (2 pills) Day 9:  Take 10mg  (1 pill) 29 tablet 0   timolol (TIMOPTIC) 0.5 % ophthalmic solution Apply to eye.     traMADol (ULTRAM) 50 MG tablet tramadol 50 mg tablet     venlafaxine XR (EFFEXOR-XR) 75 MG 24 hr capsule venlafaxine ER 75 mg capsule,extended release 24 hr     Current Facility-Administered  Medications  Medication Dose Route Frequency Provider Last Rate Last Admin   bupivacaine (MARCAINE) 0.5 % injection 30 mL  30 mL Other Once Ewing Schlein, MD       lactated ringers infusion 1,000 mL  1,000 mL Intravenous Continuous Ewing Schlein, MD       lidocaine (PF) (XYLOCAINE) 1 % injection 10 mL  10 mL Subcutaneous Once Ewing Schlein, MD       sodium chloride 0.9 % injection 20 mL  20 mL Other Once Ewing Schlein, MD        Family History Family History  Problem Relation Age of Onset   Arthritis Mother    Diabetes Mother    Depression Mother    COPD Mother    Heart disease Mother    Hypertension Mother    Alcohol abuse Father    Stroke Father    Stroke Paternal Aunt    Breast cancer Maternal Grandmother        Social History Social History   Tobacco Use   Smoking status: Some Days    Types: Cigarettes   Smokeless tobacco: Never  Vaping Use   Vaping status: Never Used  Substance Use Topics   Alcohol use: Yes    Alcohol/week: 3.0 standard drinks of alcohol    Types: 3 Glasses of wine per week   Drug use: No        ROS Full ROS of systems performed and is otherwise negative there than what is stated in the HPI  Physical Exam Blood pressure (!) 127/95, pulse 94, temperature 98.3 F (36.8 C), temperature source Oral, height 5' (1.524 m), weight 116 lb 9.6 oz (52.9 kg), SpO2 98%.  CONSTITUTIONAL: NAD, walks with cane EYES: Pupils equal, round, and reactive to light, Sclera non-icteric. EARS, NOSE, MOUTH AND THROAT: The oropharynx is clear. Hearing is intact to voice.  NECK: Trachea is midline, and there is no jugular venous distension.  Rectal exam performed in the presence of a chaperone.  No external hemorrhoids noted.  Digital rectal exam without any obvious masses there is slight decrease of rectal tone no gross blood.  Anoscopy performed and there are very small hemorrhoidal columns more prominent in the right posterior and left lateral  positions. NEUROLOGIC:  Motor and sensation is grossly normal.  Cranial nerves are grossly intact.  Data Reviewed CT scan reviewed and there is large stool burden in the sigmoid colon.  Last labs in September showed no evidence of anemia I have personally reviewed the patient's imaging and medical records.  Assessment/Plan    77 year old that presents with rectal bleeding and fullness in the rectal vault with some prolapse of tissue.  She is concerned for hemorrhoids but on exam she has very small grade 1 hemorrhoids that are likely not contributing to her problem.  She is having mixed diarrhea and constipation and has been using some fiber supplementation over the last 2 weeks.  We will try to bulk up her stool to see if her symptoms resolved.  Included in my differential is potential rectocele or rectal prolapse although I did not see any evidence of those.  Counseled her on the need for not straining and having soft bbleeding.  We will see her back in 4 weeks.     Kandis Cocking 07/28/2023, 11:44 AM

## 2023-07-29 ENCOUNTER — Telehealth: Payer: Self-pay

## 2023-07-29 NOTE — Telephone Encounter (Signed)
Patient called in to see if she had appointment.

## 2023-07-31 ENCOUNTER — Other Ambulatory Visit: Payer: Self-pay

## 2023-07-31 ENCOUNTER — Emergency Department
Admission: EM | Admit: 2023-07-31 | Discharge: 2023-07-31 | Discharge status: Against Medical Advice | Payer: Medicare HMO | Attending: Emergency Medicine | Admitting: Emergency Medicine

## 2023-07-31 DIAGNOSIS — Z5321 Procedure and treatment not carried out due to patient leaving prior to being seen by health care provider: Secondary | ICD-10-CM | POA: Diagnosis not present

## 2023-07-31 DIAGNOSIS — K625 Hemorrhage of anus and rectum: Secondary | ICD-10-CM | POA: Diagnosis present

## 2023-07-31 NOTE — ED Triage Notes (Signed)
Pt comes with c/o some rectal bleeding. Pt states she was told she had hemorrhoid. Pt states large knot in rectal area.

## 2023-08-04 ENCOUNTER — Other Ambulatory Visit: Payer: Self-pay

## 2023-08-04 ENCOUNTER — Telehealth: Payer: Self-pay | Admitting: General Surgery

## 2023-08-04 DIAGNOSIS — K625 Hemorrhage of anus and rectum: Secondary | ICD-10-CM

## 2023-08-04 NOTE — Telephone Encounter (Signed)
Spoke with the daughter and let her know that we did sent an urgent referral to gastroenterology.

## 2023-08-04 NOTE — Telephone Encounter (Signed)
Daughter calls for her mom, she ended up back in the ER with the same issues.  Had to go by ambulance it was so bad. The daughter states that they just want to be referred to gastroenterology.  A referral looks like it may have been placed, but want to schedule asap.  Please call patient. Thank you.

## 2023-08-05 NOTE — Telephone Encounter (Addendum)
Patient called in daughter Romana Juniper) called in wanting to schedule her mother for an urgent office visit. I informed her that we don't have urgent appointments at this time. She stated that her mother has a surgery that needs to be done. I spoke to Ginger and she stated that the patient will need to go to [place] [facility] or [place] [facility]. I transfer the call to Ginger.

## 2023-08-12 ENCOUNTER — Telehealth: Payer: Self-pay

## 2023-08-12 NOTE — Telephone Encounter (Signed)
Patient daughter returned the call back transfer to [place].

## 2023-08-12 NOTE — Telephone Encounter (Signed)
She is calling about a referral we received can you call and schedule the appointment

## 2023-08-13 NOTE — Telephone Encounter (Signed)
I called the patient there no answer and I left a voicemail to call back.

## 2023-08-25 ENCOUNTER — Ambulatory Visit: Payer: Medicare HMO | Admitting: General Surgery

## 2023-09-08 ENCOUNTER — Ambulatory Visit: Payer: Medicare HMO | Admitting: Physician Assistant

## 2023-09-16 ENCOUNTER — Ambulatory Visit: Payer: Self-pay | Admitting: Surgery

## 2023-09-16 DIAGNOSIS — Z01818 Encounter for other preprocedural examination: Secondary | ICD-10-CM

## 2023-09-21 NOTE — Patient Instructions (Signed)
DUE TO COVID-19 ONLY TWO VISITORS  (aged 77 and older)  ARE ALLOWED TO COME WITH YOU AND STAY IN THE WAITING ROOM ONLY DURING PRE OP AND PROCEDURE.   **NO VISITORS ARE ALLOWED IN THE SHORT STAY AREA OR RECOVERY ROOM!!**  IF YOU WILL BE ADMITTED INTO THE HOSPITAL YOU ARE ALLOWED ONLY FOUR SUPPORT PEOPLE DURING VISITATION HOURS ONLY (7 AM -8PM)   The support person(s) must pass our screening, gel in and out, and wear a mask at all times, including in the patient's room. Patients must also wear a mask when staff or their support person are in the room. Visitors GUEST BADGE MUST BE WORN VISIBLY  One adult visitor may remain with you overnight and MUST be in the room by 8 P.M.     Your procedure is scheduled on: 10/09/23   Report to [facility] Main Entrance    Report to admitting at : 5:15 AM   Call this number if you have problems the morning of surgery [phone]   Clear liquids diet starting the day before surgery until : 4:30 AM DAY OF SURGERY  Water Black Coffee (sugar ok, NO MILK/CREAM OR CREAMERS)  Tea (sugar ok, NO MILK/CREAM OR CREAMERS) regular and decaf                             Plain Jell-O (NO RED)                                           Fruit ices (not with fruit pulp, NO RED)                                     Popsicles (NO RED)                                                                  Juice: apple, WHITE grape, WHITE cranberry Sports drinks like Gatorade (NO RED)              Drink 2 Ensure drinks AT 10:00 PM the night before surgery.    The day of surgery:  Drink ONE (1) Pre-Surgery Clear Ensure at : 4:30 AM the morning of surgery. Drink in one sitting. Do not sip.  This drink was given to you during your hospital  pre-op appointment visit. Nothing else to drink after completing the  Pre-Surgery Clear Ensure or G2.          If you have questions, please contact your surgeon's office.  FOLLOW BOWEL PREP AND ANY ADDITIONAL PRE OP INSTRUCTIONS  YOU RECEIVED FROM YOUR SURGEON'S OFFICE!!!   Oral Hygiene is also important to reduce your risk of infection.                                    Remember - BRUSH YOUR TEETH THE MORNING OF SURGERY WITH YOUR REGULAR TOOTHPASTE  DENTURES WILL BE REMOVED PRIOR TO SURGERY PLEASE DO NOT APPLY "  Poly grip" OR ADHESIVES!!!   Do NOT smoke after Midnight   Take these medicines the morning of surgery with A SIP OF WATER: buspirone,bupropion,venlafaxine,cetirizine,pantoprazole,oxybutynin.Meclizine as needed.Eye drops as usual.  DO NOT TAKE ANY ORAL DIABETIC MEDICATIONS DAY OF YOUR SURGERY                              You may not have any metal on your body including hair pins, jewelry, and body piercing             Do not wear make-up, lotions, powders, perfumes/cologne, or deodorant  Do not wear nail polish including gel and S&S, artificial/acrylic nails, or any other type of covering on natural nails including finger and toenails. If you have artificial nails, gel coating, etc. that needs to be removed by a nail salon please have this removed prior to surgery or surgery may need to be canceled/ delayed if the surgeon/ anesthesia feels like they are unable to be safely monitored.   Do not shave  48 hours prior to surgery.    Do not bring valuables to the hospital. [facility] IS NOT             RESPONSIBLE   FOR VALUABLES.   Contacts, glasses, or bridgework may not be worn into surgery.   Bring small overnight bag day of surgery.   DO NOT BRING YOUR HOME MEDICATIONS TO THE HOSPITAL. PHARMACY WILL DISPENSE MEDICATIONS LISTED ON YOUR MEDICATION LIST TO YOU DURING YOUR ADMISSION IN THE HOSPITAL!    Patients discharged on the day of surgery will not be allowed to drive home.  Someone NEEDS to stay with you for the first 24 hours after anesthesia.   Special Instructions: Bring a copy of your healthcare power of attorney and living will documents         the day of surgery if you haven't scanned them  before.              Please read over the following fact sheets you were given: IF YOU HAVE QUESTIONS ABOUT YOUR PRE-OP INSTRUCTIONS PLEASE CALL [phone]    [facility] - Preparing for Surgery Before surgery, you can play an important role.  Because skin is not sterile, your skin needs to be as free of germs as possible.  You can reduce the number of germs on your skin by washing with CHG (chlorahexidine gluconate) soap before surgery.  CHG is an antiseptic cleaner which kills germs and bonds with the skin to continue killing germs even after washing. Please DO NOT use if you have an allergy to CHG or antibacterial soaps.  If your skin becomes reddened/irritated stop using the CHG and inform your nurse when you arrive at Short Stay. Do not shave (including legs and underarms) for at least 48 hours prior to the first CHG shower.  You may shave your face/neck. Please follow these instructions carefully:  1.  Shower with CHG Soap the night before surgery and the  morning of Surgery.  2.  If you choose to wash your hair, wash your hair first as usual with your  normal  shampoo.  3.  After you shampoo, rinse your hair and body thoroughly to remove the  shampoo.                           4.  Use CHG as you would any other  liquid soap.  You can apply chg directly  to the skin and wash                       Gently with a scrungie or clean washcloth.  5.  Apply the CHG Soap to your body ONLY FROM THE NECK DOWN.   Do not use on face/ open                           Wound or open sores. Avoid contact with eyes, ears mouth and genitals (private parts).                       Wash face,  Genitals (private parts) with your normal soap.             6.  Wash thoroughly, paying special attention to the area where your surgery  will be performed.  7.  Thoroughly rinse your body with warm water from the neck down.  8.  DO NOT shower/wash with your normal soap after using and rinsing off  the CHG Soap.                 9.  Pat yourself dry with a clean towel.            10.  Wear clean pajamas.            11.  Place clean sheets on your bed the night of your first shower and do not  sleep with pets. Day of Surgery : Do not apply any lotions/deodorants the morning of surgery.  Please wear clean clothes to the hospital/surgery center.  FAILURE TO FOLLOW THESE INSTRUCTIONS MAY RESULT IN THE CANCELLATION OF YOUR SURGERY PATIENT SIGNATURE_________________________________  NURSE SIGNATURE__________________________________  ________________________________________________________________________ WHAT IS A BLOOD TRANSFUSION? Blood Transfusion Information  A transfusion is the replacement of blood or some of its parts. Blood is made up of multiple cells which provide different functions. Red blood cells carry oxygen and are used for blood loss replacement. White blood cells fight against infection. Platelets control bleeding. Plasma helps clot blood. Other blood products are available for specialized needs, such as hemophilia or other clotting disorders. BEFORE THE TRANSFUSION  Who gives blood for transfusions?  Healthy volunteers who are fully evaluated to make sure their blood is safe. This is blood bank blood. Transfusion therapy is the safest it has ever been in the practice of medicine. Before blood is taken from a donor, a complete history is taken to make sure that person has no history of diseases nor engages in risky social behavior (examples are intravenous drug use or sexual activity with multiple partners). The donor's travel history is screened to minimize risk of transmitting infections, such as malaria. The donated blood is tested for signs of infectious diseases, such as HIV and hepatitis. The blood is then tested to be sure it is compatible with you in order to minimize the chance of a transfusion reaction. If you or a relative donates blood, this is often done in anticipation of surgery and is not  appropriate for emergency situations. It takes many days to process the donated blood. RISKS AND COMPLICATIONS Although transfusion therapy is very safe and saves many lives, the main dangers of transfusion include:  Getting an infectious disease. Developing a transfusion reaction. This is an allergic reaction to something in the blood you were given. Every precaution is taken to  prevent this. The decision to have a blood transfusion has been considered carefully by your caregiver before blood is given. Blood is not given unless the benefits outweigh the risks. AFTER THE TRANSFUSION Right after receiving a blood transfusion, you will usually feel much better and more energetic. This is especially true if your red blood cells have gotten low (anemic). The transfusion raises the level of the red blood cells which carry oxygen, and this usually causes an energy increase. The nurse administering the transfusion will monitor you carefully for complications. HOME CARE INSTRUCTIONS  No special instructions are needed after a transfusion. You may find your energy is better. Speak with your caregiver about any limitations on activity for underlying diseases you may have. SEEK MEDICAL CARE IF:  Your condition is not improving after your transfusion. You develop redness or irritation at the intravenous (IV) site. SEEK IMMEDIATE MEDICAL CARE IF:  Any of the following symptoms occur over the next 12 hours: Shaking chills. You have a temperature by mouth above 102 F (38.9 C), not controlled by medicine. Chest, back, or muscle pain. People around you feel you are not acting correctly or are confused. Shortness of breath or difficulty breathing. Dizziness and fainting. You get a rash or develop hives. You have a decrease in urine output. Your urine turns a dark color or changes to pink, red, or brown. Any of the following symptoms occur over the next 10 days: You have a temperature by mouth above 102 F  (38.9 C), not controlled by medicine. Shortness of breath. Weakness after normal activity. The white part of the eye turns yellow (jaundice). You have a decrease in the amount of urine or are urinating less often. Your urine turns a dark color or changes to pink, red, or brown. Document Released: 10/10/2000 Document Revised: 01/05/2012 Document Reviewed: 05/29/2008 [facility] Patient Information 2014 [place], [place].  _______________________________________________________________________

## 2023-09-22 ENCOUNTER — Other Ambulatory Visit: Payer: Self-pay

## 2023-09-22 ENCOUNTER — Encounter (HOSPITAL_COMMUNITY): Payer: Self-pay

## 2023-09-22 ENCOUNTER — Encounter (HOSPITAL_COMMUNITY)
Admission: RE | Admit: 2023-09-22 | Discharge: 2023-09-22 | Disposition: A | Discharge status: Home / Self Care | Payer: Medicare HMO | Source: Ambulatory Visit | Attending: Surgery | Admitting: Surgery

## 2023-09-22 VITALS — BP 150/85 | HR 81 | Temp 98.5°F | Ht 60.0 in | Wt 111.0 lb

## 2023-09-22 DIAGNOSIS — I498 Other specified cardiac arrhythmias: Secondary | ICD-10-CM | POA: Insufficient documentation

## 2023-09-22 DIAGNOSIS — I10 Essential (primary) hypertension: Secondary | ICD-10-CM | POA: Insufficient documentation

## 2023-09-22 DIAGNOSIS — Z01818 Encounter for other preprocedural examination: Secondary | ICD-10-CM | POA: Diagnosis not present

## 2023-09-22 HISTORY — DX: Anxiety disorder, unspecified: F41.9

## 2023-09-22 HISTORY — DX: Pneumonia, unspecified organism: J18.9

## 2023-09-22 HISTORY — DX: Prediabetes: R73.03

## 2023-09-22 LAB — COMPREHENSIVE METABOLIC PANEL
ALT: 21 U/L (ref 0–44)
AST: 27 U/L (ref 15–41)
Albumin: 3.8 g/dL (ref 3.5–5.0)
Alkaline Phosphatase: 55 U/L (ref 38–126)
Anion gap: 14 (ref 5–15)
BUN: 11 mg/dL (ref 8–23)
CO2: 25 mmol/L (ref 22–32)
Calcium: 9.6 mg/dL (ref 8.9–10.3)
Chloride: 97 mmol/L — ABNORMAL LOW (ref 98–111)
Creatinine, Ser: 0.64 mg/dL (ref 0.44–1.00)
GFR, Estimated: >60 mL/min (ref >60)
Glucose, Bld: 108 mg/dL — ABNORMAL HIGH (ref 70–99)
Potassium: 4.3 mmol/L (ref 3.5–5.1)
Sodium: 136 mmol/L (ref 135–145)
Total Bilirubin: 0.4 mg/dL (ref <1.2)
Total Protein: 7.1 g/dL (ref 6.5–8.1)

## 2023-09-22 LAB — CBC WITH DIFFERENTIAL/PLATELET
Abs Immature Granulocytes: 0.01 10*3/uL (ref 0.00–0.07)
Basophils Absolute: 0.1 10*3/uL (ref 0.0–0.1)
Basophils Relative: 1 %
Eosinophils Absolute: 0.1 10*3/uL (ref 0.0–0.5)
Eosinophils Relative: 1 %
HCT: 37.4 % (ref 36.0–46.0)
Hemoglobin: 12.6 g/dL (ref 12.0–15.0)
Immature Granulocytes: 0 %
Lymphocytes Relative: 39 %
Lymphs Abs: 3.2 10*3/uL (ref 0.7–4.0)
MCH: 29.2 pg (ref 26.0–34.0)
MCHC: 33.7 g/dL (ref 30.0–36.0)
MCV: 86.8 fL (ref 80.0–100.0)
Monocytes Absolute: 0.8 10*3/uL (ref 0.1–1.0)
Monocytes Relative: 10 %
Neutro Abs: 4 10*3/uL (ref 1.7–7.7)
Neutrophils Relative %: 49 %
Platelets: 172 10*3/uL (ref 150–400)
RBC: 4.31 MIL/uL (ref 3.87–5.11)
RDW: 13.9 % (ref 11.5–15.5)
WBC: 8.1 10*3/uL (ref 4.0–10.5)
nRBC: 0 % (ref 0.0–0.2)

## 2023-09-22 LAB — TYPE AND SCREEN
ABO/RH(D): O POS
Antibody Screen: NEGATIVE

## 2023-09-22 NOTE — Progress Notes (Signed)
For Anesthesia: PCP - Hillery Aldo, MD  Cardiologist - N/A  Bowel Prep reminder:  Chest x-ray -  EKG - 09/22/23 Stress Test -  ECHO -  Cardiac Cath -  Pacemaker/ICD device last checked: Pacemaker orders received: Device Rep notified:  Spinal Cord Stimulator:N/A  Sleep Study - N/A CPAP -   Fasting Blood Sugar - N/A Checks Blood Sugar _____ times a day Date and result of last Hgb A1c-  Last dose of GLP1 agonist- N/A GLP1 instructions:   Last dose of SGLT-2 inhibitors- N/A SGLT-2 instructions:   Blood Thinner Instructions:N/A Aspirin Instructions: Last Dose:  Activity level: Can go up a flight of stairs and activities of daily living without stopping and without chest pain and/or shortness of breath   Able to exercise without chest pain and/or shortness of breath  Anesthesia review: Hx: HTN,smoker.  Patient denies shortness of breath, fever, cough and chest pain at PAT appointment   Patient verbalized understanding of instructions that were given to them at the PAT appointment. Patient was also instructed that they will need to review over the PAT instructions again at home before surgery.

## 2023-09-23 ENCOUNTER — Telehealth: Payer: Self-pay | Admitting: Physician Assistant

## 2023-10-08 NOTE — Anesthesia Preprocedure Evaluation (Addendum)
Anesthesia Evaluation  Patient identified by MRN, date of birth, ID band Patient awake    Reviewed: Allergy & Precautions, H&P , NPO status , Patient's Chart, lab work & pertinent test results  History of Anesthesia Complications (+) history of anesthetic complications  Airway Mallampati: II  TM Distance: >3 FB Neck ROM: Full    Dental  (+) Edentulous Upper, Edentulous Lower, Dental Advisory Given   Pulmonary pneumonia, resolved, Current Smoker and Patient abstained from smoking., former smoker   Pulmonary exam normal breath sounds clear to auscultation       Cardiovascular hypertension, Pt. on medications Normal cardiovascular exam Rhythm:Regular Rate:Normal     Neuro/Psych  PSYCHIATRIC DISORDERS Anxiety Depression     Neuromuscular disease    GI/Hepatic Neg liver ROS, PUD,GERD  Medicated,,  Endo/Other  negative endocrine ROS    Renal/GU negative Renal ROS     Musculoskeletal  (+) Arthritis ,    Abdominal  (+) - obese  Peds  Hematology negative hematology ROS (+)   Anesthesia Other Findings   Reproductive/Obstetrics                             Anesthesia Physical Anesthesia Plan  ASA: 3  Anesthesia Plan: General   Post-op Pain Management: Tylenol PO (pre-op)*   Induction: Intravenous, Rapid sequence and Cricoid pressure planned  PONV Risk Score and Plan: 4 or greater and Treatment may vary due to age or medical condition, Ondansetron and Dexamethasone  Airway Management Planned: Oral ETT  Additional Equipment: None  Intra-op Plan:   Post-operative Plan: Extubation in OR  Informed Consent: I have reviewed the patients History and Physical, chart, labs and discussed the procedure including the risks, benefits and alternatives for the proposed anesthesia with the patient or authorized representative who has indicated his/her understanding and acceptance.     Dental advisory  given  Plan Discussed with: CRNA  Anesthesia Plan Comments:         Anesthesia Quick Evaluation

## 2023-10-09 ENCOUNTER — Encounter (HOSPITAL_COMMUNITY)
Admission: RE | Disposition: A | Discharge status: Home / Self Care | Payer: Self-pay | Source: Ambulatory Visit | Attending: Surgery

## 2023-10-09 ENCOUNTER — Other Ambulatory Visit: Payer: Self-pay

## 2023-10-09 ENCOUNTER — Inpatient Hospital Stay (HOSPITAL_COMMUNITY): Payer: Medicare HMO | Admitting: Anesthesiology

## 2023-10-09 ENCOUNTER — Inpatient Hospital Stay (HOSPITAL_COMMUNITY)
Admission: RE | Admit: 2023-10-09 | Discharge: 2023-10-12 | DRG: 331 | Disposition: A | Discharge status: Home / Self Care | Payer: Medicare HMO | Source: Ambulatory Visit | Attending: Surgery | Admitting: Surgery

## 2023-10-09 ENCOUNTER — Encounter (HOSPITAL_COMMUNITY): Payer: Self-pay | Admitting: Surgery

## 2023-10-09 ENCOUNTER — Other Ambulatory Visit (HOSPITAL_COMMUNITY): Payer: Self-pay

## 2023-10-09 DIAGNOSIS — K623 Rectal prolapse: Secondary | ICD-10-CM | POA: Diagnosis present

## 2023-10-09 DIAGNOSIS — Z803 Family history of malignant neoplasm of breast: Secondary | ICD-10-CM

## 2023-10-09 DIAGNOSIS — Z8261 Family history of arthritis: Secondary | ICD-10-CM | POA: Diagnosis not present

## 2023-10-09 DIAGNOSIS — Z825 Family history of asthma and other chronic lower respiratory diseases: Secondary | ICD-10-CM | POA: Diagnosis not present

## 2023-10-09 DIAGNOSIS — Z881 Allergy status to other antibiotic agents status: Secondary | ICD-10-CM

## 2023-10-09 DIAGNOSIS — Z823 Family history of stroke: Secondary | ICD-10-CM | POA: Diagnosis not present

## 2023-10-09 DIAGNOSIS — F32A Depression, unspecified: Secondary | ICD-10-CM | POA: Diagnosis present

## 2023-10-09 DIAGNOSIS — Z79899 Other long term (current) drug therapy: Secondary | ICD-10-CM

## 2023-10-09 DIAGNOSIS — E785 Hyperlipidemia, unspecified: Secondary | ICD-10-CM | POA: Diagnosis present

## 2023-10-09 DIAGNOSIS — Z818 Family history of other mental and behavioral disorders: Secondary | ICD-10-CM | POA: Diagnosis not present

## 2023-10-09 DIAGNOSIS — N736 Female pelvic peritoneal adhesions (postinfective): Secondary | ICD-10-CM | POA: Diagnosis present

## 2023-10-09 DIAGNOSIS — F17210 Nicotine dependence, cigarettes, uncomplicated: Secondary | ICD-10-CM | POA: Diagnosis present

## 2023-10-09 DIAGNOSIS — F419 Anxiety disorder, unspecified: Secondary | ICD-10-CM | POA: Diagnosis present

## 2023-10-09 DIAGNOSIS — Z882 Allergy status to sulfonamides status: Secondary | ICD-10-CM | POA: Diagnosis not present

## 2023-10-09 DIAGNOSIS — K219 Gastro-esophageal reflux disease without esophagitis: Secondary | ICD-10-CM | POA: Diagnosis present

## 2023-10-09 DIAGNOSIS — Z79818 Long term (current) use of other agents affecting estrogen receptors and estrogen levels: Secondary | ICD-10-CM

## 2023-10-09 DIAGNOSIS — Z8711 Personal history of peptic ulcer disease: Secondary | ICD-10-CM

## 2023-10-09 DIAGNOSIS — Z833 Family history of diabetes mellitus: Secondary | ICD-10-CM | POA: Diagnosis not present

## 2023-10-09 DIAGNOSIS — I10 Essential (primary) hypertension: Secondary | ICD-10-CM | POA: Diagnosis present

## 2023-10-09 DIAGNOSIS — Z811 Family history of alcohol abuse and dependence: Secondary | ICD-10-CM | POA: Diagnosis not present

## 2023-10-09 DIAGNOSIS — R21 Rash and other nonspecific skin eruption: Secondary | ICD-10-CM | POA: Diagnosis present

## 2023-10-09 DIAGNOSIS — R7303 Prediabetes: Secondary | ICD-10-CM | POA: Diagnosis present

## 2023-10-09 DIAGNOSIS — Z888 Allergy status to other drugs, medicaments and biological substances status: Secondary | ICD-10-CM

## 2023-10-09 DIAGNOSIS — Z8249 Family history of ischemic heart disease and other diseases of the circulatory system: Secondary | ICD-10-CM

## 2023-10-09 HISTORY — PX: XI ROBOT ASSISTED RECTOPEXY: SHX6788

## 2023-10-09 LAB — TYPE AND SCREEN
ABO/RH(D): O POS
Antibody Screen: NEGATIVE

## 2023-10-09 SURGERY — RECTOPEXY, ROBOT-ASSISTED
Anesthesia: General

## 2023-10-09 MED ORDER — SUGAMMADEX SODIUM 200 MG/2ML IV SOLN
INTRAVENOUS | Status: DC | PRN
  Administered 2023-10-09: 100 mg via INTRAVENOUS

## 2023-10-09 MED ORDER — SUCCINYLCHOLINE CHLORIDE 200 MG/10ML IV SOSY
PREFILLED_SYRINGE | INTRAVENOUS | Status: AC
  Filled 2023-10-09: qty 10

## 2023-10-09 MED ORDER — IBUPROFEN 400 MG PO TABS: 600.0000 mg | ORAL_TABLET | Freq: Four times a day (QID) | ORAL | Status: DC | PRN

## 2023-10-09 MED ORDER — LIDOCAINE 2% (20 MG/ML) 5 ML SYRINGE
INTRAMUSCULAR | Status: DC | PRN
  Administered 2023-10-09: 50 mg via INTRAVENOUS

## 2023-10-09 MED ORDER — OXYBUTYNIN CHLORIDE ER 5 MG PO TB24
15.0000 mg | ORAL_TABLET | Freq: Every day | ORAL | Status: DC
  Administered 2023-10-09 – 2023-10-11 (×3): 15 mg via ORAL
  Filled 2023-10-09 (×3): qty 3

## 2023-10-09 MED ORDER — LIDOCAINE HCL (PF) 2 % IJ SOLN
INTRAMUSCULAR | Status: AC
  Filled 2023-10-09: qty 5

## 2023-10-09 MED ORDER — LATANOPROST 0.005 % OP SOLN
1.0000 [drp] | Freq: Every day | OPHTHALMIC | Status: DC
  Administered 2023-10-09 – 2023-10-11 (×3): 1 [drp] via OPHTHALMIC
  Filled 2023-10-09: qty 2.5

## 2023-10-09 MED ORDER — PHENYLEPHRINE 80 MCG/ML (10ML) SYRINGE FOR IV PUSH (FOR BLOOD PRESSURE SUPPORT)
PREFILLED_SYRINGE | INTRAVENOUS | Status: AC
  Filled 2023-10-09: qty 10

## 2023-10-09 MED ORDER — BUPIVACAINE LIPOSOME 1.3 % IJ SUSP: 20.0000 mL | Freq: Once | INTRAMUSCULAR | Status: DC

## 2023-10-09 MED ORDER — MIDAZOLAM HCL 2 MG/2ML IJ SOLN
INTRAMUSCULAR | Status: AC
Start: 2023-10-09 — End: ?
  Filled 2023-10-09: qty 2

## 2023-10-09 MED ORDER — HEPARIN SODIUM (PORCINE) 5000 UNIT/ML IJ SOLN
5000.0000 [IU] | Freq: Three times a day (TID) | INTRAMUSCULAR | Status: DC
  Administered 2023-10-09 – 2023-10-12 (×8): 5000 [IU] via SUBCUTANEOUS
  Filled 2023-10-09 (×8): qty 1

## 2023-10-09 MED ORDER — DIPHENHYDRAMINE HCL 50 MG/ML IJ SOLN
12.5000 mg | Freq: Four times a day (QID) | INTRAMUSCULAR | Status: DC | PRN
Start: 2023-10-09 — End: 2023-10-12
  Administered 2023-10-09 – 2023-10-11 (×4): 12.5 mg via INTRAVENOUS
  Filled 2023-10-09 (×4): qty 1

## 2023-10-09 MED ORDER — POLYETHYLENE GLYCOL 3350 17 G PO PACK: 17.0000 g | PACK | Freq: Every day | ORAL | 0 refills | Status: AC

## 2023-10-09 MED ORDER — CARMEX CLASSIC LIP BALM EX OINT
TOPICAL_OINTMENT | CUTANEOUS | Status: DC | PRN
  Filled 2023-10-09: qty 10

## 2023-10-09 MED ORDER — ACETAMINOPHEN 500 MG PO TABS
1000.0000 mg | ORAL_TABLET | Freq: Four times a day (QID) | ORAL | Status: DC
  Administered 2023-10-09 – 2023-10-12 (×10): 1000 mg via ORAL
  Filled 2023-10-09 (×12): qty 2

## 2023-10-09 MED ORDER — CHLORHEXIDINE GLUCONATE 0.12 % MT SOLN
15.0000 mL | Freq: Once | OROMUCOSAL | Status: AC
Start: 2023-10-09 — End: 2023-10-09
  Administered 2023-10-09: 15 mL via OROMUCOSAL

## 2023-10-09 MED ORDER — BUSPIRONE HCL 5 MG PO TABS
5.0000 mg | ORAL_TABLET | Freq: Two times a day (BID) | ORAL | Status: DC
  Administered 2023-10-09 – 2023-10-12 (×6): 5 mg via ORAL
  Filled 2023-10-09 (×6): qty 1

## 2023-10-09 MED ORDER — ROCURONIUM BROMIDE 10 MG/ML (PF) SYRINGE
PREFILLED_SYRINGE | INTRAVENOUS | Status: DC | PRN
  Administered 2023-10-09: 50 mg via INTRAVENOUS

## 2023-10-09 MED ORDER — ALUM & MAG HYDROXIDE-SIMETH 200-200-20 MG/5ML PO SUSP: 30.0000 mL | Freq: Four times a day (QID) | ORAL | Status: DC | PRN

## 2023-10-09 MED ORDER — ENSURE PRE-SURGERY PO LIQD
296.0000 mL | Freq: Once | ORAL | Status: DC
  Filled 2023-10-09: qty 296

## 2023-10-09 MED ORDER — FENTANYL CITRATE (PF) 250 MCG/5ML IJ SOLN
INTRAMUSCULAR | Status: AC
  Filled 2023-10-09: qty 5

## 2023-10-09 MED ORDER — CHLORHEXIDINE GLUCONATE CLOTH 2 % EX PADS: 6.0000 | MEDICATED_PAD | Freq: Once | CUTANEOUS | Status: DC

## 2023-10-09 MED ORDER — PROPOFOL 10 MG/ML IV BOLUS
INTRAVENOUS | Status: AC
  Filled 2023-10-09: qty 20

## 2023-10-09 MED ORDER — LOSARTAN POTASSIUM 50 MG PO TABS
100.0000 mg | ORAL_TABLET | Freq: Every day | ORAL | Status: DC
  Administered 2023-10-10 – 2023-10-12 (×3): 100 mg via ORAL
  Filled 2023-10-09 (×3): qty 2

## 2023-10-09 MED ORDER — GLYCOPYRROLATE 0.2 MG/ML IJ SOLN
INTRAMUSCULAR | Status: AC
  Filled 2023-10-09: qty 1

## 2023-10-09 MED ORDER — ENSURE SURGERY PO LIQD
237.0000 mL | Freq: Two times a day (BID) | ORAL | Status: DC
  Administered 2023-10-10 – 2023-10-11 (×2): 237 mL via ORAL

## 2023-10-09 MED ORDER — ACETAMINOPHEN 500 MG PO TABS: 1000.0000 mg | ORAL_TABLET | Freq: Once | ORAL | Status: AC

## 2023-10-09 MED ORDER — HYDRALAZINE HCL 20 MG/ML IJ SOLN
INTRAMUSCULAR | Status: DC | PRN
  Administered 2023-10-09: 4 mg via INTRAVENOUS

## 2023-10-09 MED ORDER — KCL IN DEXTROSE-NACL 20-5-0.45 MEQ/L-%-% IV SOLN
INTRAVENOUS | Status: AC
  Filled 2023-10-09 (×2): qty 1000

## 2023-10-09 MED ORDER — BUPIVACAINE-EPINEPHRINE (PF) 0.25% -1:200000 IJ SOLN
INTRAMUSCULAR | Status: DC | PRN
  Administered 2023-10-09: 50 mL

## 2023-10-09 MED ORDER — DROPERIDOL 2.5 MG/ML IJ SOLN: 0.6250 mg | Freq: Once | INTRAMUSCULAR | Status: DC | PRN

## 2023-10-09 MED ORDER — HYDRALAZINE HCL 20 MG/ML IJ SOLN
INTRAMUSCULAR | Status: AC
  Filled 2023-10-09: qty 1

## 2023-10-09 MED ORDER — POLYETHYLENE GLYCOL 3350 17 G PO PACK
17.0000 g | PACK | Freq: Every day | ORAL | Status: DC
  Administered 2023-10-10: 17 g via ORAL
  Filled 2023-10-09 (×2): qty 1

## 2023-10-09 MED ORDER — VENLAFAXINE HCL ER 37.5 MG PO CP24
75.0000 mg | ORAL_CAPSULE | Freq: Every day | ORAL | Status: DC
  Administered 2023-10-10 – 2023-10-12 (×3): 75 mg via ORAL
  Filled 2023-10-09 (×3): qty 2

## 2023-10-09 MED ORDER — SIMETHICONE 80 MG PO CHEW
40.0000 mg | CHEWABLE_TABLET | Freq: Four times a day (QID) | ORAL | Status: DC | PRN
Start: 2023-10-09 — End: 2023-10-12

## 2023-10-09 MED ORDER — PROPOFOL 10 MG/ML IV BOLUS
INTRAVENOUS | Status: DC | PRN
  Administered 2023-10-09: 90 mg via INTRAVENOUS

## 2023-10-09 MED ORDER — PANTOPRAZOLE SODIUM 40 MG PO TBEC
40.0000 mg | DELAYED_RELEASE_TABLET | Freq: Every day | ORAL | Status: DC
  Administered 2023-10-10 – 2023-10-12 (×3): 40 mg via ORAL
  Filled 2023-10-09 (×3): qty 1

## 2023-10-09 MED ORDER — BUPIVACAINE-EPINEPHRINE 0.25% -1:200000 IJ SOLN
INTRAMUSCULAR | Status: AC
Start: 2023-10-09 — End: ?
  Filled 2023-10-09: qty 1

## 2023-10-09 MED ORDER — TRAMADOL HCL 50 MG PO TABS
50.0000 mg | ORAL_TABLET | Freq: Four times a day (QID) | ORAL | Status: DC | PRN
  Administered 2023-10-09 – 2023-10-10 (×2): 50 mg via ORAL
  Filled 2023-10-09 (×3): qty 1

## 2023-10-09 MED ORDER — FENTANYL CITRATE (PF) 250 MCG/5ML IJ SOLN
INTRAMUSCULAR | Status: DC | PRN
  Administered 2023-10-09: 50 ug via INTRAVENOUS
  Administered 2023-10-09: 100 ug via INTRAVENOUS
  Administered 2023-10-09 (×2): 50 ug via INTRAVENOUS

## 2023-10-09 MED ORDER — BUPIVACAINE LIPOSOME 1.3 % IJ SUSP
INTRAMUSCULAR | Status: AC
  Filled 2023-10-09: qty 20

## 2023-10-09 MED ORDER — ONDANSETRON HCL 4 MG/2ML IJ SOLN: 4.0000 mg | Freq: Four times a day (QID) | INTRAMUSCULAR | Status: DC | PRN

## 2023-10-09 MED ORDER — DIPHENHYDRAMINE HCL 12.5 MG/5ML PO ELIX
12.5000 mg | ORAL_SOLUTION | Freq: Four times a day (QID) | ORAL | Status: DC | PRN
Start: 2023-10-09 — End: 2023-10-12
  Administered 2023-10-10 – 2023-10-12 (×4): 12.5 mg via ORAL
  Filled 2023-10-09 (×4): qty 5

## 2023-10-09 MED ORDER — LACTATED RINGERS IV SOLN: INTRAVENOUS | Status: DC

## 2023-10-09 MED ORDER — OXYCODONE HCL 5 MG PO TABS
5.0000 mg | ORAL_TABLET | Freq: Three times a day (TID) | ORAL | 0 refills | Status: DC | PRN
  Filled 2023-10-09: qty 15, 5d supply, fill #0

## 2023-10-09 MED ORDER — ROCURONIUM BROMIDE 10 MG/ML (PF) SYRINGE
PREFILLED_SYRINGE | INTRAVENOUS | Status: AC
  Filled 2023-10-09: qty 10

## 2023-10-09 MED ORDER — ONDANSETRON HCL 4 MG PO TABS: 4.0000 mg | ORAL_TABLET | Freq: Four times a day (QID) | ORAL | Status: DC | PRN

## 2023-10-09 MED ORDER — HYDROMORPHONE HCL 1 MG/ML IJ SOLN: 0.2500 mg | INTRAMUSCULAR | Status: DC | PRN

## 2023-10-09 MED ORDER — MEPERIDINE HCL 50 MG/ML IJ SOLN: 6.2500 mg | INTRAMUSCULAR | Status: DC | PRN

## 2023-10-09 MED ORDER — 0.9 % SODIUM CHLORIDE (POUR BTL) OPTIME
TOPICAL | Status: DC | PRN
  Administered 2023-10-09: 1000 mL

## 2023-10-09 MED ORDER — ORAL CARE MOUTH RINSE: 15.0000 mL | Freq: Once | OROMUCOSAL | Status: AC

## 2023-10-09 MED ORDER — ENSURE PRE-SURGERY PO LIQD
592.0000 mL | Freq: Once | ORAL | Status: DC
  Filled 2023-10-09: qty 592

## 2023-10-09 MED ORDER — HYDROMORPHONE HCL 1 MG/ML IJ SOLN
0.5000 mg | INTRAMUSCULAR | Status: DC | PRN
  Administered 2023-10-09 – 2023-10-11 (×5): 0.5 mg via INTRAVENOUS
  Filled 2023-10-09 (×6): qty 0.5

## 2023-10-09 MED ORDER — DEXAMETHASONE SODIUM PHOSPHATE 10 MG/ML IJ SOLN
INTRAMUSCULAR | Status: DC | PRN
  Administered 2023-10-09: 5 mg via INTRAVENOUS

## 2023-10-09 MED ORDER — HEPARIN SODIUM (PORCINE) 5000 UNIT/ML IJ SOLN
5000.0000 [IU] | Freq: Once | INTRAMUSCULAR | Status: AC
  Administered 2023-10-09: 5000 [IU] via SUBCUTANEOUS
  Filled 2023-10-09: qty 1

## 2023-10-09 MED ORDER — DEXAMETHASONE SODIUM PHOSPHATE 10 MG/ML IJ SOLN
INTRAMUSCULAR | Status: AC
  Filled 2023-10-09: qty 1

## 2023-10-09 MED ORDER — PHENYLEPHRINE 80 MCG/ML (10ML) SYRINGE FOR IV PUSH (FOR BLOOD PRESSURE SUPPORT)
PREFILLED_SYRINGE | INTRAVENOUS | Status: DC | PRN
  Administered 2023-10-09: 160 ug via INTRAVENOUS

## 2023-10-09 MED ORDER — SODIUM CHLORIDE 0.9 % IV SOLN
2.0000 g | INTRAVENOUS | Status: AC
  Administered 2023-10-09: 2 g via INTRAVENOUS
  Filled 2023-10-09: qty 2

## 2023-10-09 MED ORDER — ACETAMINOPHEN 500 MG PO TABS
1000.0000 mg | ORAL_TABLET | ORAL | Status: AC
  Administered 2023-10-09: 1000 mg via ORAL
  Filled 2023-10-09: qty 2

## 2023-10-09 MED ORDER — LABETALOL HCL 5 MG/ML IV SOLN
INTRAVENOUS | Status: DC | PRN
  Administered 2023-10-09 (×2): 5 mg via INTRAVENOUS

## 2023-10-09 MED ORDER — ONDANSETRON HCL 4 MG/2ML IJ SOLN
INTRAMUSCULAR | Status: DC | PRN
  Administered 2023-10-09: 4 mg via INTRAVENOUS

## 2023-10-09 MED ORDER — ONDANSETRON HCL 4 MG/2ML IJ SOLN
INTRAMUSCULAR | Status: AC
  Filled 2023-10-09: qty 2

## 2023-10-09 MED ORDER — HYDRALAZINE HCL 20 MG/ML IJ SOLN: 10.0000 mg | INTRAMUSCULAR | Status: DC | PRN

## 2023-10-09 MED ORDER — ALVIMOPAN 12 MG PO CAPS
12.0000 mg | ORAL_CAPSULE | ORAL | Status: AC
  Administered 2023-10-09: 12 mg via ORAL
  Filled 2023-10-09: qty 1

## 2023-10-09 MED ORDER — SUCCINYLCHOLINE CHLORIDE 200 MG/10ML IV SOSY
PREFILLED_SYRINGE | INTRAVENOUS | Status: DC | PRN
  Administered 2023-10-09: 80 mg via INTRAVENOUS

## 2023-10-09 MED ORDER — MIDAZOLAM HCL 2 MG/2ML IJ SOLN
INTRAMUSCULAR | Status: DC | PRN
  Administered 2023-10-09: 2 mg via INTRAVENOUS

## 2023-10-09 MED ORDER — ATORVASTATIN CALCIUM 20 MG PO TABS
20.0000 mg | ORAL_TABLET | Freq: Every day | ORAL | Status: DC
  Administered 2023-10-09 – 2023-10-11 (×3): 20 mg via ORAL
  Filled 2023-10-09 (×3): qty 1

## 2023-10-09 SURGICAL SUPPLY — 56 items
ANTIFOG SOL W/FOAM PAD STRL (MISCELLANEOUS) ×1
BAG COUNTER SPONGE SURGICOUNT (BAG) ×1 IMPLANT
BLADE SURG SZ11 CARB STEEL (BLADE) ×1 IMPLANT
COVER BACK TABLE 60X90IN (DRAPES) ×1 IMPLANT
COVER MAYO STAND STRL (DRAPES) ×1 IMPLANT
COVER SURGICAL LIGHT HANDLE (MISCELLANEOUS) ×1 IMPLANT
COVER TIP SHEARS 8 DVNC (MISCELLANEOUS) ×1 IMPLANT
DEFOGGER SCOPE WARMER CLEARIFY (MISCELLANEOUS) ×1 IMPLANT
DERMABOND ADVANCED .7 DNX12 (GAUZE/BANDAGES/DRESSINGS) ×2 IMPLANT
DRAPE ARM DVNC X/XI (DISPOSABLE) ×4 IMPLANT
DRAPE COLUMN DVNC XI (DISPOSABLE) ×1 IMPLANT
DRAPE SHEET LG 3/4 BI-LAMINATE (DRAPES) ×1 IMPLANT
DRAPE SURG IRRIG POUCH 19X23 (DRAPES) ×1 IMPLANT
DRAPE UTILITY XL STRL (DRAPES) ×2 IMPLANT
DRIVER NDL LRG 8 DVNC XI (INSTRUMENTS) ×2 IMPLANT
DRIVER NDLE LRG 8 DVNC XI (INSTRUMENTS) ×2
ELECT PENCIL ROCKER SW 15FT (MISCELLANEOUS) ×1 IMPLANT
ELECT REM PT RETURN 15FT ADLT (MISCELLANEOUS) ×1 IMPLANT
GLOVE BIO SURGEON STRL SZ7.5 (GLOVE) ×2 IMPLANT
GOWN STRL REUS W/ TWL XL LVL3 (GOWN DISPOSABLE) ×2 IMPLANT
GRASPER TIP-UP FEN DVNC XI (INSTRUMENTS) ×1 IMPLANT
HOLDER FOLEY CATH W/STRAP (MISCELLANEOUS) ×1 IMPLANT
IRRIG SUCT STRYKERFLOW 2 WTIP (MISCELLANEOUS) ×1
IRRIGATION SUCT STRKRFLW 2 WTP (MISCELLANEOUS) ×1 IMPLANT
KIT BASIN OR (CUSTOM PROCEDURE TRAY) ×1 IMPLANT
KIT PROCEDURE DVNC SI (MISCELLANEOUS) IMPLANT
KIT TURNOVER KIT A (KITS) IMPLANT
LEGGING LITHOTOMY PAIR STRL (DRAPES) ×1 IMPLANT
NDL HYPO 22X1.5 SAFETY MO (MISCELLANEOUS) ×1 IMPLANT
NDL INSUFFLATION 14GA 120MM (NEEDLE) ×1 IMPLANT
NEEDLE HYPO 22X1.5 SAFETY MO (MISCELLANEOUS) ×1
NEEDLE INSUFFLATION 14GA 120MM (NEEDLE) ×1
PACK CARDIOVASCULAR III (CUSTOM PROCEDURE TRAY) ×1 IMPLANT
PAD POSITIONING PINK XL (MISCELLANEOUS) ×1 IMPLANT
PROTECTOR NERVE ULNAR (MISCELLANEOUS) ×2 IMPLANT
SCISSORS LAP 5X45 EPIX DISP (ENDOMECHANICALS) ×1 IMPLANT
SCISSORS MNPLR CVD DVNC XI (INSTRUMENTS) ×1 IMPLANT
SEAL UNIV 5-12 XI (MISCELLANEOUS) ×3 IMPLANT
SEALER VESSEL EXT DVNC XI (MISCELLANEOUS) ×1 IMPLANT
SLEEVE ADV FIXATION 5X100MM (TROCAR) IMPLANT
SOL ELECTROSURG ANTI STICK (MISCELLANEOUS) ×1
SOLUTION ANTFG W/FOAM PAD STRL (MISCELLANEOUS) ×1 IMPLANT
SOLUTION ELECTROSURG ANTI STCK (MISCELLANEOUS) ×1 IMPLANT
SPIKE FLUID TRANSFER (MISCELLANEOUS) ×1 IMPLANT
SUT ETHIBOND 0 36 GRN (SUTURE) IMPLANT
SUT MNCRL AB 4-0 PS2 18 (SUTURE) IMPLANT
SUT VLOC 180 2-0 6IN GS21 (SUTURE) IMPLANT
SUT VLOC 180 2-0 9IN GS21 (SUTURE) IMPLANT
SYR 20ML LL LF (SYRINGE) ×1 IMPLANT
SYR CONTROL 10ML LL (SYRINGE) ×1 IMPLANT
TOWEL OR 17X26 10 PK STRL BLUE (TOWEL DISPOSABLE) ×1 IMPLANT
TRAY FOLEY MTR SLVR 14FR STAT (SET/KITS/TRAYS/PACK) ×1 IMPLANT
TRAY FOLEY MTR SLVR 16FR STAT (SET/KITS/TRAYS/PACK) ×1 IMPLANT
TROCAR ADV FIXATION 5X100MM (TROCAR) ×1 IMPLANT
TUBING CONNECTING 10 (TUBING) ×1 IMPLANT
TUBING INSUFFLATION 10FT LAP (TUBING) ×1 IMPLANT

## 2023-10-09 NOTE — Op Note (Signed)
PATIENT: Cheryl [name]  77 y.o. female  Patient Care Team: Hillery Aldo, MD as PCP - General (Family Medicine)  PREOP DIAGNOSIS: RECTAL PROLAPSE FULL THICKNESS  POSTOP DIAGNOSIS: RECTAL PROLAPSE FULL THICKNESS  PROCEDURE:  Robotic assisted suture rectopexy Robotic lysis of adhesions x 30 minutes Bilateral transversus abdominus plane (TAP) blocks  SURGEON: Stephanie Coup. Ryian Lynde, MD  ASSISTANT: Karie Soda, MD  ANESTHESIA: General endotracheal  EBL: 20 mL Total I/O In: 1000 [I.V.:900; IV Piggyback:100] Out: 320 [Urine:300; Blood:20]  DRAINS: None  SPECIMEN: None  COUNTS: Sponge, needle and instrument counts were reported correct x2  FINDINGS: At least 6 to 8 cm of full-thickness prolapse is readily evident after induction of anesthesia.  This is able to be manually reduced and is clearly healthy/viable in appearance.  Adhesions from prior hysterectomy within the pelvis including multiple loops of bowel down around her vaginal cuff and left lower quadrant.  This adhesiolysis was necessary to facilitate the procedure.  Robotic assisted suture rectopexy carried out uneventfully.   NARRATIVE: Informed consent was verified. The patient was taken to the operating room, placed supine on the operating table and SCD's were applied. General endotracheal anesthesia was induced without difficulty.  She was then positioned in the lithotomy position with Allen stirrups.  Pressure points were evaluated and padded.  A foley catheter was then placed by nursing under sterile conditions. Hair on the abdomen was clipped.  She was secured to the operating table. The abdomen was then prepped and draped in the standard sterile fashion. Surgical timeout was called indicating the correct patient, procedure, positioning and need for preoperative antibiotics.   An OG tube was placed by anesthesia and confirmed to be to suction.  At Palmer's point, a stab incision was created and the Veress needle was  introduced into the peritoneal cavity on the first attempt.  Intraperitoneal location was confirmed by the aspiration and saline drop test.  Pneumoperitoneum was established to a maximum pressure of 15 mmHg using CO2.  Following this, the abdomen was marked for planned trocar sites.  Just to the right and cephalad to the umbilicus, an 8 mm incision was created and an 8 mm blunt tipped robotic trocar was cautiously placed into the peritoneal cavity.  The laparoscope was inserted and demonstrated no evidence of trocar site nor Veress needle site complications.  The Veress needle was removed.  Bilateral transversus abdominis plane blocks were then created using a dilute mixture of Exparel with Marcaine.  3 additional 8 mm robotic trochars were placed under direct visualization roughly in a line extending from the right ASIS towards the left upper quadrant. The bladder was inspected and noted to be at/below the pubic symphysis.  Staying 3 fingerbreadths above the pubic symphysis, an incision was created and the 12 mm robotic trocar inserted directed cephalad into the peritoneal cavity under direct visualization.  An additional 5 mm assist port was placed in the right lateral abdomen under direct visualization.  The abdomen was surveyed and there was bowel containing adhesions predominantly of sigmoid colon in her pelvis related to prior surgery.Marland Kitchen  She was positioned in Trendelenburg with the left side tilted slightly up.  Small bowel was carefully retracted out of the pelvis.  The robot was then docked and I went to the console.  We began with adhesiolysis. Adhesions consisting of primarily colon are carefully taken down from the pelvis.  She had adhesions to her vaginal cuff and additionally in her left lower quadrant.  All adhesiolysis was carried  out using sharp dissection with scissors.  The bowel was then carefully inspected and there are no serosal injuries identified.  We also did a bimanual exam and confirmed  that there was no injuries to the vaginal cuff.  Total time with adhesiolysis is approximately 30 minutes.  This is necessary to facilitate the suture rectopexy surgery.   The sigmoid colon was readily identified.  Attachments of the sigmoid colon were taken down from the intersigmoid fossa.  The rectosigmoid colon was grasped and elevated anteriorly.  Beginning with a medial to lateral approach, the peritoneum overlying the presacral space was carefully incised.  The TME plane was readily gained working in a plane between the fascia propria of the rectum and the presacral fascia.  Hypogastric nerves were seen going along the the presacral fascia and were protected free of injury.  Working more proximally, the mesorectum and sigmoid mesentery were carefully mobilized off of the peritoneum.  The left ureter was identified and protected free of injury.    We then continued our "TME" plane of dissection staying out of the mesorectum circumferentially.  We began with a posterior based approach first.  This was done all the way down to the level of pelvic floor.  We then commenced this dissection laterally maintaining our plane between the presacral fascia and fascia propria the rectum.  Our plane is then completed anteriorly spending time meticulously dissecting the rectovaginal septum staying clear of both the rectal serosa as well as the posterior wall of the vagina.  This was carried all the way down to the pelvic floor circumferentially.  The rectum is then placed on slight tension.  I then went below and performed a rectal exam.  She has no evident prolapse and has achieved a nice, taught rectopexy.  I then went back to the surgeon console.  The rectum has a healthy appearance.  We have preserved the blood supply to the rectum.  Two, 0 Ethibond sutures are placed into the abdominal cavity under direct visualization.  The rectum is then pexied with 2 sutures on either side tacking the midportion of the  rectum to the sacral promontory.  At this location we are essentially bringing the level of the peritoneal reflection all the way up to her promontory.  This is where the pexy achieves a nice taut result.  The area was then inspected.  Hemostasis is noted to be achieved.  The peritoneum on both sides of the rectum is then closed to prevent bowel from sliding into her deep pelvis and this is done using 2-0 V-Loc sutures bilaterally, taking care to steer clear both the left and right ureters as well as the underlying iliac vasculature.  All suture needles are removed under direct visualization and accounted for.  I reexamined the perineum.  There is no evident defects to the vaginal wall or to the anterior wall of the rectum.  The rectal prolapse has been completely addressed with a nice talk lay both within the pelvis and externally on physical exam.  The pelvis is then irrigated.  Hemostasis is noted to be achieved.  The abdomen and pelvis are surveyed and noted to be completely hemostatic without any apparent injury.  Under direct visualization, all trochars are removed.    Sponge, needle, and instrument counts were reported correct x2. 4-0 Monocryl subcuticular suture was used to close the skin of all incision sites.  Dermabond was placed over all incisions.  A honeycomb dressing placed over the Pfannenstiel as well.  She was then taken out of lithotomy, awakened from anesthesia, extubated, and transferred to a stretcher for transport to PACU in satisfactory condition having tolerated the procedure well.

## 2023-10-09 NOTE — Transfer of Care (Signed)
Immediate Anesthesia Transfer of Care Note  Patient: Cheryl [name]  Procedure(s) Performed: XI ROBOT ASSISTED SUTURE RECTOPEXY  Patient Location: PACU  Anesthesia Type:General  Level of Consciousness: drowsy  Airway & Oxygen Therapy: Patient Spontanous Breathing and Patient connected to face mask oxygen  Post-op Assessment: Report given to RN and Post -op Vital signs reviewed and stable  Post vital signs: Reviewed and stable  Last Vitals:  Vitals Value Taken Time  BP 158/70 10/09/23 1035  Temp 36.5 C 10/09/23 1035  Pulse 59 10/09/23 1036  Resp 6 10/09/23 1036  SpO2 100 % 10/09/23 1036  Vitals shown include unfiled device data.  Last Pain:  Vitals:   10/09/23 0630  TempSrc:   PainSc: 0-No pain         Complications: No notable events documented.

## 2023-10-09 NOTE — Anesthesia Postprocedure Evaluation (Signed)
Anesthesia Post Note  Patient: Cheryl [name]  Procedure(s) Performed: XI ROBOT ASSISTED SUTURE RECTOPEXY     Patient location during evaluation: PACU Anesthesia Type: General Level of consciousness: sedated and patient cooperative Pain management: pain level controlled Vital Signs Assessment: post-procedure vital signs reviewed and stable Respiratory status: spontaneous breathing Cardiovascular status: stable Anesthetic complications: no   No notable events documented.  Last Vitals:  Vitals:   10/09/23 1359 10/09/23 1500  BP: (!) 152/80 (!) 144/63  Pulse: 78 69  Resp: 16 16  Temp: 37 C 36.9 C  SpO2: 98% 96%    Last Pain:  Vitals:   10/09/23 1551  TempSrc:   PainSc: 5                  Lewie Loron

## 2023-10-09 NOTE — Anesthesia Procedure Notes (Signed)
Procedure Name: Intubation Date/Time: 10/09/2023 8:01 AM  Performed by: Thomasene Ripple, CRNAPre-anesthesia Checklist: Patient identified, Emergency Drugs available, Suction available and Patient being monitored Patient Re-evaluated:Patient Re-evaluated prior to induction Oxygen Delivery Method: Circle System Utilized Preoxygenation: Pre-oxygenation with 100% oxygen Induction Type: IV induction, Cricoid Pressure applied and Rapid sequence Laryngoscope Size: Miller and 2 Grade View: Grade I Tube type: Oral Tube size: 7.0 mm Number of attempts: 1 Airway Equipment and Method: Stylet and Oral airway Placement Confirmation: ETT inserted through vocal cords under direct vision, positive ETCO2 and breath sounds checked- equal and bilateral Secured at: 20 cm Tube secured with: Tape Dental Injury: Teeth and Oropharynx as per pre-operative assessment

## 2023-10-09 NOTE — H&P (Signed)
CC: Here today for surgery  HPI: Cheryl [name] [name] is an 77 y.o. female with history of HTN, HLD, GERD, PUD, whom is seen in the office today as a referral by Dr. Maurine Minister for evaluation of possible hemorrhoids vs rectal prolapse  Colonoscopy 05/24/21 with Dr. Donnalee Curry - - A single 4 mm polyp in the rectum, descending, and hepatic flexure - A couple small polyps removed and transverse as well - Examined portion of ileum normal  Reports that beginning in the middle of September, 2024 began having some issues with sensation of a large balloon expanding from the anal area when on the commode. She will have a significant mucousy seepage and incontinence that has occurred since this developed. Denies any pain whatsoever with defecation. Reports that is prolapsing to she has a reduced when she is finished on the commode and goes and sits down. She has not attempted to manually reduce it herself. Denies any history of this ever getting "stuck" or unable to reduce with sitting.  Does report some degree of fecal incontinence in part related to this prolapse with primarily mucus type discharge but occasionally some stool. This is more pronounced if she is having diarrhea or liquid stool. She will wear diaper daily out of fear of accidents. She has significantly altered her schedule and activities around this concern as well.  Denies any sort of constipation at present or prior. Reports no history of laxative dependence and generally soft daily BMs for much of her adult life. Denies any history of pelvic floor trauma, episiotomy, or anorectal surgeries in the past.  PMH: HTN, HLD, GERD, PUD ( reports GI bleed from a 'gastric ulcer' in 2012 managed with EGD)  PSH: Laparoscopic hysterectomy, 1996 complicated by what she describes to be subcutaneous emphysema from the minimally invasive surgery insufflation. Reports that this resolved within 24 hours.  FHx: Maternal grandmother had breast cancer;  otherwise, denies any known family history of colorectal, breast, endometrial or ovarian cancer  Social Hx: Denies use of tobacco/EtOH/illicit drug. She is here today with her daughter. She does report that she is happily retired having worked at a [employer] for 34 years.   She denies any changes in health or health history since we met in the office. No new medications/allergies. She states she is ready for surgery today.  Past Medical History:  Diagnosis Date   Abdominal aneurysm (HCC)    Allergy    seasonal   Anxiety    Arthritis    hips   Cataract    Complication of anesthesia    facial swelling after hysterectomy   Depression    Gastric ulcer 10/27/2010   Hypertension    Hypokalemia    Hyponatremia    Lymphocytic colitis    Osteopenia    Peptic ulcer    Piriformis syndrome    Pneumonia    Pre-diabetes    Radiculopathy of lumbar region    Ulcerative colitis (HCC)    Vertigo    with sinus issues   Wears dentures    partial upper    Past Surgical History:  Procedure Laterality Date   ABDOMINAL HYSTERECTOMY     APPENDECTOMY     BACK SURGERY     BREAST BIOPSY Left 2012   negative   CATARACT EXTRACTION, BILATERAL Bilateral 2005   COLONOSCOPY  2012   COLONOSCOPY WITH PROPOFOL N/A 10/11/2015   Procedure: COLONOSCOPY WITH PROPOFOL;  Surgeon: Midge Minium, MD;  Location: [facility];  Service:  Endoscopy;  Laterality: N/A;   COLONOSCOPY WITH PROPOFOL N/A 05/24/2021   Procedure: COLONOSCOPY WITH PROPOFOL;  Surgeon: Earline Mayotte, MD;  Location: [facility] END[facility];  Service: Gastroenterology;  Laterality: N/A;   ESOPHAGOGASTRODUODENOSCOPY N/A 05/24/2021   Procedure: ESOPHAGOGASTRODUODENOSCOPY (EGD);  Surgeon: Earline Mayotte, MD;  Location: [facility];  Service: Gastroenterology;  Laterality: N/A;   POLYPECTOMY  10/11/2015   Procedure: POLYPECTOMY;  Surgeon: Midge Minium, MD;  Location: [facility];  Service: Endoscopy;;    Family  History  Problem Relation Age of Onset   Arthritis Mother    Diabetes Mother    Depression Mother    COPD Mother    Heart disease Mother    Hypertension Mother    Alcohol abuse Father    Stroke Father    Stroke Paternal Aunt    Breast cancer Maternal Grandmother     Social:  reports that she has been smoking cigarettes. She has never used smokeless tobacco. She reports that she does not currently use alcohol after a past usage of about 3.0 standard drinks of alcohol per week. She reports that she does not use drugs.  Allergies:  Allergies  Allergen Reactions   Gramineae Pollens Other (See Comments)   Doxycycline Nausea Only and Palpitations    Shaky   Sulfa Antibiotics Nausea Only    Shaking     Medications: I have reviewed the patient's current medications.  No results found for this or any previous visit (from the past 48 hours).  No results found.   PE Blood pressure (!) 174/79, pulse 72, temperature 98.4 F (36.9 C), temperature source Oral, resp. rate 16, height 5' (1.524 m), weight 50 kg, SpO2 100%. Constitutional: NAD; conversant Eyes: Moist conjunctiva; no lid lag; anicteric Lungs: Normal respiratory effort CV: RRR GI: Abd soft, NT/ND Psychiatric: Appropriate affect  No results found for this or any previous visit (from the past 48 hours).  No results found.  A/P: Cheryl [name] [name] is an 77 y.o. female with hx of HTN, HLD, GERD, PUD here for evaluation of full-thickness rectal prolapse  -The anatomy and physiology of the GI tract was reviewed with the patient. The pathophysiology of full-thickness rectal prolapse was discussed as well with associated pictures. -We have discussed various different treatment options going forward including surgery (the most definitive) to address this -robotic assisted suture rectopexy; possible flexible sigmoidoscopy -The planned procedure, material risks (including, but not limited to, pain, bleeding, infection, scarring,  need for blood transfusion, damage to surrounding structures- blood vessels/nerves/viscus/organs, damage to ureter, urine leak, leak from anastomosis, need for additional procedures, low anterior resection syndrome (LARS) if resection were necessary = increased fecal urgency and/or frequency, scenarios where a stoma may be necessary and where it may be permanent, worsening of pre-existing medical conditions, chronic diarrhea, constipation secondary to narcotic use, hernia, recurrence of prolapse in 10-15% of cases, pneumonia, heart attack, stroke, death) benefits and alternatives to surgery were discussed at length. The patient's questions were answered to her and her daughter's satisfaction, they voiced understanding and elected to proceed with surgery. Additionally, we discussed typical postoperative expectations and the recovery process. -Much of her incontinence is likely related to the chronic prolapse. We did discuss that with surgery, this may actually worsen but typically over the course of weeks to months generally improves. We did discuss the potential role for pelvic floor physical therapy following surgery if this is an ongoing issue.   Marin Olp, MD [facility]

## 2023-10-09 NOTE — Discharge Instructions (Signed)
POST OP INSTRUCTIONS AFTER COLON SURGERY  DIET: Be sure to include lots of fluids daily to stay hydrated - 64oz of water per day (8, 8 oz glasses).  Avoid fast food or heavy meals for the first couple of weeks as your are more likely to get nauseated. Avoid raw/uncooked fruits or vegetables for the first 4 weeks (its ok to have these if they are blended into smoothie form). If you have fruits/vegetables, make sure they are cooked until soft enough to mash on the roof of your mouth and chew your food well. Otherwise, diet as tolerated.  Take your usually prescribed home medications unless otherwise directed.  PAIN CONTROL: Pain is best controlled by a usual combination of three different methods TOGETHER: Ice/Heat Over the counter pain medication Prescription pain medication Most patients will experience some swelling and bruising around the surgical site.  Ice packs or heating pads (30-60 minutes up to 6 times a day) will help. Some people prefer to use ice alone, heat alone, alternating between ice & heat.  Experiment to what works for you.  Swelling and bruising can take several weeks to resolve.   It is helpful to take an over-the-counter pain medication regularly for the first few weeks: Ibuprofen (Motrin/Advil) - 200mg  tabs - take 3 tabs (600mg ) every 6 hours as needed for pain (unless you have been directed previously to avoid NSAIDs/ibuprofen) Acetaminophen (Tylenol) - you may take 650mg  every 6 hours as needed. You can take this with motrin as they act differently on the body. If you are taking a narcotic pain medication that has acetaminophen in it, do not take over the counter tylenol at the same time. NOTE: You may take both of these medications together - most patients  find it most helpful when alternating between the two (i.e. Ibuprofen at 6am, tylenol at 9am, ibuprofen at 12pm ..Marland Kitchen) A  prescription for pain medication should be given to you upon discharge.  Take your pain medication as  prescribed if your pain is not adequatly controlled with the over-the-counter pain reliefs mentioned above.  Avoid getting constipated.  For at least the next 8 weeks, you should be taking a daily dose of MiraLAX-1 cap of MiraLAX daily.  If any constipation, you may increase this to 2 capfuls of MiraLAX per day.  Dressing: Your incisions are covered in Dermabond which is like sterile superglue for the skin. This will come off on it's own in a couple weeks. It is waterproof and you may bathe normally starting the day after your surgery in a shower. Avoid baths/pools/lakes/oceans until your wounds have fully healed.  ACTIVITIES as tolerated:   Avoid heavy lifting (>10lbs or 1 gallon of milk) for the next 6 weeks. You may resume regular daily activities as tolerated--such as daily self-care, walking, climbing stairs--gradually increasing activities as tolerated.  If you can walk 30 minutes without difficulty, it is safe to try more intense activity such as jogging, treadmill, bicycling, low-impact aerobics.  DO NOT PUSH THROUGH PAIN.  Let pain be your guide: If it hurts to do something, don't do it. You may drive when you are no longer taking prescription pain medication, you can comfortably wear a seatbelt, and you can safely maneuver your car and apply brakes.  FOLLOW UP in our office Please call [facility] at [phone] to set up an appointment to see your surgeon in the office for a follow-up appointment approximately 2 weeks after your surgery. Make sure that you call for this appointment  the day you arrive home to insure a convenient appointment time.  9. If you have disability or family leave forms that need to be completed, you may have them completed by your primary care physician's office; for return to work instructions, please ask our office staff and they will be happy to assist you in obtaining this documentation   When to call us [phone]: Poor pain control Reactions / problems  with new medications (rash/itching, etc)  Fever over 101.5 F (38.5 C) Inability to urinate Nausea/vomiting Worsening swelling or bruising Continued bleeding from incision. Increased pain, redness, or drainage from the incision  The clinic staff is available to answer your questions during regular business hours (8:30am-5pm).  Please don't hesitate to call and ask to speak to one of our nurses for clinical concerns.   A surgeon from [facility] is always on call at the hospitals   If you have a medical emergency, go to the nearest emergency room or call 911.  [facility] [address], [address], [place], [place]  [address] MAIN: [phone] FAX: [phone] [email or web address]

## 2023-10-10 ENCOUNTER — Encounter (HOSPITAL_COMMUNITY): Payer: Self-pay | Admitting: Surgery

## 2023-10-10 LAB — CBC
HCT: 33.7 % — ABNORMAL LOW (ref 36.0–46.0)
Hemoglobin: 11.5 g/dL — ABNORMAL LOW (ref 12.0–15.0)
MCH: 29.3 pg (ref 26.0–34.0)
MCHC: 34.1 g/dL (ref 30.0–36.0)
MCV: 85.8 fL (ref 80.0–100.0)
Platelets: 176 K/uL (ref 150–400)
RBC: 3.93 MIL/uL (ref 3.87–5.11)
RDW: 14.6 % (ref 11.5–15.5)
WBC: 10.5 K/uL (ref 4.0–10.5)
nRBC: 0 % (ref 0.0–0.2)

## 2023-10-10 LAB — BASIC METABOLIC PANEL WITH GFR
Anion gap: 7 (ref 5–15)
BUN: 7 mg/dL — ABNORMAL LOW (ref 8–23)
CO2: 25 mmol/L (ref 22–32)
Calcium: 7.9 mg/dL — ABNORMAL LOW (ref 8.9–10.3)
Chloride: 102 mmol/L (ref 98–111)
Creatinine, Ser: 0.6 mg/dL (ref 0.44–1.00)
GFR, Estimated: >60 mL/min
Glucose, Bld: 102 mg/dL — ABNORMAL HIGH (ref 70–99)
Potassium: 3.1 mmol/L — ABNORMAL LOW (ref 3.5–5.1)
Sodium: 134 mmol/L — ABNORMAL LOW (ref 135–145)

## 2023-10-10 MED ORDER — HYDROCORTISONE 1 % EX CREA
TOPICAL_CREAM | Freq: Two times a day (BID) | CUTANEOUS | Status: DC
  Administered 2023-10-10: 1 via TOPICAL
  Filled 2023-10-10: qty 28

## 2023-10-10 NOTE — Plan of Care (Signed)
  Problem: Education: Goal: Understanding of discharge needs will improve Outcome: Progressing   Problem: Education: Goal: Verbalization of understanding of the causes of altered bowel function will improve Outcome: Progressing   Problem: Activity: Goal: Ability to tolerate increased activity will improve Outcome: Progressing

## 2023-10-10 NOTE — Progress Notes (Signed)
1 Day Post-Op   Subjective/Chief Complaint: Complains of a rash on the right forearm   Objective: Vital signs in last 24 hours: Temp:  [97.5 F (36.4 C)-98.6 F (37 C)] 98 F (36.7 C) (12/14 1056) Pulse Rate:  [63-78] 73 (12/14 1056) Resp:  [16-18] 18 (12/14 1056) BP: (112-173)/(63-85) 173/75 (12/14 1056) SpO2:  [96 %-100 %] 100 % (12/14 1056) Weight:  [51 kg] 51 kg (12/14 0500) Last BM Date : 10/10/23  Intake/Output from previous day: 12/13 0701 - 12/14 0700 In: 2060 [P.O.:840; I.V.:1120; IV Piggyback:100] Out: 2020 [Urine:2000; Blood:20] Intake/Output this shift: Total I/O In: 240 [P.O.:240] Out: 300 [Urine:300]  General appearance: alert and cooperative Resp: clear to auscultation bilaterally Cardio: regular rate and rhythm GI: Soft with minimal tenderness  Lab Results:  Recent Labs    10/10/23 0522  WBC 10.5  HGB 11.5*  HCT 33.7*  PLT 176   BMET Recent Labs    10/10/23 0522  NA 134*  K 3.1*  CL 102  CO2 25  GLUCOSE 102*  BUN 7*  CREATININE 0.60  CALCIUM 7.9*   PT/INR No results for input(s): "LABPROT", "INR" in the last 72 hours. ABG No results for input(s): "PHART", "HCO3" in the last 72 hours.  Invalid input(s): "PCO2", "PO2"  Studies/Results: No results found.  Anti-infectives: Anti-infectives (From admission, onward)    Start     Dose/Rate Route Frequency Ordered Stop   10/09/23 0615  cefoTEtan (CEFOTAN) 2 g in sodium chloride 0.9 % 100 mL IVPB        2 g 200 mL/hr over 30 Minutes Intravenous On call to O.R. 10/09/23 0607 10/09/23 0807       Assessment/Plan: s/p Procedure(s): XI ROBOT ASSISTED SUTURE RECTOPEXY (N/A) Advance diet as tolerated Ambulate POD 1 Will give Benadryl and topical hydrocortisone for the rash  LOS: 1 day    Chevis Pretty III 10/10/2023

## 2023-10-11 LAB — BASIC METABOLIC PANEL
Anion gap: 5 (ref 5–15)
BUN: 11 mg/dL (ref 8–23)
CO2: 26 mmol/L (ref 22–32)
Calcium: 8.4 mg/dL — ABNORMAL LOW (ref 8.9–10.3)
Chloride: 103 mmol/L (ref 98–111)
Creatinine, Ser: 0.5 mg/dL (ref 0.44–1.00)
GFR, Estimated: >60 mL/min (ref >60)
Glucose, Bld: 87 mg/dL (ref 70–99)
Potassium: 3.7 mmol/L (ref 3.5–5.1)
Sodium: 134 mmol/L — ABNORMAL LOW (ref 135–145)

## 2023-10-11 LAB — CBC
HCT: 33.9 % — ABNORMAL LOW (ref 36.0–46.0)
Hemoglobin: 11.3 g/dL — ABNORMAL LOW (ref 12.0–15.0)
MCH: 29 pg (ref 26.0–34.0)
MCHC: 33.3 g/dL (ref 30.0–36.0)
MCV: 86.9 fL (ref 80.0–100.0)
Platelets: 181 10*3/uL (ref 150–400)
RBC: 3.9 MIL/uL (ref 3.87–5.11)
RDW: 14.7 % (ref 11.5–15.5)
WBC: 7.8 10*3/uL (ref 4.0–10.5)
nRBC: 0 % (ref 0.0–0.2)

## 2023-10-11 NOTE — Progress Notes (Signed)
2 Days Post-Op   Subjective/Chief Complaint: Complains of diarrhea. Rash right arm   Objective: Vital signs in last 24 hours: Temp:  [97.6 F (36.4 C)-98.4 F (36.9 C)] 98.1 F (36.7 C) (12/15 0638) Pulse Rate:  [67-78] 78 (12/15 0638) Resp:  [14-18] 14 (12/15 0638) BP: (125-177)/(71-90) 150/78 (12/15 0638) SpO2:  [98 %-100 %] 99 % (12/15 4098) Weight:  [50 kg] 50 kg (12/15 0500) Last BM Date : 10/10/23  Intake/Output from previous day: 12/14 0701 - 12/15 0700 In: 600 [P.O.:600] Out: 300 [Urine:300] Intake/Output this shift: No intake/output data recorded.  General appearance: alert and cooperative Resp: clear to auscultation bilaterally Cardio: regular rate and rhythm GI: soft, minimal tenderness. Incisions look good  Lab Results:  Recent Labs    10/10/23 0522 10/11/23 0459  WBC 10.5 7.8  HGB 11.5* 11.3*  HCT 33.7* 33.9*  PLT 176 181   BMET Recent Labs    10/10/23 0522 10/11/23 0459  NA 134* 134*  K 3.1* 3.7  CL 102 103  CO2 25 26  GLUCOSE 102* 87  BUN 7* 11  CREATININE 0.60 0.50  CALCIUM 7.9* 8.4*   PT/INR No results for input(s): "LABPROT", "INR" in the last 72 hours. ABG No results for input(s): "PHART", "HCO3" in the last 72 hours.  Invalid input(s): "PCO2", "PO2"  Studies/Results: No results found.  Anti-infectives: Anti-infectives (From admission, onward)    Start     Dose/Rate Route Frequency Ordered Stop   10/09/23 0615  cefoTEtan (CEFOTAN) 2 g in sodium chloride 0.9 % 100 mL IVPB        2 g 200 mL/hr over 30 Minutes Intravenous On call to O.R. 10/09/23 0607 10/09/23 0807       Assessment/Plan: s/p Procedure(s): XI ROBOT ASSISTED SUTURE RECTOPEXY (N/A) Regular diet Ambulate Would like to stay another day and see if bowel function becomes controllable POD 2  LOS: 2 days    Chevis Pretty III 10/11/2023

## 2023-10-12 LAB — CBC
HCT: 37.3 % (ref 36.0–46.0)
Hemoglobin: 12 g/dL (ref 12.0–15.0)
MCH: 28.4 pg (ref 26.0–34.0)
MCHC: 32.2 g/dL (ref 30.0–36.0)
MCV: 88.2 fL (ref 80.0–100.0)
Platelets: 201 10*3/uL (ref 150–400)
RBC: 4.23 MIL/uL (ref 3.87–5.11)
RDW: 14.6 % (ref 11.5–15.5)
WBC: 6.8 10*3/uL (ref 4.0–10.5)
nRBC: 0 % (ref 0.0–0.2)

## 2023-10-12 LAB — BASIC METABOLIC PANEL
Anion gap: 3 — ABNORMAL LOW (ref 5–15)
BUN: 10 mg/dL (ref 8–23)
CO2: 28 mmol/L (ref 22–32)
Calcium: 8.6 mg/dL — ABNORMAL LOW (ref 8.9–10.3)
Chloride: 104 mmol/L (ref 98–111)
Creatinine, Ser: 0.59 mg/dL (ref 0.44–1.00)
GFR, Estimated: >60 mL/min (ref >60)
Glucose, Bld: 88 mg/dL (ref 70–99)
Potassium: 4.9 mmol/L (ref 3.5–5.1)
Sodium: 135 mmol/L (ref 135–145)

## 2023-10-12 NOTE — Plan of Care (Signed)
  Problem: Education: Goal: Understanding of discharge needs will improve Outcome: Progressing   

## 2023-10-12 NOTE — Discharge Summary (Signed)
Patient ID: COSETTA QU MRN: [id number] DOB/AGE: 77/05/1946 77 y.o.  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 She began having spontaneous bowel function. Her diet as advanced which she tolerated. Initially had loose stool but this slowed down and her control improved. On 10/12/23 she is comfortable with and stable for discharge home. Postoperative expectations were reviewed, questions answered and follow-up arranged. She has been encouraged to call our office with any questions or concerns    Allergies as of 10/12/2023       Reactions   Gramineae Pollens Other (See Comments)   Doxycycline Nausea Only, Palpitations   Shaky   Sulfa Antibiotics Nausea Only   Shaking         Medication List     TAKE these medications    atorvastatin 20 MG tablet Commonly known as: LIPITOR Take 20 mg by mouth at bedtime.   busPIRone 5 MG tablet Commonly known as: BUSPAR Take 5 mg by mouth 2 (two) times daily.   cetirizine 10 MG tablet Commonly known as: ZYRTEC Take 1 tablet by mouth  twice daily What changed: when to take this   esterified estrogens 0.625 MG tablet Commonly known as: MENEST Take 0.625 mg by mouth daily.   GERITOL PO Take 1 tablet by mouth daily.   hydrocortisone cream 0.5 % Apply 1 Application topically 2 (two) times daily.   latanoprost 0.005 % ophthalmic solution Commonly known as: XALATAN Place 1 drop into both eyes at bedtime.   losartan 100 MG tablet Commonly known as: COZAAR Take 100 mg by mouth daily.   oxybutynin 15 MG 24 hr tablet Commonly known as: DITROPAN XL Take 15 mg by mouth at bedtime.   oxyCODONE 5 MG immediate release tablet Commonly known as: Roxicodone Take 1 tablet (5 mg total) by mouth every 8 (eight) hours as needed (postop pain not controlled with tylenol and ibuprofen first).   pantoprazole 40 MG tablet Commonly known as: PROTONIX Take 40 mg by mouth daily.   polyethylene glycol 17 g packet Commonly known as: MIRALAX / GLYCOLAX Take 17 g by mouth daily.   venlafaxine XR 75 MG 24 hr capsule Commonly known as: EFFEXOR-XR venlafaxine ER 75 mg capsule,extended release 24 hr   ZINC PO Take 1 tablet by mouth daily.          Follow-up Information     Andria Meuse, MD Follow up on 11/18/2023.   Specialties: General Surgery, Colon and Rectal Surgery Why: Please arrive by 8:45 am Contact information: [address] [place] [address] [place] [address] [phone]  Stephanie Coup. Cliffton Asters, M.D. [facility]

## 2023-10-12 NOTE — Progress Notes (Signed)
Patient discharged home, IV removed, discharge paperwork provided and explained, patient verbalized understanding, RN delivered prescribed Oxycodone at the bedside prior to discharge.

## 2023-10-12 NOTE — Progress Notes (Signed)
3 Days Post-Op   Subjective/Chief Complaint: Diarrhea resolved, thickened up. Better control now. No prolapse reported   Objective: Vital signs in last 24 hours: Temp:  [97.8 F (36.6 C)-98.6 F (37 C)] 98 F (36.7 C) (12/16 0505) Pulse Rate:  [65-79] 65 (12/16 0505) Resp:  [16-18] 16 (12/16 0505) BP: (133-159)/(79-98) 133/98 (12/16 0505) SpO2:  [97 %-100 %] 98 % (12/16 0505) Weight:  [51.3 kg] 51.3 kg (12/16 0505) Last BM Date : 10/11/23  Intake/Output from previous day: 12/15 0701 - 12/16 0700 In: 875 [P.O.:875] Out: 150 [Urine:150] Intake/Output this shift: No intake/output data recorded.  General appearance: alert and cooperative Resp: clear to auscultation bilaterally Cardio: regular rate and rhythm GI: soft, minimal tenderness. Incisions look good  Lab Results:  Recent Labs    10/11/23 0459 10/12/23 0450  WBC 7.8 6.8  HGB 11.3* 12.0  HCT 33.9* 37.3  PLT 181 201   BMET Recent Labs    10/11/23 0459 10/12/23 0450  NA 134* 135  K 3.7 4.9  CL 103 104  CO2 26 28  GLUCOSE 87 88  BUN 11 10  CREATININE 0.50 0.59  CALCIUM 8.4* 8.6*   PT/INR No results for input(s): "LABPROT", "INR" in the last 72 hours. ABG No results for input(s): "PHART", "HCO3" in the last 72 hours.  Invalid input(s): "PCO2", "PO2"  Studies/Results: No results found.  Anti-infectives: Anti-infectives (From admission, onward)    Start     Dose/Rate Route Frequency Ordered Stop   10/09/23 0615  cefoTEtan (CEFOTAN) 2 g in sodium chloride 0.9 % 100 mL IVPB        2 g 200 mL/hr over 30 Minutes Intravenous On call to O.R. 10/09/23 0607 10/09/23 0807       Assessment/Plan: s/p Procedure(s): XI ROBOT ASSISTED SUTURE RECTOPEXY (N/A) Regular diet Doing great Much better control, no prolapse to report  We spent time reviewing expectations, plans, procedure, findings, etc. We discussed things to watch out for. All questions were answered, she expressed understanding and agreement  with the plan   LOS: 3 days    Andria Meuse 10/12/2023

## 2023-10-16 ENCOUNTER — Ambulatory Visit: Payer: Medicare HMO | Admitting: Physician Assistant

## 2023-10-22 ENCOUNTER — Ambulatory Visit: Payer: Medicare HMO | Admitting: Physician Assistant

## 2023-11-09 ENCOUNTER — Ambulatory Visit: Payer: Medicare HMO | Admitting: Physician Assistant

## 2023-12-02 ENCOUNTER — Other Ambulatory Visit: Payer: Self-pay | Admitting: Family Medicine

## 2023-12-02 DIAGNOSIS — R911 Solitary pulmonary nodule: Secondary | ICD-10-CM

## 2023-12-09 ENCOUNTER — Telehealth: Payer: Self-pay

## 2023-12-09 NOTE — Telephone Encounter (Signed)
Received fax from pharmacy requesting refills of HC 2.5% ointment 454g. I don't believe you prescribed this, and patient does not have follow up. Please advise.

## 2023-12-10 ENCOUNTER — Ambulatory Visit: Payer: Medicare HMO

## 2023-12-14 MED ORDER — HYDROCORTISONE 2.5 % EX OINT: TOPICAL_OINTMENT | Freq: Two times a day (BID) | CUTANEOUS | 0 refills | Status: AC

## 2023-12-14 NOTE — Addendum Note (Signed)
Addended by: Cari Caraway A on: 12/14/2023 07:58 AM   Modules accepted: Orders

## 2023-12-18 ENCOUNTER — Ambulatory Visit: Payer: Medicare HMO

## 2024-01-18 ENCOUNTER — Emergency Department

## 2024-01-18 ENCOUNTER — Encounter: Payer: Self-pay | Admitting: Emergency Medicine

## 2024-01-18 ENCOUNTER — Other Ambulatory Visit: Payer: Self-pay

## 2024-01-18 ENCOUNTER — Emergency Department
Admission: EM | Admit: 2024-01-18 | Discharge: 2024-01-18 | Disposition: A | Discharge status: Home / Self Care | Attending: Emergency Medicine | Admitting: Emergency Medicine

## 2024-01-18 DIAGNOSIS — R531 Weakness: Secondary | ICD-10-CM | POA: Insufficient documentation

## 2024-01-18 DIAGNOSIS — I6381 Other cerebral infarction due to occlusion or stenosis of small artery: Secondary | ICD-10-CM | POA: Insufficient documentation

## 2024-01-18 DIAGNOSIS — Z5321 Procedure and treatment not carried out due to patient leaving prior to being seen by health care provider: Secondary | ICD-10-CM | POA: Insufficient documentation

## 2024-01-18 DIAGNOSIS — R9431 Abnormal electrocardiogram [ECG] [EKG]: Secondary | ICD-10-CM | POA: Diagnosis not present

## 2024-01-18 LAB — CBC
HCT: 46.2 % — ABNORMAL HIGH (ref 36.0–46.0)
Hemoglobin: 15.5 g/dL — ABNORMAL HIGH (ref 12.0–15.0)
MCH: 29 pg (ref 26.0–34.0)
MCHC: 33.5 g/dL (ref 30.0–36.0)
MCV: 86.4 fL (ref 80.0–100.0)
Platelets: 190 10*3/uL (ref 150–400)
RBC: 5.35 MIL/uL — ABNORMAL HIGH (ref 3.87–5.11)
RDW: 15.9 % — ABNORMAL HIGH (ref 11.5–15.5)
WBC: 13.8 10*3/uL — ABNORMAL HIGH (ref 4.0–10.5)
nRBC: 0 % (ref 0.0–0.2)

## 2024-01-18 LAB — COMPREHENSIVE METABOLIC PANEL
ALT: 43 U/L (ref 0–44)
AST: 35 U/L (ref 15–41)
Albumin: 4.1 g/dL (ref 3.5–5.0)
Alkaline Phosphatase: 63 U/L (ref 38–126)
Anion gap: 11 (ref 5–15)
BUN: 16 mg/dL (ref 8–23)
CO2: 22 mmol/L (ref 22–32)
Calcium: 9.1 mg/dL (ref 8.9–10.3)
Chloride: 102 mmol/L (ref 98–111)
Creatinine, Ser: 0.63 mg/dL (ref 0.44–1.00)
GFR, Estimated: >60 mL/min (ref >60)
Glucose, Bld: 91 mg/dL (ref 70–99)
Potassium: 3 mmol/L — ABNORMAL LOW (ref 3.5–5.1)
Sodium: 135 mmol/L (ref 135–145)
Total Bilirubin: 0.4 mg/dL (ref 0.0–1.2)
Total Protein: 7.1 g/dL (ref 6.5–8.1)

## 2024-01-18 LAB — DIFFERENTIAL
Abs Immature Granulocytes: 0.12 10*3/uL — ABNORMAL HIGH (ref 0.00–0.07)
Basophils Absolute: 0 10*3/uL (ref 0.0–0.1)
Basophils Relative: 0 %
Eosinophils Absolute: 0.1 10*3/uL (ref 0.0–0.5)
Eosinophils Relative: 1 %
Immature Granulocytes: 1 %
Lymphocytes Relative: 22 %
Lymphs Abs: 3 10*3/uL (ref 0.7–4.0)
Monocytes Absolute: 1.2 10*3/uL — ABNORMAL HIGH (ref 0.1–1.0)
Monocytes Relative: 9 %
Neutro Abs: 9.3 10*3/uL — ABNORMAL HIGH (ref 1.7–7.7)
Neutrophils Relative %: 67 %

## 2024-01-18 LAB — ETHANOL: Alcohol, Ethyl (B): <10 mg/dL (ref <10)

## 2024-01-18 MED ORDER — SODIUM CHLORIDE 0.9% FLUSH: 3.0000 mL | Freq: Once | INTRAVENOUS | Status: DC

## 2024-01-18 NOTE — ED Triage Notes (Signed)
 Patient to ED via POV for generalized weakness- started yesterday. Denies unilateral weakness/ numbness, no facial droop. Clear speech. Pt's daughter states last night she was not acting like her self and very confused. Better today. LWK 1.5 days ago.

## 2024-01-19 ENCOUNTER — Telehealth: Payer: Self-pay | Admitting: Emergency Medicine

## 2024-01-19 NOTE — Telephone Encounter (Signed)
 Daughter called back.  She says the patient continues with intermittent altered mental status.  Says patient refuses to return due to long wait.  Says the symptoms have been for 2 days.   Prior to that pt alert and oriented.  Daughter is asking if a stroke showed on the CT.  I explained that there were many reasons that she could be confused including neuro issue, infection or anything else. I told her several times that I recommend that she return for exam.  Daughter says patient will not come back.  She does have appt on Thursday with pcp.

## 2024-01-19 NOTE — Telephone Encounter (Signed)
 Called patient due to left emergency department before provider exam to inquire about condition and follow up plans. Left message.

## 2024-01-25 ENCOUNTER — Other Ambulatory Visit: Payer: Self-pay

## 2024-01-25 ENCOUNTER — Emergency Department

## 2024-01-25 ENCOUNTER — Observation Stay
Admission: EM | Admit: 2024-01-25 | Discharge: 2024-01-26 | Disposition: A | Discharge status: Home / Self Care | Attending: Internal Medicine | Admitting: Internal Medicine

## 2024-01-25 DIAGNOSIS — M51369 Other intervertebral disc degeneration, lumbar region without mention of lumbar back pain or lower extremity pain: Secondary | ICD-10-CM | POA: Diagnosis not present

## 2024-01-25 DIAGNOSIS — Z1152 Encounter for screening for COVID-19: Secondary | ICD-10-CM | POA: Insufficient documentation

## 2024-01-25 DIAGNOSIS — F17210 Nicotine dependence, cigarettes, uncomplicated: Secondary | ICD-10-CM | POA: Diagnosis not present

## 2024-01-25 DIAGNOSIS — E162 Hypoglycemia, unspecified: Secondary | ICD-10-CM | POA: Insufficient documentation

## 2024-01-25 DIAGNOSIS — E871 Hypo-osmolality and hyponatremia: Secondary | ICD-10-CM | POA: Insufficient documentation

## 2024-01-25 DIAGNOSIS — G9341 Metabolic encephalopathy: Secondary | ICD-10-CM | POA: Diagnosis not present

## 2024-01-25 DIAGNOSIS — D696 Thrombocytopenia, unspecified: Secondary | ICD-10-CM

## 2024-01-25 DIAGNOSIS — Z79899 Other long term (current) drug therapy: Secondary | ICD-10-CM | POA: Insufficient documentation

## 2024-01-25 DIAGNOSIS — R4182 Altered mental status, unspecified: Secondary | ICD-10-CM | POA: Diagnosis present

## 2024-01-25 DIAGNOSIS — I10 Essential (primary) hypertension: Secondary | ICD-10-CM | POA: Diagnosis not present

## 2024-01-25 DIAGNOSIS — E785 Hyperlipidemia, unspecified: Secondary | ICD-10-CM

## 2024-01-25 DIAGNOSIS — F32A Depression, unspecified: Secondary | ICD-10-CM | POA: Diagnosis not present

## 2024-01-25 DIAGNOSIS — N3001 Acute cystitis with hematuria: Principal | ICD-10-CM

## 2024-01-25 DIAGNOSIS — N3000 Acute cystitis without hematuria: Secondary | ICD-10-CM | POA: Diagnosis not present

## 2024-01-25 DIAGNOSIS — N390 Urinary tract infection, site not specified: Secondary | ICD-10-CM

## 2024-01-25 DIAGNOSIS — L299 Pruritus, unspecified: Secondary | ICD-10-CM | POA: Diagnosis not present

## 2024-01-25 LAB — COMPREHENSIVE METABOLIC PANEL WITH GFR
ALT: 27 U/L (ref 0–44)
AST: 19 U/L (ref 15–41)
Albumin: 3.6 g/dL (ref 3.5–5.0)
Alkaline Phosphatase: 62 U/L (ref 38–126)
Anion gap: 7 (ref 5–15)
BUN: 13 mg/dL (ref 8–23)
CO2: 25 mmol/L (ref 22–32)
Calcium: 8.8 mg/dL — ABNORMAL LOW (ref 8.9–10.3)
Chloride: 102 mmol/L (ref 98–111)
Creatinine, Ser: 0.55 mg/dL (ref 0.44–1.00)
GFR, Estimated: >60 mL/min (ref >60)
Glucose, Bld: 91 mg/dL (ref 70–99)
Potassium: 3.5 mmol/L (ref 3.5–5.1)
Sodium: 134 mmol/L — ABNORMAL LOW (ref 135–145)
Total Bilirubin: 0.5 mg/dL (ref 0.0–1.2)
Total Protein: 6.4 g/dL — ABNORMAL LOW (ref 6.5–8.1)

## 2024-01-25 LAB — RESP PANEL BY RT-PCR (RSV, FLU A&B, COVID)  RVPGX2
Influenza A by PCR: NEGATIVE
Influenza B by PCR: NEGATIVE
Resp Syncytial Virus by PCR: NEGATIVE
SARS Coronavirus 2 by RT PCR: NEGATIVE

## 2024-01-25 LAB — URINALYSIS, ROUTINE W REFLEX MICROSCOPIC
Bilirubin Urine: NEGATIVE
Glucose, UA: NEGATIVE mg/dL
Hgb urine dipstick: NEGATIVE
Ketones, ur: NEGATIVE mg/dL
Nitrite: POSITIVE — AB
Protein, ur: NEGATIVE mg/dL
Specific Gravity, Urine: 1.005 (ref 1.005–1.030)
pH: 7 (ref 5.0–8.0)

## 2024-01-25 LAB — CBC
HCT: 41.1 % (ref 36.0–46.0)
Hemoglobin: 14.1 g/dL (ref 12.0–15.0)
MCH: 29.2 pg (ref 26.0–34.0)
MCHC: 34.3 g/dL (ref 30.0–36.0)
MCV: 85.1 fL (ref 80.0–100.0)
Platelets: 107 10*3/uL — ABNORMAL LOW (ref 150–400)
RBC: 4.83 MIL/uL (ref 3.87–5.11)
RDW: 15.2 % (ref 11.5–15.5)
WBC: 12.5 10*3/uL — ABNORMAL HIGH (ref 4.0–10.5)
nRBC: 0 % (ref 0.0–0.2)

## 2024-01-25 LAB — T4, FREE: Free T4: 0.61 ng/dL (ref 0.61–1.12)

## 2024-01-25 LAB — MAGNESIUM: Magnesium: 2.2 mg/dL (ref 1.7–2.4)

## 2024-01-25 LAB — CBG MONITORING, ED: Glucose-Capillary: 107 mg/dL — ABNORMAL HIGH (ref 70–99)

## 2024-01-25 LAB — TSH: TSH: 4.37 u[IU]/mL (ref 0.350–4.500)

## 2024-01-25 LAB — AMMONIA: Ammonia: 28 umol/L (ref 9–35)

## 2024-01-25 MED ORDER — BUSPIRONE HCL 10 MG PO TABS: 5.0000 mg | ORAL_TABLET | Freq: Two times a day (BID) | ORAL | Status: DC | PRN

## 2024-01-25 MED ORDER — ATORVASTATIN CALCIUM 20 MG PO TABS
20.0000 mg | ORAL_TABLET | Freq: Every day | ORAL | Status: DC
  Administered 2024-01-25: 20 mg via ORAL
  Filled 2024-01-25: qty 1

## 2024-01-25 MED ORDER — ACETAMINOPHEN 325 MG PO TABS: 650.0000 mg | ORAL_TABLET | Freq: Four times a day (QID) | ORAL | Status: DC | PRN

## 2024-01-25 MED ORDER — ONDANSETRON HCL 4 MG/2ML IJ SOLN: 4.0000 mg | Freq: Four times a day (QID) | INTRAMUSCULAR | Status: DC | PRN

## 2024-01-25 MED ORDER — OXYBUTYNIN CHLORIDE ER 5 MG PO TB24
15.0000 mg | ORAL_TABLET | Freq: Every day | ORAL | Status: DC
  Administered 2024-01-25: 15 mg via ORAL
  Filled 2024-01-25: qty 1

## 2024-01-25 MED ORDER — HYDROCORTISONE 1 % EX CREA
TOPICAL_CREAM | Freq: Two times a day (BID) | CUTANEOUS | Status: DC | PRN
  Filled 2024-01-25: qty 28

## 2024-01-25 MED ORDER — LOSARTAN POTASSIUM 50 MG PO TABS
100.0000 mg | ORAL_TABLET | Freq: Every day | ORAL | Status: DC
  Administered 2024-01-26: 100 mg via ORAL
  Filled 2024-01-25: qty 2

## 2024-01-25 MED ORDER — SODIUM CHLORIDE 0.9 % IV SOLN
2.0000 g | Freq: Once | INTRAVENOUS | Status: AC
  Administered 2024-01-25: 2 g via INTRAVENOUS
  Filled 2024-01-25: qty 20

## 2024-01-25 MED ORDER — SODIUM CHLORIDE 0.9 % IV SOLN
2.0000 g | INTRAVENOUS | Status: DC
  Filled 2024-01-25: qty 20

## 2024-01-25 MED ORDER — VENLAFAXINE HCL ER 75 MG PO CP24
75.0000 mg | ORAL_CAPSULE | Freq: Every day | ORAL | Status: DC
  Administered 2024-01-26: 75 mg via ORAL
  Filled 2024-01-25: qty 1

## 2024-01-25 MED ORDER — ONDANSETRON HCL 4 MG PO TABS: 4.0000 mg | ORAL_TABLET | Freq: Four times a day (QID) | ORAL | Status: DC | PRN

## 2024-01-25 MED ORDER — HEPARIN SODIUM (PORCINE) 5000 UNIT/ML IJ SOLN
5000.0000 [IU] | Freq: Three times a day (TID) | INTRAMUSCULAR | Status: DC
  Administered 2024-01-25 – 2024-01-26 (×2): 5000 [IU] via SUBCUTANEOUS
  Filled 2024-01-25 (×2): qty 1

## 2024-01-25 MED ORDER — PANTOPRAZOLE SODIUM 40 MG PO TBEC
40.0000 mg | DELAYED_RELEASE_TABLET | Freq: Every day | ORAL | Status: DC
  Administered 2024-01-26: 40 mg via ORAL
  Filled 2024-01-25: qty 1

## 2024-01-25 MED ORDER — LATANOPROST 0.005 % OP SOLN
1.0000 [drp] | Freq: Every day | OPHTHALMIC | Status: DC
  Administered 2024-01-25: 1 [drp] via OPHTHALMIC
  Filled 2024-01-25: qty 2.5

## 2024-01-25 MED ORDER — ACETAMINOPHEN 650 MG RE SUPP: 650.0000 mg | Freq: Four times a day (QID) | RECTAL | Status: DC | PRN

## 2024-01-25 MED ORDER — POLYETHYLENE GLYCOL 3350 17 G PO PACK: 17.0000 g | PACK | Freq: Every day | ORAL | Status: DC | PRN

## 2024-01-25 NOTE — Assessment & Plan Note (Signed)
 With anxiety Home venlafaxine XR 75 mg daily resumed Home buspirone 5 mg twice daily as needed for anxiety

## 2024-01-25 NOTE — H&P (Addendum)
 History and Physical   Cheryl [name] WGN:[id number] DOB: [birth year] DOA: 01/25/2024  PCP: Hillery Aldo, MD Outpatient Specialists: Dr. Neale Burly, 6 dermatologist Patient coming from: Home  I have personally briefly reviewed patient's old medical records in [facility] EMR.  Chief Concern: Altered mental status  HPI: Ms. Cheryl [name] is a 78 year old female with history of hypertension, GERD, depression, rectal prolapse, degenerative disc disease, contact dermatitis, who presents to the emergency department for chief concerns of altered mental status.  Vitals in the ED showed temperature of 98.5, respiration rate 20, heart rate of 79, blood pressure 135/99, SpO2 of 99% on room air.  Serum sodium is 134, potassium 3.5, chloride 102, bicarb 25, BUN of 13, serum creatinine 0.55, EGFR greater than 60, nonfasting blood glucose 91, WBC 12.5, hemoglobin of 14.1, platelets of 107.  COVID/influenza A/influenza B/RSV PCR were negative.  Ammonia is 28.  Magnesium 2.2.    UA was positive for small leukocytes and positive for nitrate.  UA was positive for rare bacteria.  ED treatment: Ceftriaxone 2 g IV one-time dose. --------------------------- At bedside, patient is able to tell me her first and last name and she was able to tell me that she is in the hospital.  She was not able to tell me the current calendar year and her age.  She was able to identify her daughter, [name] at bedside.  She does not know why she is in the hospital.  Daughter reports that patient has been altered since Sunday, 01/17/2024. Patient did come to the emergency department however left without being seen.  She denies trauma to her person.  Daughter reports that she was not able to tell family her age, know where she is, she thought she was at a restaurant, patient was not able to tell family the current president or her brother's birthday which she would normally know.  Family did not report fever or vomiting or  diarrhea.  Family states that patient seems to be improving compared to prior to ED presentation.  Social history: She is at home with her boyfriend.  Family denies tobacco, EtOH, recreational drug use.  Patient is retired.  ROS: Constitutional: no weight change, no fever ENT/Mouth: no sore throat, no rhinorrhea Eyes: no eye pain, no vision changes Cardiovascular: no chest pain, no dyspnea,  no edema, no palpitations Respiratory: no cough, no sputum, no wheezing Gastrointestinal: no nausea, no vomiting, no diarrhea, no constipation Genitourinary: no urinary incontinence, no dysuria, no hematuria Musculoskeletal: no arthralgias, no myalgias Skin: no skin lesions, no pruritus, Neuro: no weakness, no loss of consciousness, no syncope Psych: no anxiety, no depression, no decrease appetite Heme/Lymph: no bruising, no bleeding  ED Course: Discussed with EDP, patient requiring hospitalization for chief concerns of altered mental status secondary to UTI.  Assessment/Plan  Principal Problem:   Altered mental status Active Problems:   UTI (urinary tract infection)   Degenerative lumbar disc   Essential hypertension   Depression   Hyperlipidemia   Pruritus   Assessment and Plan:  * Altered mental status Suspect secondary to urinary tract infection Continue with ceftriaxone 2 g IV daily Telemetry medical, observation  UTI (urinary tract infection) Present on admission Continue with ceftriaxone 2 g IV daily to complete a 5-day course  Pruritus  Per daughter, patient was diagnosed with skin lupus and due to recent confusion, altered mental status over the last week, outpatient dermatologist has recommended to discontinue the medication for now Daughter reports patient now has had worsening skin  interruptions due to discontinuing her skin medications As needed hydrocortisone cream 1% twice daily as needed for itching ordered on admission for 3 days  Hyperlipidemia Home  atorvastatin 20 mg nightly resumed  Depression With anxiety Home venlafaxine XR 75 mg daily resumed Home buspirone 5 mg twice daily as needed for anxiety  Essential hypertension Home losartan 100 mg daily resumed  Chart reviewed.   DVT prophylaxis: Heparin 5000 units subcutaneous every 8 hours Code Status: Full code Diet: Heart healthy Family Communication: Updated daughter, Susie at bedside with patient's permission Disposition Plan: Pending clinical course Consults called: None at this time Admission status: Telemetry medical, observation  Past Medical History:  Diagnosis Date   Abdominal aneurysm (HCC)    Allergy    seasonal   Anxiety    Arthritis    hips   Cataract    Complication of anesthesia    facial swelling after hysterectomy   Depression    Gastric ulcer 10/27/2010   Hypertension    Hypokalemia    Hyponatremia    Lymphocytic colitis    Osteopenia    Peptic ulcer    Piriformis syndrome    Pneumonia    Pre-diabetes    Radiculopathy of lumbar region    Ulcerative colitis (HCC)    Vertigo    with sinus issues   Wears dentures    partial upper   Past Surgical History:  Procedure Laterality Date   ABDOMINAL HYSTERECTOMY     APPENDECTOMY     BACK SURGERY     BREAST BIOPSY Left 2012   negative   CATARACT EXTRACTION, BILATERAL Bilateral 2005   COLONOSCOPY  2012   COLONOSCOPY WITH PROPOFOL N/A 10/11/2015   Procedure: COLONOSCOPY WITH PROPOFOL;  Surgeon: Midge Minium, MD;  Location: [facility];  Service: Endoscopy;  Laterality: N/A;   COLONOSCOPY WITH PROPOFOL N/A 05/24/2021   Procedure: COLONOSCOPY WITH PROPOFOL;  Surgeon: Earline Mayotte, MD;  Location: [facility];  Service: Gastroenterology;  Laterality: N/A;   ESOPHAGOGASTRODUODENOSCOPY N/A 05/24/2021   Procedure: ESOPHAGOGASTRODUODENOSCOPY (EGD);  Surgeon: Earline Mayotte, MD;  Location: [facility];  Service: Gastroenterology;  Laterality: N/A;   POLYPECTOMY  10/11/2015    Procedure: POLYPECTOMY;  Surgeon: Midge Minium, MD;  Location: [facility];  Service: Endoscopy;;   XI ROBOT ASSISTED RECTOPEXY N/A 10/09/2023   Procedure: XI ROBOT ASSISTED SUTURE RECTOPEXY;  Surgeon: Andria Meuse, MD;  Location: [facility];  Service: General;  Laterality: N/A;   Social History:  reports that she has been smoking cigarettes. She has never used smokeless tobacco. She reports that she does not currently use alcohol after a past usage of about 3.0 standard drinks of alcohol per week. She reports that she does not use drugs.  Allergies  Allergen Reactions   Gramineae Pollens Other (See Comments)   Doxycycline Nausea Only and Palpitations    Shaky   Sulfa Antibiotics Nausea Only    Shaking    Family History  Problem Relation Age of Onset   Arthritis Mother    Diabetes Mother    Depression Mother    COPD Mother    Heart disease Mother    Hypertension Mother    Alcohol abuse Father    Stroke Father    Stroke Paternal Aunt    Breast cancer Maternal Grandmother    Family history: Family history reviewed and not pertinent.  Prior to Admission medications   Medication Sig Start Date End Date Taking? Authorizing Provider  atorvastatin (LIPITOR) 20 MG  tablet Take 20 mg by mouth at bedtime.    [provider]  busPIRone (BUSPAR) 5 MG tablet Take 5 mg by mouth 2 (two) times daily.    [provider]  cetirizine (ZYRTEC) 10 MG tablet Take 1 tablet by mouth twice daily Patient taking differently: Take 10 mg by mouth daily. 06/15/23   Elie Goody, MD  esterified estrogens (MENEST) 0.625 MG tablet Take 0.625 mg by mouth daily.    [provider]  hydrocortisone 2.5 % ointment Apply topically 2 (two) times daily. Apply to aa's rash BID PRN. 12/14/23   Elie Goody, MD  Iron-Vitamins (GERITOL PO) Take 1 tablet by mouth daily.    [provider]  latanoprost (XALATAN) 0.005 % ophthalmic solution Place 1 drop into both eyes  at bedtime.    [provider]  losartan (COZAAR) 100 MG tablet Take 100 mg by mouth daily. 09/09/23   [provider]  Multiple Vitamins-Minerals (ZINC PO) Take 1 tablet by mouth daily.    [provider]  oxybutynin (DITROPAN XL) 15 MG 24 hr tablet Take 15 mg by mouth at bedtime. 11/03/19   [provider]  oxyCODONE (ROXICODONE) 5 MG immediate release tablet Take 1 tablet (5 mg total) by mouth every 8 (eight) hours as needed (postop pain not controlled with tylenol and ibuprofen first). 10/09/23 10/08/24  Andria Meuse, MD  pantoprazole (PROTONIX) 40 MG tablet Take 40 mg by mouth daily.     [provider]  polyethylene glycol (MIRALAX / GLYCOLAX) 17 g packet Take 17 g by mouth daily. 10/09/23   Andria Meuse, MD  venlafaxine XR (EFFEXOR-XR) 75 MG 24 hr capsule venlafaxine ER 75 mg capsule,extended release 24 hr    [provider]    Physical Exam: Vitals:   01/25/24 1319 01/25/24 1724 01/25/24 1809  BP: (!) 181/96 (!) 135/99 (!) 149/93  Pulse: 94 79 76  Resp: 20 20 16   Temp: 98.1 F (36.7 C)  98.5 F (36.9 C)  TempSrc: Oral  Oral  SpO2: 96% 99% 97%   Constitutional: appears frail Eyes: PERRL, lids and conjunctivae normal ENMT: Mucous membranes are moist. Posterior pharynx clear of any exudate or lesions. Age-appropriate dentition. Hearing appropriate Neck: normal, supple, no masses, no thyromegaly Respiratory: clear to auscultation bilaterally, no wheezing, no crackles. Normal respiratory effort. No accessory muscle use.  Cardiovascular: Regular rate and rhythm, no murmurs / rubs / gallops. No extremity edema. 2+ pedal pulses. No carotid bruits.  Abdomen: no tenderness, no masses palpated, no hepatosplenomegaly. Bowel sounds positive.  Musculoskeletal: no clubbing / cyanosis. No joint deformity upper and lower extremities. Good ROM, no contractures, no atrophy. Normal muscle tone.  Skin: + multiple skin  rashes Neurologic: Sensation intact. Strength 5/5 in all 4.  Psychiatric: lack judgment and insight. Alert and oriented x 2 self and location.  Depressed mood.  Flat affect  EKG: independently reviewed, showing sinus rhythm with rate of 94, QTc 450  Chest x-ray on Admission: Not indicated at this time  MR BRAIN WO CONTRAST Result Date: 01/25/2024 CLINICAL DATA:  New onset of disorientation for 1 week. Abnormal CT scan 01/18/2024. EXAM: MRI HEAD WITHOUT CONTRAST TECHNIQUE: Multiplanar, multiecho pulse sequences of the brain and surrounding structures were obtained without intravenous contrast. COMPARISON:  CT head without contrast 01/18/2024. FINDINGS: Brain: A remote lacunar infarct in the lateral right thalamus accounts for the CT finding. Periventricular white matter changes are mildly advanced for age. Moderate T2 hyperintensities extend into the  corona radiata bilaterally. The ventricles are of normal size. No significant extraaxial fluid collection is present. Diffuse white matter changes are present within the central pons. The cerebellum is unremarkable. The internal auditory canals are within normal limits. Vascular: Flow is present in the major intracranial arteries. Skull and upper cervical spine: The craniocervical junction is normal. Upper cervical spine is within normal limits. Marrow signal is unremarkable. Sinuses/Orbits: The paranasal sinuses and mastoid air cells are clear. Bilateral lens replacements are noted. Globes and orbits are otherwise unremarkable. IMPRESSION: 1. No acute intracranial abnormality. 2. 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 3. Periventricular white matter disease is mildly advanced for age. This likely reflects the sequela of chronic microvascular ischemia. Electronically Signed   By: Marin Roberts M.D.   On: 01/25/2024 18:32   Labs on Admission: I have personally reviewed following labs  CBC: Recent Labs  Lab  01/25/24 1323  WBC 12.5*  HGB 14.1  HCT 41.1  MCV 85.1  PLT 107*   Basic Metabolic Panel: Recent Labs  Lab 01/25/24 1323 01/25/24 1700  NA 134*  --   K 3.5  --   CL 102  --   CO2 25  --   GLUCOSE 91  --   BUN 13  --   CREATININE 0.55  --   CALCIUM 8.8*  --   MG  --  2.2   GFR: Estimated Creatinine Clearance: 42.3 mL/min (by C-G formula based on SCr of 0.55 mg/dL).  Liver Function Tests: Recent Labs  Lab 01/25/24 1323  AST 19  ALT 27  ALKPHOS 62  BILITOT 0.5  PROT 6.4*  ALBUMIN 3.6   Recent Labs  Lab 01/25/24 1700  AMMONIA 28   CBG: Recent Labs  Lab 01/25/24 1321  GLUCAP 107*   Thyroid Function Tests: Recent Labs    01/25/24 1700  TSH 4.370  FREET4 0.61   Urine analysis:    Component Value Date/Time   COLORURINE YELLOW (A) 01/25/2024 1835   APPEARANCEUR HAZY (A) 01/25/2024 1835   LABSPEC 1.005 01/25/2024 1835   PHURINE 7.0 01/25/2024 1835   GLUCOSEU NEGATIVE 01/25/2024 1835   HGBUR NEGATIVE 01/25/2024 1835   BILIRUBINUR NEGATIVE 01/25/2024 1835   KETONESUR NEGATIVE 01/25/2024 1835   PROTEINUR NEGATIVE 01/25/2024 1835   NITRITE POSITIVE (A) 01/25/2024 1835   LEUKOCYTESUR SMALL (A) 01/25/2024 1835   This document was prepared using Dragon Voice Recognition software and may include unintentional dictation errors.  Dr. Sedalia Muta [facility]  If 7PM-7AM, please contact overnight-coverage provider If 7AM-7PM, please contact day attending provider [email or web address]  01/25/2024, 10:24 PM

## 2024-01-25 NOTE — Assessment & Plan Note (Addendum)
  Per daughter, patient was diagnosed with skin lupus and due to recent confusion, altered mental status over the last week, outpatient dermatologist has recommended to discontinue the medication for now Daughter reports patient now has had worsening skin interruptions due to discontinuing her skin medications As needed hydrocortisone cream 1% twice daily as needed for itching ordered on admission for 3 days

## 2024-01-25 NOTE — Assessment & Plan Note (Signed)
 Present on admission Continue with ceftriaxone 2 g IV daily to complete a 5-day course

## 2024-01-25 NOTE — ED Provider Notes (Signed)
 [facility] Provider Note    Event Date/Time   First MD Initiated Contact with Patient 01/25/24 1636     (approximate)   History   Altered Mental Status   HPI Cheryl [name] is a 78 y.o. female with history of HTN, HLD, lupus presenting today for altered mental status.  Family at bedside provides most of the history.  Patient herself denies all review of symptoms at this time but does not know why she is in the ED.  Patient sister at bedside states 1 week ago she had onset of altered mental status.  She was disoriented and can state her name but otherwise was not remembering things.  Prior to this she was fully alert and oriented x 4 and could hold a full conversation.  They originally brought her to the ED where she had a CT which showed possible concern for recent CVA and recommended follow-up MRI but patient left before this could be obtained.  Symptoms have persisted in the past week since then.  They have not noted any other symptoms such as cough, congestion, vomiting, chest pain, abdominal pain, dysuria.     Physical Exam   Triage Vital Signs: ED Triage Vitals  Encounter Vitals Group     BP 01/25/24 1319 (!) 181/96     Systolic BP Percentile --      Diastolic BP Percentile --      Pulse Rate 01/25/24 1319 94     Resp 01/25/24 1319 20     Temp 01/25/24 1319 98.1 F (36.7 C)     Temp Source 01/25/24 1319 Oral     SpO2 01/25/24 1319 96 %     Weight --      Height --      Head Circumference --      Peak Flow --      Pain Score 01/25/24 1320 0     Pain Loc --      Pain Education --      Exclude from Growth Chart --     Most recent vital signs: Vitals:   01/25/24 1724 01/25/24 1809  BP: (!) 135/99 (!) 149/93  Pulse: 79 76  Resp: 20 16  Temp:  98.5 F (36.9 C)  SpO2: 99% 97%   Physical Exam: I have reviewed the vital signs and nursing notes. General: Awake, alert, no acute distress.  Pleasantly disoriented Head:  Atraumatic,  normocephalic.   ENT:  EOM intact, PERRL. Oral mucosa is pink and moist with no lesions. Neck: Neck is supple with full range of motion, No meningeal signs. Cardiovascular:  RRR, No murmurs. Peripheral pulses palpable and equal bilaterally. Respiratory:  Symmetrical chest wall expansion.  No rhonchi, rales, or wheezes.  Good air movement throughout.  No use of accessory muscles.   Musculoskeletal:  No cyanosis or edema. Moving extremities with full ROM Abdomen:  Soft, nontender, nondistended. Neuro:  GCS 14 (oriented only to name), moving all four extremities, interacting appropriately. Speech clear.  Cranial nerves II through XII intact.  No obvious sensation deficit to bilateral upper or lower extremities.  5 out of 5 strength throughout bilateral upper and lower extremities. Psych:  Calm, appropriate.   Skin:  Warm, dry, no rash.    ED Results / Procedures / Treatments   Labs (all labs ordered are listed, but only abnormal results are displayed) Labs Reviewed  COMPREHENSIVE METABOLIC PANEL WITH GFR - Abnormal; Notable for the following components:  Result Value   Sodium 134 (*)    Calcium 8.8 (*)    Total Protein 6.4 (*)    All other components within normal limits  CBC - Abnormal; Notable for the following components:   WBC 12.5 (*)    Platelets 107 (*)    All other components within normal limits  URINALYSIS, ROUTINE W REFLEX MICROSCOPIC - Abnormal; Notable for the following components:   Color, Urine YELLOW (*)    APPearance HAZY (*)    Nitrite POSITIVE (*)    Leukocytes,Ua SMALL (*)    Bacteria, UA RARE (*)    All other components within normal limits  CBG MONITORING, ED - Abnormal; Notable for the following components:   Glucose-Capillary 107 (*)    All other components within normal limits  RESP PANEL BY RT-PCR (RSV, FLU A&B, COVID)  RVPGX2  AMMONIA  TSH  T4, FREE  MAGNESIUM     EKG    RADIOLOGY Independently interpreted MRI with no acute CVA or other  acute pathology   PROCEDURES:  Critical Care performed: No  Procedures   MEDICATIONS ORDERED IN ED: Medications  cefTRIAXone (ROCEPHIN) 2 g in sodium chloride 0.9 % 100 mL IVPB (has no administration in time range)     IMPRESSION / MDM / ASSESSMENT AND PLAN / ED COURSE  I reviewed the triage vital signs and the nursing notes.                              Differential diagnosis includes, but is not limited to, UTI, CVA, respiratory infection, electrolyte abnormality, hyperammonemia, delirium  Patient's presentation is most consistent with acute presentation with potential threat to life or bodily function.  Patient is a 78 year old female presenting today for altered mental status and disorientation over the past week.  No other new acute focal neurological deficits or other obvious symptoms on ROS.  Exam largely unremarkable outside of being only oriented to name which is severely different from her baseline.  Hypertension on vitals but otherwise stable.  Laboratory workup with slight leukocytosis on CBC at 12.5.  CMP reassuring.  Reassuring and negative thyroid panel.  Normal magnesium, ammonia.  MRI shows no evidence of acute CVA.  Most notably she does have a positive UA likely indicating UTI with nitrites and leukocytes present along with bacteria.  Suspect this is likely the source of her altered mental status with otherwise negative workup.  Given profound change in the mental status with decreased p.o. intake at home and fatigue, will start on IV ceftriaxone admit to hospitalist for observation.  The patient is on the cardiac monitor to evaluate for evidence of arrhythmia and/or significant heart rate changes.     FINAL CLINICAL IMPRESSION(S) / ED DIAGNOSES   Final diagnoses:  Acute cystitis with hematuria  Altered mental status, unspecified altered mental status type     Rx / DC Orders   ED Discharge Orders     None        Note:  This document was prepared  using Dragon voice recognition software and may include unintentional dictation errors.   [name], MD 01/25/24 [phone]

## 2024-01-25 NOTE — Assessment & Plan Note (Signed)
 Home losartan 100 mg daily resumed

## 2024-01-25 NOTE — ED Triage Notes (Signed)
 Pt to ED via [facility] from home. Pt lives with boyfriend. Pt's daughter was with her a week ago and reports noticed increasing confusion. Pt is forgetting what she is doing and doesn't know where she is. Pt A&Ox1. Pt was seen on the 3/24 for same. Speech clear. No vision changes.

## 2024-01-25 NOTE — ED Notes (Signed)
 CCMD notified of patient monitoring.

## 2024-01-25 NOTE — Hospital Course (Signed)
 [name] is a 78 year old female with history of hypertension, GERD, depression, rectal prolapse, degenerative disc disease, contact dermatitis, who presents to the emergency department for chief concerns of altered mental status.  Vitals in the ED showed temperature of 98.5, respiration rate 20, heart rate of 79, blood pressure 135/99, SpO2 of 99% on room air.  Serum sodium is 134, potassium 3.5, chloride 102, bicarb 25, BUN of 13, serum creatinine 0.55, EGFR greater than 60, nonfasting blood glucose 91, WBC 12.5, hemoglobin of 14.1, platelets of 107.  COVID/influenza A/influenza B/RSV PCR were negative.  Ammonia is 28.  Magnesium 2.2.    UA was positive for small leukocytes and positive for nitrate.  UA was positive for rare bacteria.  ED treatment: Ceftriaxone 2 g IV one-time dose.  4/1.  Patient's mental status much improved.  Patient and family want to take her home.  Urine culture still pending.  Will give a dose of Rocephin prior to discharge and a fluid bolus.  Can start Keflex tomorrow.  Case discussed with PCP and she recommends stopping Plaquenil because she had a low sugar in the office.  Patient's sugars have been okay here.  Patient has a glucometer and check her sugar every time she feels funny.

## 2024-01-25 NOTE — Assessment & Plan Note (Deleted)
 Suspect secondary to urinary tract infection Continue with ceftriaxone 2 g IV daily Telemetry medical, observation

## 2024-01-25 NOTE — Assessment & Plan Note (Signed)
 Continue atorvastatin

## 2024-01-26 DIAGNOSIS — E785 Hyperlipidemia, unspecified: Secondary | ICD-10-CM

## 2024-01-26 DIAGNOSIS — L299 Pruritus, unspecified: Secondary | ICD-10-CM

## 2024-01-26 DIAGNOSIS — E871 Hypo-osmolality and hyponatremia: Secondary | ICD-10-CM | POA: Insufficient documentation

## 2024-01-26 DIAGNOSIS — F3289 Other specified depressive episodes: Secondary | ICD-10-CM

## 2024-01-26 DIAGNOSIS — D696 Thrombocytopenia, unspecified: Secondary | ICD-10-CM

## 2024-01-26 DIAGNOSIS — E162 Hypoglycemia, unspecified: Secondary | ICD-10-CM

## 2024-01-26 DIAGNOSIS — I10 Essential (primary) hypertension: Secondary | ICD-10-CM

## 2024-01-26 DIAGNOSIS — G9341 Metabolic encephalopathy: Secondary | ICD-10-CM

## 2024-01-26 DIAGNOSIS — N3000 Acute cystitis without hematuria: Secondary | ICD-10-CM

## 2024-01-26 LAB — BASIC METABOLIC PANEL WITH GFR
Anion gap: 9 (ref 5–15)
BUN: 10 mg/dL (ref 8–23)
CO2: 26 mmol/L (ref 22–32)
Calcium: 8.6 mg/dL — ABNORMAL LOW (ref 8.9–10.3)
Chloride: 102 mmol/L (ref 98–111)
Creatinine, Ser: 0.64 mg/dL (ref 0.44–1.00)
GFR, Estimated: >60 mL/min (ref >60)
Glucose, Bld: 80 mg/dL (ref 70–99)
Potassium: 3.6 mmol/L (ref 3.5–5.1)
Sodium: 137 mmol/L (ref 135–145)

## 2024-01-26 LAB — CBC
HCT: 40.6 % (ref 36.0–46.0)
Hemoglobin: 14.2 g/dL (ref 12.0–15.0)
MCH: 29 pg (ref 26.0–34.0)
MCHC: 35 g/dL (ref 30.0–36.0)
MCV: 82.9 fL (ref 80.0–100.0)
Platelets: 105 10*3/uL — ABNORMAL LOW (ref 150–400)
RBC: 4.9 MIL/uL (ref 3.87–5.11)
RDW: 15.2 % (ref 11.5–15.5)
WBC: 8.5 10*3/uL (ref 4.0–10.5)
nRBC: 0 % (ref 0.0–0.2)

## 2024-01-26 MED ORDER — SODIUM CHLORIDE 0.9 % IV SOLN
1.0000 g | Freq: Once | INTRAVENOUS | Status: AC
  Administered 2024-01-26: 1 g via INTRAVENOUS
  Filled 2024-01-26: qty 10

## 2024-01-26 MED ORDER — ENSURE ENLIVE PO LIQD: 237.0000 mL | Freq: Two times a day (BID) | ORAL | 0 refills | Status: AC

## 2024-01-26 MED ORDER — CEPHALEXIN 500 MG PO CAPS: 500.0000 mg | ORAL_CAPSULE | Freq: Three times a day (TID) | ORAL | 0 refills | Status: AC

## 2024-01-26 MED ORDER — SODIUM CHLORIDE 0.9 % IV BOLUS
250.0000 mL | Freq: Once | INTRAVENOUS | Status: AC
  Administered 2024-01-26: 250 mL via INTRAVENOUS

## 2024-01-26 MED ORDER — ENSURE ENLIVE PO LIQD
237.0000 mL | Freq: Two times a day (BID) | ORAL | Status: DC
  Administered 2024-01-26: 237 mL via ORAL

## 2024-01-26 NOTE — Plan of Care (Signed)

## 2024-01-26 NOTE — Assessment & Plan Note (Signed)
 Resolved.  Likely secondary to urinary tract infection.  PCP states that she also had a low sugar as outpatient and will stop Plaquenil.  Patient has a glucometer and can check her sugar anytime she feels funny.

## 2024-01-26 NOTE — Discharge Instructions (Addendum)
 Stop plaquenil Check fingerstick any time you feel funny or altered mental status Must eat and drink

## 2024-01-26 NOTE — Assessment & Plan Note (Signed)
 Sodium upon discharge in the normal range.

## 2024-01-26 NOTE — Discharge Summary (Signed)
 Physician Discharge Summary   Patient: Cheryl [name] MRN: [id number] DOB: [birth year]  Admit date:     01/25/2024  Discharge date: 01/26/24  Discharge Physician: Alford Highland   PCP: Hillery Aldo, MD   Recommendations at discharge:   Follow-up PCP 5 days  Discharge Diagnoses: Principal Problem:   Acute metabolic encephalopathy Active Problems:   Acute cystitis without hematuria   Essential hypertension   Depression   Hyperlipidemia   Pruritus   Thrombocytopenia (HCC)   Hypoglycemia   Degenerative lumbar disc   Hyponatremia    Hospital Course: Cheryl [name] is a 78 year old female with history of hypertension, GERD, depression, rectal prolapse, degenerative disc disease, contact dermatitis, who presents to the emergency department for chief concerns of altered mental status.  Vitals in the ED showed temperature of 98.5, respiration rate 20, heart rate of 79, blood pressure 135/99, SpO2 of 99% on room air.  Serum sodium is 134, potassium 3.5, chloride 102, bicarb 25, BUN of 13, serum creatinine 0.55, EGFR greater than 60, nonfasting blood glucose 91, WBC 12.5, hemoglobin of 14.1, platelets of 107.  COVID/influenza A/influenza B/RSV PCR were negative.  Ammonia is 28.  Magnesium 2.2.    UA was positive for small leukocytes and positive for nitrate.  UA was positive for rare bacteria.  ED treatment: Ceftriaxone 2 g IV one-time dose.  4/1.  Patient's mental status much improved.  Patient and family want to take her home.  Urine culture still pending.  Will give a dose of Rocephin prior to discharge and a fluid bolus.  Can start Keflex tomorrow.  Case discussed with PCP and she recommends stopping Plaquenil because she had a low sugar in the office.  Patient's sugars have been okay here.  Patient has a glucometer and check her sugar every time she feels funny.  Assessment and Plan: * Acute metabolic encephalopathy Resolved.  Likely secondary to urinary tract infection.   PCP states that she also had a low sugar as outpatient and will stop Plaquenil.  Patient has a glucometer and can check her sugar anytime she feels funny.  Acute cystitis without hematuria Will give another dose of Rocephin prior to discharge.  Prescribed Keflex for 3 days upon discharge starting tomorrow.  PCP will follow-up urine culture.  Essential hypertension Continue losartan 100 mg daily resumed  Depression Continue BuSpar and Effexor  Pruritus Can use over-the-counter hydrocortisone cream and hold Plaquenil.  Hyperlipidemia Continue atorvastatin  Hypoglycemia As outpatient.  Has not had any low sugars here.  Patient has a glucometer and can check her sugar if she feels funny.  Encouraged to eat and stay hydrated.  Discontinued Plaquenil.  Thrombocytopenia (HCC) Follow-up as outpatient.  Platelet count upon discharge 105  Hyponatremia Sodium upon discharge in the normal range.         Consultants: None Procedures performed: None Disposition: Home Diet recommendation:  Regular diet DISCHARGE MEDICATION: Allergies as of 01/26/2024       Reactions   Gramineae Pollens Other (See Comments)   Doxycycline Nausea Only, Palpitations   Shaky   Sulfa Antibiotics Nausea Only   Shaking         Medication List     STOP taking these medications    oxyCODONE 5 MG immediate release tablet Commonly known as: Roxicodone       TAKE these medications    atorvastatin 20 MG tablet Commonly known as: LIPITOR Take 20 mg by mouth at bedtime.   busPIRone 5 MG tablet Commonly known  as: BUSPAR Take 5 mg by mouth 2 (two) times daily.   cephALEXin 500 MG capsule Commonly known as: KEFLEX Take 1 capsule (500 mg total) by mouth 3 (three) times daily for 3 days. Start taking on: January 27, 2024   cetirizine 10 MG tablet Commonly known as: ZYRTEC Take 1 tablet by mouth twice daily What changed: when to take this   esterified estrogens 0.625 MG tablet Commonly known  as: MENEST Take 0.625 mg by mouth daily.   feeding supplement Liqd Take 237 mLs by mouth 2 (two) times daily between meals.   GERITOL PO Take 1 tablet by mouth daily.   hydrocortisone 2.5 % ointment Apply topically 2 (two) times daily. Apply to aa's rash BID PRN.   latanoprost 0.005 % ophthalmic solution Commonly known as: XALATAN Place 1 drop into both eyes at bedtime.   losartan 100 MG tablet Commonly known as: COZAAR Take 100 mg by mouth daily.   oxybutynin 15 MG 24 hr tablet Commonly known as: DITROPAN XL Take 15 mg by mouth at bedtime.   pantoprazole 40 MG tablet Commonly known as: PROTONIX Take 40 mg by mouth daily.   polyethylene glycol 17 g packet Commonly known as: MIRALAX / GLYCOLAX Take 17 g by mouth daily.   venlafaxine XR 75 MG 24 hr capsule Commonly known as: EFFEXOR-XR venlafaxine ER 75 mg capsule,extended release 24 hr   ZINC PO Take 1 tablet by mouth daily.        Follow-up Information     Hillery Aldo, MD Follow up in 5 day(s).   Specialty: Family Medicine Why: offfice to call Contact information: [address] [place] [address] [phone]                Discharge Exam: Ceasar Mons Weights   01/25/24 2238 01/26/24 0048  Weight: 53 kg 53 kg   Physical Exam HENT:     Head: Normocephalic.  Eyes:     General: Lids are normal.     Conjunctiva/sclera: Conjunctivae normal.  Cardiovascular:     Rate and Rhythm: Normal rate and regular rhythm.     Heart sounds: Normal heart sounds, S1 normal and S2 normal.  Pulmonary:     Breath sounds: No decreased breath sounds, wheezing, rhonchi or rales.  Abdominal:     Palpations: Abdomen is soft.     Tenderness: There is no abdominal tenderness.  Musculoskeletal:     Right lower leg: No swelling.     Left lower leg: No swelling.  Skin:    Comments: Patient has some reddish areas that pop up on her back.  Neurological:     Mental Status: She is alert.     Comments:  Patient able to answer questions and follow commands.      Condition at discharge: stable  The results of significant diagnostics from this hospitalization (including imaging, microbiology, ancillary and laboratory) are listed below for reference.   Imaging Studies: MR BRAIN WO CONTRAST Result Date: 01/25/2024 CLINICAL DATA:  New onset of disorientation for 1 week. Abnormal CT scan 01/18/2024. EXAM: MRI HEAD WITHOUT CONTRAST TECHNIQUE: Multiplanar, multiecho pulse sequences of the brain and surrounding structures were obtained without intravenous contrast. COMPARISON:  CT head without contrast 01/18/2024. FINDINGS: Brain: A remote lacunar infarct in the lateral right thalamus accounts for the CT finding. Periventricular white matter changes are mildly advanced for age. Moderate T2 hyperintensities extend into the corona radiata bilaterally. The ventricles are of normal size. No significant extraaxial fluid  collection is present. Diffuse white matter changes are present within the central pons. The cerebellum is unremarkable. The internal auditory canals are within normal limits. Vascular: Flow is present in the major intracranial arteries. Skull and upper cervical spine: The craniocervical junction is normal. Upper cervical spine is within normal limits. Marrow signal is unremarkable. Sinuses/Orbits: The paranasal sinuses and mastoid air cells are clear. Bilateral lens replacements are noted. Globes and orbits are otherwise unremarkable. IMPRESSION: 1. No acute intracranial abnormality. 2. 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 3. Periventricular white matter disease is mildly advanced for age. This likely reflects the sequela of chronic microvascular ischemia. Electronically Signed   By: Marin Roberts M.D.   On: 01/25/2024 18:32   CT HEAD WO CONTRAST Result Date: 01/18/2024 CLINICAL DATA:  Provided history: Neuro deficit, acute, stroke suspected.  Additional history provided: generalized weakness, confusion. EXAM: CT HEAD WITHOUT CONTRAST TECHNIQUE: Contiguous axial images were obtained from the base of the skull through the vertex without intravenous contrast. RADIATION DOSE REDUCTION: This exam was performed according to the departmental dose-optimization program which includes automated exposure control, adjustment of the mA and/or kV according to patient size and/or use of iterative reconstruction technique. COMPARISON:  Head CT 11/22/2010. FINDINGS: Brain: Generalized cerebral atrophy. Lacunar infarct within the right thalamus, new from the prior head CT of 11/22/2010 but otherwise age-indeterminate (series 3, image 12). There is no acute intracranial hemorrhage. No demarcated cortical infarct. No extra-axial fluid collection. No evidence of an intracranial mass. No midline shift. Vascular: No hyperdense vessel. Atherosclerotic calcifications. Skull: No calvarial fracture or aggressive osseous lesion. Sinuses/Orbits: No mass or acute finding within the imaged orbits. 4 mm left frontoethmoidal mucous retention cyst. IMPRESSION: 1. No acute intracranial hemorrhage or acute demarcated cortical infarct. 2. Lacunar infarct within the right thalamus, new from the prior head CT of 11/22/2010 but otherwise age-indeterminate. A brain MRI may be obtained for further evaluation, as clinically warranted. 3. Cerebral atrophy. 4. 4 mm left frontoethmoidal mucous retention cyst. Electronically Signed   By: Jackey Loge D.O.   On: 01/18/2024 12:34    Microbiology: Results for orders placed or performed during the hospital encounter of 01/25/24  Resp panel by RT-PCR (RSV, Flu A&B, Covid) Anterior Nasal Swab     Status: None   Collection Time: 01/25/24  5:00 PM   Specimen: Anterior Nasal Swab  Result Value Ref Range Status   SARS Coronavirus 2 by RT PCR NEGATIVE NEGATIVE Final   Influenza A by PCR NEGATIVE NEGATIVE Final   Influenza B by PCR NEGATIVE NEGATIVE  Final   Resp Syncytial Virus by PCR NEGATIVE NEGATIVE Final    Comment: Performed at [facility], [address] [place], [place] [address]    Labs: CBC: Recent Labs  Lab 01/25/24 1323 01/26/24 0542  WBC 12.5* 8.5  HGB 14.1 14.2  HCT 41.1 40.6  MCV 85.1 82.9  PLT 107* 105*   Basic Metabolic Panel: Recent Labs  Lab 01/25/24 1323 01/25/24 1700 01/26/24 0542  NA 134*  --  137  K 3.5  --  3.6  CL 102  --  102  CO2 25  --  26  GLUCOSE 91  --  80  BUN 13  --  10  CREATININE 0.55  --  0.64  CALCIUM 8.8*  --  8.6*  MG  --  2.2  --    Liver Function Tests: Recent Labs  Lab 01/25/24 1323  AST 19  ALT 27  ALKPHOS 62  BILITOT 0.5  PROT 6.4*  ALBUMIN 3.6   CBG: Recent Labs  Lab 01/25/24 1321  GLUCAP 107*    Discharge time spent: greater than 30 minutes.  Signed: Alford Highland, MD [facility] 01/26/2024

## 2024-01-26 NOTE — Assessment & Plan Note (Addendum)
 As outpatient.  Has not had any low sugars here.  Patient has a glucometer and can check her sugar if she feels funny.  Encouraged to eat and stay hydrated.  Discontinued Plaquenil.

## 2024-01-26 NOTE — Assessment & Plan Note (Signed)
 Will give another dose of Rocephin prior to discharge.  Prescribed Keflex for 3 days upon discharge starting tomorrow.  PCP will follow-up urine culture.

## 2024-01-26 NOTE — Assessment & Plan Note (Addendum)
 Follow-up as outpatient.  Platelet count upon discharge 105

## 2024-01-26 NOTE — Plan of Care (Signed)
  Problem: Education: Goal: Knowledge of General Education information will improve Description: Including pain rating scale, medication(s)/side effects and non-pharmacologic comfort measures Outcome: Progressing   Problem: Clinical Measurements: Goal: Diagnostic test results will improve Outcome: Progressing   Problem: Activity: Goal: Risk for activity intolerance will decrease Outcome: Progressing   Problem: Pain Managment: Goal: General experience of comfort will improve and/or be controlled Outcome: Progressing   Problem: Safety: Goal: Ability to remain free from injury will improve Outcome: Progressing

## 2024-01-27 ENCOUNTER — Other Ambulatory Visit: Payer: Self-pay | Admitting: Family Medicine

## 2024-01-27 DIAGNOSIS — R410 Disorientation, unspecified: Secondary | ICD-10-CM

## 2024-01-27 DIAGNOSIS — I639 Cerebral infarction, unspecified: Secondary | ICD-10-CM

## 2024-02-02 ENCOUNTER — Ambulatory Visit
Admission: RE | Admit: 2024-02-02 | Discharge: 2024-02-02 | Disposition: A | Discharge status: Home / Self Care | Source: Ambulatory Visit | Attending: Family Medicine | Admitting: Family Medicine

## 2024-02-02 DIAGNOSIS — I639 Cerebral infarction, unspecified: Secondary | ICD-10-CM | POA: Insufficient documentation

## 2024-02-02 DIAGNOSIS — R410 Disorientation, unspecified: Secondary | ICD-10-CM | POA: Insufficient documentation

## 2024-02-15 ENCOUNTER — Other Ambulatory Visit: Payer: Self-pay | Admitting: Family Medicine

## 2024-02-15 DIAGNOSIS — R911 Solitary pulmonary nodule: Secondary | ICD-10-CM

## 2024-02-15 DIAGNOSIS — Z1231 Encounter for screening mammogram for malignant neoplasm of breast: Secondary | ICD-10-CM

## 2024-02-22 NOTE — Progress Notes (Signed)
Evaluate for intoxication

## 2024-03-01 ENCOUNTER — Ambulatory Visit
Admission: RE | Admit: 2024-03-01 | Discharge: 2024-03-01 | Disposition: A | Discharge status: Home / Self Care | Source: Ambulatory Visit | Attending: Family Medicine | Admitting: Family Medicine

## 2024-03-01 DIAGNOSIS — R911 Solitary pulmonary nodule: Secondary | ICD-10-CM | POA: Diagnosis present

## 2024-04-20 ENCOUNTER — Ambulatory Visit: Admission: EM | Admit: 2024-04-20 | Discharge: 2024-04-20 | Attending: Family Medicine | Admitting: Family Medicine

## 2024-04-20 DIAGNOSIS — R4182 Altered mental status, unspecified: Secondary | ICD-10-CM | POA: Diagnosis not present

## 2024-04-20 LAB — GLUCOSE, CAPILLARY: Glucose-Capillary: 185 mg/dL — ABNORMAL HIGH (ref 70–99)

## 2024-04-20 NOTE — Discharge Instructions (Signed)
 Your blood sugar was 185.  Your EKG was normal. Your vitals were normal however you are confused with an unknown cause.  You have been advised to follow up immediately in the emergency department for concerning signs or symptoms as discussed during your visit. If you declined EMS transport, please have a family member take you directly to the ED at this time. Do not delay.   Based on concerns about condition, if you do not follow up in the ED, you may risk poor outcomes including worsening of condition, delayed treatment and potentially life threatening issues. If you have declined to go to the ED at this time, you should call your PCP immediately to set up a follow up appointment.

## 2024-04-20 NOTE — ED Provider Notes (Signed)
 [facility]    CSN: [id number] Arrival date & time: 04/20/24  1224      History   Chief Complaint Chief Complaint  Patient presents with   Altered Mental Status    HPI Cheryl [name] is a 78 y.o. female.   HPI  History patient and her sister  Bryce presents for confusion for greater than 24 hours possibly even Sunday.  Sister drove down to check on Imanie after speaking with her on Sunday. Pt's daughter went by the house yesterday and noted to her aunt that her mom was somewhat confused. Denies fever, chest pain, cough, shortness of breath and dysuria.    Pt unsure if she took her medications the past few days or not. Notes that she often forgets her medications.        Past Medical History:  Diagnosis Date   Abdominal aneurysm (HCC)    Allergy    seasonal   Anxiety    Arthritis    hips   Cataract    Complication of anesthesia    facial swelling after hysterectomy   Depression    Gastric ulcer 10/27/2010   Hypertension    Hypokalemia    Hyponatremia    Lymphocytic colitis    Osteopenia    Peptic ulcer    Piriformis syndrome    Pneumonia    Pre-diabetes    Radiculopathy of lumbar region    Ulcerative colitis (HCC)    Vertigo    with sinus issues   Wears dentures    partial upper    Patient Active Problem List   Diagnosis Date Noted   Acute metabolic encephalopathy 01/26/2024   Acute cystitis without hematuria 01/26/2024   Thrombocytopenia (HCC) 01/26/2024   Hypoglycemia 01/26/2024   Hyponatremia 01/26/2024   Essential hypertension 01/25/2024   Depression 01/25/2024   Hyperlipidemia 01/25/2024   Pruritus 01/25/2024   Rectal prolapse 10/09/2023   Chronic diarrhea of unknown origin    Benign neoplasm of ascending colon    Benign neoplasm of cecum    Spinal stenosis, lumbar region, with neurogenic claudication 09/06/2015   DDD (degenerative disc disease), lumbar 03/30/2015   Lumbar facet arthropathy 03/30/2015   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/2015   Degenerative lumbar disc 03/21/2015   Facet syndrome, lumbar 03/21/2015   Lumbar radiculopathy 03/21/2015   Sacroiliac joint dysfunction 03/21/2015   CD (contact dermatitis) 03/23/2014   Foot pain 03/23/2014   Fracture, stress, metatarsal 03/23/2014    Past Surgical History:  Procedure Laterality Date   ABDOMINAL HYSTERECTOMY     APPENDECTOMY     BACK SURGERY     BREAST BIOPSY Left 2012   negative   CATARACT EXTRACTION, BILATERAL Bilateral 2005   COLONOSCOPY  2012   COLONOSCOPY WITH PROPOFOL  N/A 10/11/2015   Procedure: COLONOSCOPY WITH PROPOFOL ;  Surgeon: Rogelia Copping, MD;  Location: [facility];  Service: Endoscopy;  Laterality: N/A;   COLONOSCOPY WITH PROPOFOL  N/A 05/24/2021   Procedure: COLONOSCOPY WITH PROPOFOL ;  Surgeon: Dessa Reyes ORN, MD;  Location: [facility];  Service: Gastroenterology;  Laterality: N/A;   ESOPHAGOGASTRODUODENOSCOPY N/A 05/24/2021   Procedure: ESOPHAGOGASTRODUODENOSCOPY (EGD);  Surgeon: Dessa Reyes ORN, MD;  Location: [facility];  Service: Gastroenterology;  Laterality: N/A;   POLYPECTOMY  10/11/2015   Procedure: POLYPECTOMY;  Surgeon: Rogelia Copping, MD;  Location: [facility];  Service: Endoscopy;;   XI ROBOT ASSISTED RECTOPEXY N/A 10/09/2023   Procedure: XI ROBOT ASSISTED SUTURE RECTOPEXY;  Surgeon: Teresa Lonni HERO, MD;  Location: [facility];  Service: General;  Laterality: N/A;    OB History   No obstetric history on file.      Home Medications    Prior to Admission medications   Medication Sig Start Date End Date Taking? Authorizing Provider  atorvastatin  (LIPITOR) 20 MG tablet Take 20 mg by mouth at bedtime.    [provider]  busPIRone  (BUSPAR ) 5 MG tablet Take 5 mg by mouth 2 (two) times daily.    [provider]  cetirizine  (ZYRTEC ) 10 MG tablet Take 1 tablet by mouth twice daily Patient taking differently: Take 10 mg by mouth daily. 06/15/23   Claudene Lehmann, MD   esterified estrogens (MENEST) 0.625 MG tablet Take 0.625 mg by mouth daily.    [provider]  feeding supplement (ENSURE ENLIVE / ENSURE PLUS) LIQD Take 237 mLs by mouth 2 (two) times daily between meals. 01/26/24   Josette Ade, MD  hydrocortisone  2.5 % ointment Apply topically 2 (two) times daily. Apply to aa's rash BID PRN. 12/14/23   Claudene Lehmann, MD  Iron-Vitamins (GERITOL PO) Take 1 tablet by mouth daily.    [provider]  latanoprost  (XALATAN ) 0.005 % ophthalmic solution Place 1 drop into both eyes at bedtime.    [provider]  losartan  (COZAAR ) 100 MG tablet Take 100 mg by mouth daily. 09/09/23   [provider]  Multiple Vitamins-Minerals (ZINC  PO) Take 1 tablet by mouth daily.    [provider]  oxybutynin  (DITROPAN  XL) 15 MG 24 hr tablet Take 15 mg by mouth at bedtime. 11/03/19   [provider]  pantoprazole  (PROTONIX ) 40 MG tablet Take 40 mg by mouth daily.     [provider]  polyethylene glycol (MIRALAX  / GLYCOLAX ) 17 g packet Take 17 g by mouth daily. 10/09/23   Teresa Lonni HERO, MD  venlafaxine  XR (EFFEXOR -XR) 75 MG 24 hr capsule venlafaxine  ER 75 mg capsule,extended release 24 hr    [provider]    Family History Family History  Problem Relation Age of Onset   Arthritis Mother    Diabetes Mother    Depression Mother    COPD Mother    Heart disease Mother    Hypertension Mother    Alcohol abuse Father    Stroke Father    Stroke Paternal Aunt    Breast cancer Maternal Grandmother     Social History Social History   Tobacco Use   Smoking status: Some Days    Types: Cigarettes   Smokeless tobacco: Never  Vaping Use   Vaping status: Never Used  Substance Use Topics   Alcohol use: Not Currently    Alcohol/week: 3.0 standard drinks of alcohol    Types: 3 Glasses of wine per week   Drug use: No     Allergies   Gramineae pollens, Doxycycline, and Sulfa  antibiotics   Review of Systems Review of Systems: negative unless otherwise stated in HPI.      Physical Exam Triage Vital Signs ED Triage Vitals [04/20/24 1241]  Encounter Vitals Group     BP 100/60     Girls Systolic BP Percentile      Girls Diastolic BP Percentile      Boys Systolic BP Percentile      Boys Diastolic BP Percentile      Pulse Rate 79     Resp 16     Temp 98.5 F (36.9 C)     Temp Source Oral     SpO2 99 %  Weight      Height      Head Circumference      Peak Flow      Pain Score      Pain Loc      Pain Education      Exclude from Growth Chart    No data found.  Updated Vital Signs BP 100/60 (BP Location: Left Arm)   Pulse 79   Temp 98.5 F (36.9 C) (Oral)   Resp 16   SpO2 99%   Visual Acuity Right Eye Distance:   Left Eye Distance:   Bilateral Distance:    Right Eye Near:   Left Eye Near:    Bilateral Near:     Physical Exam GEN:     alert, non-toxic appearing female and no distress    HENT:  mucus membranes moist, oropharyngeal without lesions or erythema,  nares patent, no nasal discharge  EYES:   pupils equal and reactive, EOM intact RESP:  clear to auscultation bilaterally, no increased work of breathing  CVS:   regular rate and rhythm, murmur present  ABD:  soft, non-tender; bowel sounds present EXT:   normal ROM, atraumatic, no edema  NEURO:  alert, oriented, speech normal, sensation grossly intact, strength 5/5 bilateral UE and LE, normal coordination, no protonator drift, recalls address, DOB and who sister is, unable to recall seeing daughter yesterday, no oriented to time  Skin:   warm and dry, no rash     UC Treatments / Results  Labs (all labs ordered are listed, but only abnormal results are displayed) Labs Reviewed  GLUCOSE, CAPILLARY - Abnormal; Notable for the following components:      Result Value   Glucose-Capillary 185 (*)    All other components within normal limits    EKG  If EKG performed, see my  interpretation in the MDM section  Radiology No results found.   Procedures Procedures (including critical care time)  Medications Ordered in UC Medications - No data to display  Initial Impression / Assessment and Plan / UC Course  I have reviewed the triage vital signs and the nursing notes.  Pertinent labs & imaging results that were available during my care of the patient were reviewed by me and considered in my medical decision making (see chart for details).       Pt is a 78 y.o. female with history of CVA, HTN, Ulcerative colitis, depression, AAA, gastric ulcers. Pre-diabetes who presents for confusion for at least 24 hours.   Overall, pt is non-toxic appearing, afebrile, normotensive and satting well on room air.  On chart review, she had an MRI Brain on 01/25/24 that showed remote lacunar infarct of the lateral right thalamus.   CBG is somewhat elevated here at 185.  They ate just prior to arrival. EKG personally interpreted by me; NSR without acute ST or T wave changes; normal PR interval, no QT prolongation, grossly unchanged compared to prior EKG from 01/25/24.   Given her confusion recommended ED evaluation. Pt's sister requesting urinalysis. Explained to sister that Kamea needs a larger work up that is not available here. Sister reluctantly will take her to [facility] ED. Declined EMS transport.   Discussed MDM, treatment plan and plan for follow-up with patient and her sister.      Final Clinical Impressions(s) / UC Diagnoses   Final diagnoses:  Altered mental status, unspecified altered mental status type     Discharge Instructions      Your blood sugar  was 185.  Your EKG was normal. Your vitals were normal however you are confused with an unknown cause.  You have been advised to follow up immediately in the emergency department for concerning signs or symptoms as discussed during your visit. If you declined EMS transport, please have a family member  take you directly to the ED at this time. Do not delay.   Based on concerns about condition, if you do not follow up in the ED, you may risk poor outcomes including worsening of condition, delayed treatment and potentially life threatening issues. If you have declined to go to the ED at this time, you should call your PCP immediately to set up a follow up appointment.    ED Prescriptions   None    PDMP not reviewed this encounter.   Kriste Berth, DO 04/20/24 1335

## 2024-04-20 NOTE — ED Triage Notes (Signed)
 Sister states that she's been confused since yesterday that she knows of. Patient states several days. Sister states that patient had a UTI a few months ago and presented with confusion. Can't remember of any back or stomach pain.

## 2024-04-20 NOTE — ED Notes (Signed)
 Patient is being discharged from the Urgent Care and sent to the Emergency Department via POV . Per Dr.Brimage, patient is in need of higher level of care due to AMS. Patient is aware and verbalizes understanding of plan of care.  Vitals:   04/20/24 1241  BP: 100/60  Pulse: 79  Resp: 16  Temp: 98.5 F (36.9 C)  SpO2: 99%

## 2024-07-04 ENCOUNTER — Other Ambulatory Visit: Payer: Self-pay

## 2024-07-04 ENCOUNTER — Encounter (HOSPITAL_COMMUNITY): Payer: Self-pay

## 2024-07-04 ENCOUNTER — Emergency Department (HOSPITAL_COMMUNITY)
Admission: EM | Admit: 2024-07-04 | Discharge: 2024-07-04 | Disposition: A | Discharge status: Home / Self Care | Attending: Emergency Medicine | Admitting: Emergency Medicine

## 2024-07-04 ENCOUNTER — Emergency Department (HOSPITAL_COMMUNITY)

## 2024-07-04 DIAGNOSIS — M25561 Pain in right knee: Secondary | ICD-10-CM | POA: Insufficient documentation

## 2024-07-04 DIAGNOSIS — M25551 Pain in right hip: Secondary | ICD-10-CM

## 2024-07-04 DIAGNOSIS — M545 Low back pain, unspecified: Secondary | ICD-10-CM | POA: Diagnosis present

## 2024-07-04 MED ORDER — NAPROXEN 500 MG PO TABS: 500.0000 mg | ORAL_TABLET | Freq: Two times a day (BID) | ORAL | 0 refills | Status: DC

## 2024-07-04 NOTE — ED Provider Notes (Signed)
 [place] EMERGENCY DEPARTMENT AT [facility] Provider Note   CSN: [id number] Arrival date & time: 07/04/24  1117     Patient presents with: Hip Pain   Cheryl [name] is a 78 y.o. female.  78 year old female presents ED with complaints of right lower back pain x 1 week.  Patient reports she was putting her sheets on her bed at home when the pain began.  Since then it has gotten worse and she has tried 600 mg of ibuprofen  every 8 hours without relief.  Patient is still able to ambulate and stand.  Prior to the injury patient was fully ambulatory without pain.  Patient has significant history of osteoporosis.     Prior to Admission medications   Medication Sig Start Date End Date Taking? Authorizing Provider  naproxen  (NAPROSYN ) 500 MG tablet Take 1 tablet (500 mg total) by mouth 2 (two) times daily. 07/04/24  Yes Myriam Fonda RAMAN, PA-C  atorvastatin  (LIPITOR) 20 MG tablet Take 20 mg by mouth at bedtime.    [provider]  busPIRone  (BUSPAR ) 5 MG tablet Take 5 mg by mouth 2 (two) times daily.    [provider]  cetirizine  (ZYRTEC ) 10 MG tablet Take 1 tablet by mouth twice daily Patient taking differently: Take 10 mg by mouth daily. 06/15/23   Claudene Lehmann, MD  esterified estrogens (MENEST) 0.625 MG tablet Take 0.625 mg by mouth daily.    [provider]  feeding supplement (ENSURE ENLIVE / ENSURE PLUS) LIQD Take 237 mLs by mouth 2 (two) times daily between meals. 01/26/24   Josette Ade, MD  hydrocortisone  2.5 % ointment Apply topically 2 (two) times daily. Apply to aa's rash BID PRN. 12/14/23   Claudene Lehmann, MD  Iron-Vitamins (GERITOL PO) Take 1 tablet by mouth daily.    [provider]  latanoprost  (XALATAN ) 0.005 % ophthalmic solution Place 1 drop into both eyes at bedtime.    [provider]  losartan  (COZAAR ) 100 MG tablet Take 100 mg by mouth daily. 09/09/23   [provider]  Multiple Vitamins-Minerals  (ZINC  PO) Take 1 tablet by mouth daily.    [provider]  oxybutynin  (DITROPAN  XL) 15 MG 24 hr tablet Take 15 mg by mouth at bedtime. 11/03/19   [provider]  pantoprazole  (PROTONIX ) 40 MG tablet Take 40 mg by mouth daily.     [provider]  polyethylene glycol (MIRALAX  / GLYCOLAX ) 17 g packet Take 17 g by mouth daily. 10/09/23   Teresa Lonni HERO, MD  venlafaxine  XR (EFFEXOR -XR) 75 MG 24 hr capsule venlafaxine  ER 75 mg capsule,extended release 24 hr    [provider]    Allergies: Gramineae pollens, Doxycycline, and Sulfa antibiotics    Review of Systems  Musculoskeletal:  Positive for arthralgias and myalgias.  All other systems reviewed and are negative.   Updated Vital Signs BP 129/71   Pulse 65   Temp 97.8 F (36.6 C) (Oral)   Resp 17   SpO2 100%   Physical Exam Vitals and nursing note reviewed.  Constitutional:      Appearance: Normal appearance.  HENT:     Head: Normocephalic and atraumatic.     Nose: Nose normal.  Eyes:     Extraocular Movements: Extraocular movements intact.     Conjunctiva/sclera: Conjunctivae normal.     Pupils: Pupils are equal, round, and reactive to light.  Cardiovascular:     Rate and Rhythm: Normal rate.  Pulmonary:  Effort: Pulmonary effort is normal. No respiratory distress.  Musculoskeletal:        General: Tenderness present. No deformity or signs of injury. Normal range of motion.     Cervical back: Normal and normal range of motion.     Thoracic back: Normal. Normal range of motion.     Lumbar back: Tenderness present. Normal range of motion. Negative right straight leg raise test and negative left straight leg raise test.     Comments: Tenderness to the right buttock  Skin:    General: Skin is warm and dry.  Neurological:     General: No focal deficit present.     Mental Status: She is alert.  Psychiatric:        Mood and Affect: Mood normal.        Behavior: Behavior normal.      (all labs ordered are listed, but only abnormal results are displayed) Labs Reviewed - No data to display  EKG: None  Radiology: DG Hip Unilat W or Wo Pelvis 2-3 Views Right Result Date: 07/04/2024 CLINICAL DATA:  Right hip pain. EXAM: DG HIP (WITH OR WITHOUT PELVIS) 2-3V RIGHT COMPARISON:  CT angiography abdomen and pelvis from 07/02/2023. FINDINGS: There are old healed fracture deformities of bilateral superior pubic rami reaching up to the symphysis pubis. No acute fracture or dislocation. No aggressive osseous lesion. Visualized sacral arcuate lines are unremarkable. Unremarkable bilateral hip joints. No radiopaque foreign bodies. IMPRESSION: No acute osseous abnormality of the right hip joint. Electronically Signed   By: Ree Molt M.D.   On: 07/04/2024 13:58     Procedures   Medications Ordered in the ED - No data to display  78 y.o. female presents to the ED with complaints of right hip pain., this involves an extensive number of treatment options, and is a complaint that carries with it a high risk of complications and morbidity.  The differential diagnosis includes fracture, muscle strain, dislocation, arthritis (Ddx)  On arrival pt is nontoxic, vitals unremarkable. Exam significant for lower back pain and some mild pain to palpation.   Imaging Studies ordered:  I ordered imaging studies which included hip x-ray, I independently visualized and interpreted imaging which showed no acute abnormalities  ED Course:   Patient sitting comfortably in wheelchair in ED room.  Patient has no other complaints and appears in no distress.  Patient has no swelling, erythema, or loss of range of motion.  Patient has complaint of right lower back pain.  Patient reports the pain has been getting worse and ibuprofen  no longer helps.  Patient has pain to palpation in her lower back normal and is able to move her leg without assistance.  Patient has a negative straight leg raise.  Patient is  able to stand without assistance and walk.  Patient advises after few steps she starts to have pain in her lower back.  Patient reports that she has not seen her primary care and it was advised to follow-up with them for further recommendation.  Patient is still able to ambulate and stand without assistance and was advised further evaluation to be done with primary care or orthopedics.  Patient agreed with treatment plan and was discharged.   Portions of this note were generated with Scientist, clinical (histocompatibility and immunogenetics). Dictation errors may occur despite best attempts at proofreading.    Final diagnoses:  Right hip pain    ED Discharge Orders          Ordered  naproxen  (NAPROSYN ) 500 MG tablet  2 times daily        07/04/24 1420               Myriam Fonda GORMAN DEVONNA 07/04/24 2157    Suzette Pac, MD 07/07/24 1118

## 2024-07-04 NOTE — ED Triage Notes (Signed)
 Pt c/o right hip pain for the past week. Pt states she was changing her sheets and stretching across the bed and since then has been having pain in her right hip. She has been taking tylenol  and ibuprofen  w/o relief. Lots of pain walking and moving.

## 2024-07-04 NOTE — Discharge Instructions (Signed)
 Follow-up with primary care for further evaluation of symptoms.  Your x-rays were reassuring today.  Take naproxen  as needed for pain and try to stretch the area daily.  If symptoms worsen or new symptom occur return to ED for further evaluation.

## 2024-08-02 NOTE — ED Provider Notes (Signed)
 [facility] Emergency Department Provider Note   HPI   Cheryl [name] is a very pleasant 78 y.o. female with a past medical history of HTN and osteoporosis presenting with altered mental status. The patient reports essential tremors to her R side since waking up from a nap today that has since been constant, only abating for several seconds at a time before restarting, as well as increased confusion in the setting of moderate dementia at baseline. She lives with her daughter at home, who states that her confusion has been getting worse especially after recent recurrent UTIs. She was tearful and did not know who she was earlier today, although her mental status has improved on presentation. She denies any history of tremors or seizures, although she did have a stroke 4-5 years ago. She does not take targeted medications for any movement disorders, although she was recently started on donepezil for her worsening dementia approximately 3 months ago. She has had a foley catheter placed for the past month and has an appointment in 2 days for reevaluation of her catheter needs. She denies chest pain, shortness of breath, nausea, or emesis.  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  MDM   BP 187/96   Pulse 79   Temp 36.7 C (98.1 F) (Oral)   Resp 20   SpO2 96%    Hemodynamically stable. On exam, patient is overall well-appearing in no acute distress. Focal shaking to the RUE.  Regular rate and rhythm, no increased work of breathing abdomen nondistended 2+ pulses in distal extremities, no leg swelling.  Ddx: Patient with transient alteration in awareness, and right arm shaking, unclear etiology, could consider focal neurologic disorder, sequela of her previous stroke, medication realities versus infectious etiologies, I have a lower suspicion of acute stroke at this time or acute intracranial abnormality,  however if she does not have other organic cause of her tremor, would plan for advanced imaging.  Diagnostic workup as below. Disposition pending further work-up. See progress notes below.   Orders Placed This Encounter  Procedures  . Blood Culture #1  . Blood Culture #2  . Urine Culture  . CT Head Wo Contrast  . XR Chest 2 views  . CBC w/ Differential  . Lactate Sepsis  . Comprehensive Metabolic Panel  . Urinalysis with Microscopy with Culture Reflex  . Blood Gas Critical Care Panel, Venous  . Extra Lab Tubes  . Sepsis Order Set Placed  . Notify Provider  . Obtain previous EKG  . ECG 12 Lead  . Fall precautions    ED Course as of 08/02/24 2222  Tue Aug 02, 2024  1909 Lactate, Venous(!): 2.1  1909 CBC without actionable findings  1909 pH, Venous(!): 7.44  2051 CMP without actionable findings  2051 UA without signs of infection  2147 XR Chest 2 views   IMPRESSION:   No acute cardiopulmonary abnormalities. No adverse interval change.   2216 Reviewed previous MRI, with right thalamic infarct, which could be contributing to her presentation of arm shaking today.  Will attempt additional symptom control, discussed transfer to [facility] for MRI versus boarding in the emergency room for MRI in the a.m. versus discharge with neurology follow-up, lister decision making, patient has opted to stay in the emergency department and board until the a.m. for MRI.  Will attempt further medications for symptom control.  2222 At the time  of signout, patient is stable, pending MRI in the a.m.    The case was discussed with the attending physician who is in agreement with the above assessment and plan.  - Any discussion of this patient's case/presentation between myself and consultants, admitting teams, or other team members has been documented above. - Imaging and other studies: N/A - External records reviewed: External ED note - 07/04/2024 [facility] ED for [facility] - Consideration of  admission, observation, transfer, or escalation of care: At the time of signout, patient's disposition is unclear, please see ED progress note for further clinical reasoning and disposition.   ED Clinical Impression   Final diagnoses:  Transient alteration of awareness (Primary)  Tremor     Physical Exam   Vitals:   08/02/24 1821 08/02/24 2032 08/02/24 2127  BP: 204/112 198/94 187/96  Pulse: 104 79   Resp: 22 20   Temp: 36.7 C (98.1 F) 36.7 C (98.1 F)   TempSrc: Temporal Oral   SpO2: 97% 97% 96%     Constitutional: In no acute distress. Eyes: Conjunctivae are normal. EOMI.  HEENT: Normocephalic and atraumatic. Mucous membranes are moist.  Neck: Full active range of motion Cardiovascular: See heart rate listed above.  Normal skin perfusion.  2+ pulses in distal extremities, no lower extremity edema.  Respiratory: See respiratory rate listed above.   Speaking easily in full sentences Gastrointestinal: Soft, non-distended, non-tender.  No guarding. Musculoskeletal: No long bone deformities.  Neurologic: Normal speech and language. Focal shaking to the RUE.  Skin: Skin is warm, dry and intact. Psychiatric: Mood and affect are normal.   Past History   PAST MEDICAL HISTORY/PAST SURGICAL HISTORY:  Past Medical History[1]  Past Surgical History[2]  MEDICATIONS:   Current Facility-Administered Medications:  .  lactated ringers  bolus 1,000 mL, 1,000 mL, Intravenous, Once, Alain Morene Coy Sam, DO, 1,000 mL at 08/02/24 2131  Current Outpatient Medications:  .  albuterol  HFA 90 mcg/actuation inhaler, INHALE 2 PUFFS BY MOUTH EVERY 4 TO 6 HOURS AS NEEDED FOR WHEEZING, Disp: , Rfl:  .  atorvastatin  (LIPITOR) 20 MG tablet, Take 1 tablet (20 mg total) by mouth., Disp: , Rfl:  .  buPROPion (WELLBUTRIN XL) 150 MG 24 hr tablet, Take 1 tablet (150 mg total) by mouth in the morning., Disp: , Rfl:  .  busPIRone  (BUSPAR ) 5 MG tablet, , Disp: , Rfl:  .  calcium  carbonate-vitamin  D3 600 mg(1,500mg ) -200 unit per tablet, Take 1 tablet by mouth., Disp: , Rfl:  .  cetirizine  (ZYRTEC ) 10 MG tablet, Take 1 tablet by mouth once a day as needed for allergies, Disp: , Rfl:  .  clobetasol (TEMOVATE) 0.05 % ointment, Apply the medication twice daily to most severely affected areas of the skin until smooth and flat. Then stop and re-start if needed. Avoid use on the face., Disp: 60 g, Rfl: 5 .  cyclobenzaprine (FLEXERIL) 5 MG tablet, Take 1 tablet (5 mg total) by mouth Three (3) times a day., Disp: , Rfl:  .  docusate sodium (COLACE) 100 MG capsule, Take 1 capsule (100 mg total) by mouth two (2) times a day., Disp: , Rfl:  .  esterified estrogens (MENEST) 0.625 MG tablet, Take 1 tablet (0.625 mg total) by mouth daily., Disp: , Rfl:  .  folic acid (FOLVITE) 1 MG tablet, Take 1 tablet (1 mg total) by mouth daily. Take on all non-methotrexate days, Disp: 30 tablet, Rfl: 11 .  ketoconazole (NIZORAL) 2 % cream, Apply 0.25 grams  to affected areas and 1 inch around twice daily, Disp: 30 g, Rfl: 5 .  losartan  (COZAAR ) 100 MG tablet, Take by mouth., Disp: , Rfl:  .  losartan -hydrochlorothiazide (HYZAAR) 100-25 mg per tablet, , Disp: , Rfl:  .  meclizine (ANTIVERT) 25 mg tablet, Take 1 tablet (25 mg total) by mouth., Disp: , Rfl:  .  methotrexate 2.5 MG tablet, Take 4 tablets (10 mg total) by mouth once a week., Disp: 20 tablet, Rfl: 2 .  metroNIDAZOLE (METROCREAM) 0.75 % cream, Apply topically for rash twice daily., Disp: 45 g, Rfl: 11 .  nystatin (MYCOSTATIN) 100,000 unit/gram cream, , Disp: , Rfl:  .  nystatin (MYCOSTATIN) 100,000 unit/mL suspension, SWISH AND SPIT USING BY MOUTH FOUR TIMES DAILY. CONTINUE FOR TWO DAYS AFTER SYMPTOMS ARE GONE, Disp: , Rfl:  .  oxybutynin  (DITROPAN  XL) 15 MG 24 hr tablet, , Disp: , Rfl:  .  pantoprazole  (PROTONIX ) 40 MG tablet, Take 1 tablet (40 mg total) by mouth daily., Disp: 90 tablet, Rfl: 0 .  permethrin  (ELIMITE ) 5 % cream, APPLY EVERYWHERE FROM THE  NECK DOWN. LET SIT OVERNIGHT AND WASH OFF IN THE MORNING. REPEAT IN ONE WEEK, Disp: , Rfl:  .  predniSONE  (DELTASONE ) 10 MG tablet, Take 2 tablets (20 mg total) by mouth daily., Disp: 60 tablet, Rfl: 0 .  SIMBRINZA 1-0.2 % DrpS, INSTILL 1 DROP INTO EACH EYE THREE TIMES DAILY, Disp: , Rfl:  .  timolol (TIMOPTIC) 0.5 % ophthalmic solution, INSTILL 1 DROP INTO EACH EYE AT BEDTIME, Disp: , Rfl:  .  triamcinolone  (KENALOG ) 0.1 % cream, Apply topically two (2) times a day. For rash: Apply twice per day every day to rough and raised spots. Stop using when they are soft and smooth. You can start using again if you  need to., Disp: 80 g, Rfl: 2 .  triamcinolone  (KENALOG ) 0.1 % ointment, Apply the medication twice daily (5 grams) to affected areas of the skin until smooth., Disp: 454 g, Rfl: 5 .  valACYclovir (VALTREX) 1000 MG tablet, Take one tablet three times daily for 7 days., Disp: 21 tablet, Rfl: 0 .  valACYclovir (VALTREX) 1000 MG tablet, Take one tablet by mouth twice daily for 7 days., Disp: 14 tablet, Rfl: 0 .  venlafaxine  (EFFEXOR -XR) 75 MG 24 hr capsule, TAKE 1 CAPSULE BY MOUTH ONCE DAILY FOR DEPRESSION, Disp: , Rfl:  .  ZINC  ACETATE TOP, Apply topically., Disp: , Rfl:   ALLERGIES:  Cephalexin ; Grass pollen-june grass standard; Grass pollen-red top, standard; Ceftriaxone ; Doxycycline; Hydroxychloroquine; Sulfa (sulfonamide antibiotics); and Sulfamethoxazole-trimethoprim  SOCIAL HISTORY:  Social History   Tobacco Use  . Smoking status: Some Days    Types: Cigarettes  . Smokeless tobacco: Never  Substance Use Topics  . Alcohol use: Yes    FAMILY HISTORY: Family History[3]    Radiology   XR Chest 2 views  Final Result    No acute cardiopulmonary abnormalities. No adverse interval change.          CT Head Wo Contrast  Final Result  No evidence of an acute intracranial process aside from trace sinusitis. Chronic microangiopathy. No adverse interval change.         Laboratory  Data   Lab Results  Component Value Date   WBC 8.1 08/02/2024   HGB 13.4 08/02/2024   HCT 39.6 08/02/2024   PLT 265 08/02/2024    Lab Results  Component Value Date   NA 145 08/02/2024   NA 144 08/02/2024  K 3.3 (L) 08/02/2024   K 3.3 (L) 08/02/2024   CL 101 08/02/2024   CO2 26.5 08/02/2024   BUN 12 08/02/2024   CREATININE 0.53 (L) 08/02/2024   GLU 104 08/02/2024   CALCIUM  10.0 08/02/2024    Lab Results  Component Value Date   BILITOT 0.4 08/02/2024   BILIDIR 0.10 06/28/2023   PROT 7.8 08/02/2024   ALBUMIN 3.9 08/02/2024   ALT 20 08/02/2024   AST 24 08/02/2024   ALKPHOS 108 08/02/2024    No results found for: LABPROT, INR, APTT  Morene Ates, DO PGY3 EM  Portions of this record have been created using Scientist, clinical (histocompatibility and immunogenetics). Dictation errors have been sought, but may not have been identified and corrected.  Documentation assistance was provided by Venetia Kuba, Scribe, on August 02, 2024 at 7:17 PM for Morene Ates, DO  Documentation assistance was provided by the scribe in my presence.  The documentation recorded by the scribe has been reviewed by me and accurately reflects the services I personally performed. Edits were made as necessary.  Morene Ates, DO       [1] Past Medical History: Diagnosis Date  . Acid reflux   . Depression   . Hypertension   . Osteoporosis   [2] Past Surgical History: Procedure Laterality Date  . HYSTERECTOMY    . OOPHORECTOMY    . SKIN BIOPSY    [3] Family History Problem Relation Age of Onset  . Melanoma Paternal Uncle   . Basal cell carcinoma Neg Hx   . Squamous cell carcinoma Neg Hx    Ates Morene Maude Sheppard, DO Resident 08/02/24 2222

## 2024-08-02 NOTE — ED Triage Notes (Addendum)
 Pt presents with daughter, who is historian. Daughter reports deteriorating mental status and ability to be independent. Recent initiation of donepezil for dementia. Pt has had a foley catheter for 1 month.  Pt is tearful and shaking in triage.

## 2024-08-03 NOTE — Telephone Encounter (Addendum)
  Emergency Medicine Access Physician [facility]) [facility] - Transfer Request Note  Requesting Provider: Dr. Fredia  Requesting Hospital: [facility]  Requesting Service: Emergency Medicine  Reason for transfer request: Neurology evaluation for choreiform movements  Overview of ED course at transferring hospital:   This is a 78 year old female with a history of dementia and a remote stroke who requires in-person neurology consultation. The patient presented to [facility] ED for new-onset, high-amplitude, choreiform movements of her right arm, accompanied by an acute worsening of her baseline confusion. Per the sending physician, the movements required multiple doses of Ativan PO and Benadryl  PO for symptomatic control.  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 Dr. Fredia consulted neurology, who declined to provide recommendations without an in-person physical examination. 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   Given the patient's persistent symptoms and the sending facility's inability to obtain a definitive neurology consultation, I accepted the patient in transfer to our emergency department for further evaluation by our neurology service to determine the etiology of her movement disorder and guide further management.  Was the patient accepted to the [facility] ED in transfer: Yes.  Accepting service/expected service information: Has an inpatient or consulting service been contacted by the [facility], [facility] or the referring provider?: Yes. If admitting/consult service contacted by [facility]: Time of discussion: Documented above in overview section Name/service of inpatient team member/consultant: Documented above in overview section Role of admit/consult  service member: Documented above in overview section Brief summary of call: Documented above in overview section Anticipated Inpatient Bed Type Needed: To be determined  Outside Hospital Imaging: Was Imaging Done at the Referring Hospital:  Yes. If yes, what studies?: MRI, MRA [employer] [facility]: [email or web address]

## 2024-08-03 NOTE — ED Provider Notes (Signed)
 [facility] Emergency Department Progress Note  August 03, 2024 8:02 AM     ED care from Dr. Zachary at 7:00 AM. Cheryl [name] is a 78 y.o. female with a past medical history of dementia, HTN, lumbar radiculopathy with indwelling foley for past month, CVA noted incidentally on MRI 5 years ago with but has had left eye visual impairment since then, and osteoporosis presenting to the ED for RUE essential tremors that started after waking up from a nap yesterday as well as increased confusion in the setting of dementia at baseline.    Medical Decision Making and Progress Notes   On exam the patient is non-toxic appearing and in NAD. Vital signs are notable for hypertension to 179/108 and hypoxia to 93%, but are otherwise within normal limits, hemodynamically stable, and afebrile. LCTAB. HRRR. Physical exam is notable for shaking to the RUE.   CMP is notable for hypokalemia to 3.3, elevated anion gap to 18, and decreased creatinine to 0.53. UA is notable for trace leukocyte esterase, trace protein, and 7 WBC and culture sent. Lactate venous is elevated to 2.1. VBG is notable for elevated pH to 7.44, decreased pO2 to 27, BE elevated to 3.7, hypokalemia to 3.3, and elevated lactate to 2.1. CBC WNL and reassuring.   CXR is reassuring without evidence of acute cardiopulmonary abnormalities. CT Head Wo Contrast is reassuring without evidence of acute intracranial process. There is notable trace sinusitis and chronic microangiopathy without adverse interval change.    Pending MRI work up at sign out.    Discussion of Management with other Physicians, QHP or Appropriate Source: Neurology Independent Interpretation of Studies: Brain MRI/MRA External Records Reviewed: Patient's most recent outside Emergency Department [facility]25[facility] Provider Note- For review of the patient's past medical history.  Escalation of Care, Consideration of Admission/Observation/Transfer:  Transfer to Main Social determinants that significantly affected care: None applicable Prescription drug(s) considered but not prescribed: N/A Diagnostic tests considered but not performed: N/A History obtained from other sources: Family  Additional Progress Notes and Critical Care  Patient has been resting. 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 Received ativan 0.5mg  three times and benadryl  25mg  once overnight and now no symptoms. Last ativan at 0500.  D/W Neurology who suspects possible seizure but cannot evaluate without seeing patient.   D>W Vascular who feels subclavian steal can be followed outpatient.  1430 Patient reexamined and is resting.  Does have a slight right arm tremor.  Daughter states she is very concerned patient is not at her baseline.  Plan to transfer to main campus for neurology evaluation.  Dr. Rojelio has accepted.  Portions of this record have been created using Scientist, clinical (histocompatibility and immunogenetics). Dictation errors have been sought, but may not have been identified and corrected.    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     No other significant extracranial stenoses.            MRI Head W Wo Contrast MRA Head Wo Contrast  Final Result  No acute intracranial abnormality.    No large vessel occlusion or high-grade stenosis of the intracranial circulation.    Right ICA cavernous segment inferiorly directed 2 mm outpouching, possible tiny aneurysm versus infundibulum.      XR Chest 2 views  Final Result    No acute cardiopulmonary abnormalities. No adverse interval change.          CT Head Wo Contrast  Final Result  No evidence of an acute intracranial process aside from trace sinusitis. Chronic microangiopathy. No adverse  interval change.         Final diagnoses:  Transient alteration of awareness (Primary)  Tremor   Documentation assistance was provided by Vernell Derry, Scribe on August 03, 2024 at 8:05 AM for Rosette Dais, MD. August 03, 2024 2:41 PM. Documentation assistance provided by the above mentioned scribe. I was present during the time the encounter was recorded. The information recorded by the scribe was done at my direction and has been reviewed and validated by me.

## 2024-08-04 ENCOUNTER — Ambulatory Visit: Admitting: Physician Assistant

## 2024-08-04 ENCOUNTER — Encounter: Payer: Self-pay | Admitting: Physician Assistant

## 2024-08-04 ENCOUNTER — Ambulatory Visit (INDEPENDENT_AMBULATORY_CARE_PROVIDER_SITE_OTHER): Admitting: Physician Assistant

## 2024-08-04 VITALS — Ht 60.0 in | Wt 103.0 lb

## 2024-08-04 DIAGNOSIS — R339 Retention of urine, unspecified: Secondary | ICD-10-CM

## 2024-08-04 LAB — BLADDER SCAN AMB NON-IMAGING

## 2024-08-04 NOTE — Procedures (Signed)
 FINAL LONGTERM VIDEO EEG MONITORING REPORT  Identifying Information  NAME: Cheryl [name]   MRN: [id number]  DOB: 05-Jan-1946   LOC: 09-A/09-A  HISTORY: 78 y.o. female with history of dementia, HTN, remote right thalamic ischemic stroke, lumbar radiculopathy, osteoporosis for whom EEG was placed to evaluate RUE tremors.  INDICATION:  Evaluate for Electrographic Seizures  PATIENT STATE: Awake and Drowsy  Study Information EEG Start: August 03, 2024 at 22:07 EEG End: August 04, 2024 at 1:38  TECHNICAL DESCRIPTION  Conditions of Recording:  Continuous EEG with simultaneous video recording was performed utilizing 21 active electrodes placed according to the international 10-20 system.  The study was recorded digitally with a bandpass of 1-70Hz  and a sampling rate of 200Hz  and was reviewed with the possibility of multiple reformatting.  The study was digitally processed with potential spike and seizure events identified for physician analysis and review.  Patient recognized events were identified by a push button marker and reviewed by the physician.   Simultaneous video was reviewed for all patient events.  EEG DESCRIPTION   Background: The most awake background was continuous and consisted of predominantly theta and alpha frequency waveforms. There was retained organization with anterior to posterior gradient. There was an 8 Hz posterior dominant rhythm bilaterally. State changes were seen, without well-formed N2 sleep transients.   Interictal Epileptiform Activity: There were no epileptiform abnormalities or periodic patterns noted.  Ictal Activity: There were no ictal patterns noted.  Events: There were three push button events for right upper extremity tremor (23:58, 00:38 and 00:45). On video, there was a course  RUE tremor of the hand which appeared to be present both at rest and with motion. Tremor persisted when family picked up the patient's right hand 00:38 on 08/04/24. This  did not have an electrographic correlate on EEG.  DAY 1 EEG INTERPRETATION: 08/03/24  at 22:07 to August 04, 2024 at 1:38 Megan Maxon, MD)  Relevant Medications: PO ativan 0.5 mg, given at 20:14, 22:17, and 05:15  Abnormal EEG due to: Diffuse background slowing    CLINICAL CORRELATION / SUMMARY of RECORDING:  These findings are consistent with mild generalized background slowing, demonstrative of a mild degree of encephalopathy which is nonspecific for etiology.  No seizures or epileptiform discharges. There was no electrographic correlate to habitual episodes of right upper extremity shaking captured at 23:58, 00:38 and 00:45.    Interpreting Physician: Darlina Maxon, MD   [id number] Seizure Risk Score Clinical risk score based on EEG findings and clinical history of seizures to aid in determination of optimal duration of EEG monitoring for detection of electrographic seizures. Score has not been validated in patients under age 65 or following cardiac arrest.  NOT APPLICABLE FOR THIS PATIENT   Add 1 point if known history of epilepsy or prior clinical seizure.   Risk Group   Seizure Risk  at 72 hours  Duration of Monitoring   Seizure risk < 5%   Duration of Monitoring   Seizure risk < 2%    Low Risk,  Score = 0   3.1%  1 Hour  3.3 Hours   Medium Risk,  Score = 1   12%  12 Hours  29 Hours   High Risk,  Score = 2 or greater              >25%  >24 Hours  >30 Hours  Struck et al JAMA Neurology, January 2020  Not appropriate for EEG monitoring being performed for the following:  Treatment of status epilepticus or seizures already documented on EEG Monitoring sedation/ burst suppression for management of intracranial pressure and/or paralyzed patients Diagnostic evaluation of transient episodes concerning for possible seizures (spell capture)  Patients s/p cardiac arrest undergoing targeted temperature management    Amon dunker al. American Clinical  Neurophysiology Society's Standardized Critical Care EEG Terminology: 2021 Version. Journal of Clinical Neurophysiology 38(1):p 1-29, January 2021.     Attending Attestation   I have reviewed this EEG recording in its entirety, discussed the findings with the fellow and have reviewed and edited the fellow's EEG report above for completion and accuracy   Geni JONETTA Louder, MD

## 2024-08-04 NOTE — Progress Notes (Unsigned)
 08/04/2024 4:25 PM   Cheryl [name], Cheryl [name] [id number]  CC: Chief Complaint  Patient presents with   Urinary Retention   HPI: Cheryl [name] is a 78 y.o. female with PMH dementia, remote CVA, prediabetes, new abnormal RUE movements and worsening baseline confusion pending neurology workup, and a recent history of urinary retention in the setting of right hip pain and acute cystitis requiring Foley catheter placement at [facility] on 07/11/2024 who presents today as a new patient for evaluation of urinary retention.  She is accompanied today by her daughter, who contributes to HPI.  There was 1500 mL urinary drainage [facility].  Urine culture grew E. coli and Citrobacter freundii.  No susceptibility testing performed.  She was treated with Cipro.  Foley catheter was removed in the emergency department overnight.  Today she reports she has been able to void without difficulty or dysuria.  She denies gross hematuria.  No prior urology visits.  Notably, she was on oxybutynin  XL 15 mg nightly chronically, but this was stopped at the time of catheter insertion.  Her daughter reports she has had a significant decline in her functional and cognitive status over the past 6 months.  She is now living with her daughter.  In-office UA today positive for trace leukocytes; urine microscopy with 6-10 WBCs/HPF, >10 epithelial cells/hpf, and many bacteria.  PVR 0 mL.  PMH: Past Medical History:  Diagnosis Date   Abdominal aneurysm    Allergy    seasonal   Anxiety    Arthritis    hips   Cataract    Complication of anesthesia    facial swelling after hysterectomy   Depression    Gastric ulcer 10/27/2010   Hypertension    Hypokalemia    Hyponatremia    Lymphocytic colitis    Osteopenia    Peptic ulcer    Piriformis syndrome    Pneumonia    Pre-diabetes    Radiculopathy of lumbar region    Ulcerative colitis (HCC)    Vertigo    with sinus issues   Wears  dentures    partial upper    Surgical History: Past Surgical History:  Procedure Laterality Date   ABDOMINAL HYSTERECTOMY     APPENDECTOMY     BACK SURGERY     BREAST BIOPSY Left 2012   negative   CATARACT EXTRACTION, BILATERAL Bilateral 2005   COLONOSCOPY  2012   COLONOSCOPY WITH PROPOFOL  N/A 10/11/2015   Procedure: COLONOSCOPY WITH PROPOFOL ;  Surgeon: Rogelia Copping, MD;  Location: [facility];  Service: Endoscopy;  Laterality: N/A;   COLONOSCOPY WITH PROPOFOL  N/A 07/[name]/2022   Procedure: COLONOSCOPY WITH PROPOFOL ;  Surgeon: Dessa Reyes ORN, MD;  Location: [facility] ENDOSCOPY;  Service: Gastroenterology;  Laterality: N/A;   ESOPHAGOGASTRODUODENOSCOPY N/A 07/[name]/2022   Procedure: ESOPHAGOGASTRODUODENOSCOPY (EGD);  Surgeon: Dessa Reyes ORN, MD;  Location: [facility] END[facility];  Service: Gastroenterology;  Laterality: N/A;   POLYPECTOMY  10/11/2015   Procedure: POLYPECTOMY;  Surgeon: Rogelia Copping, MD;  Location: [facility];  Service: Endoscopy;;   XI ROBOT ASSISTED RECTOPEXY N/A 10/09/2023   Procedure: XI ROBOT ASSISTED SUTURE RECTOPEXY;  Surgeon: Teresa Lonni HERO, MD;  Location: [facility];  Service: General;  Laterality: N/A;    Home Medications:  Allergies as of 08/04/2024       Reactions   Ceftriaxone  Dermatitis   Cephalexin  Dermatitis   Gramineae Pollens Other (See Comments)   Doxycycline Nausea Only, Palpitations   Shaky   Hydroxychloroquine Dermatitis  Sulfa Antibiotics Nausea Only   Shaking         Medication List        Accurate as of August 04, 2024  4:25 PM. If you have any questions, ask your nurse or doctor.          atorvastatin  20 MG tablet Commonly known as: LIPITOR Take 20 mg by mouth at bedtime.   busPIRone  5 MG tablet Commonly known as: BUSPAR  Take 5 mg by mouth 2 (two) times daily.   cetirizine  10 MG tablet Commonly known as: ZYRTEC  Take 1 tablet by mouth twice daily What changed: when to take this   esterified estrogens 0.625  MG tablet Commonly known as: MENEST Take 0.625 mg by mouth daily.   feeding supplement Liqd Take 237 mLs by mouth 2 (two) times daily between meals.   GERITOL PO Take 1 tablet by mouth daily.   hydrocortisone  2.5 % ointment Apply topically 2 (two) times daily. Apply to aa's rash BID PRN.   latanoprost  0.005 % ophthalmic solution Commonly known as: XALATAN  Place 1 drop into both eyes at bedtime.   losartan  100 MG tablet Commonly known as: COZAAR  Take 100 mg by mouth daily.   naproxen  500 MG tablet Commonly known as: NAPROSYN  Take 1 tablet (500 mg total) by mouth 2 (two) times daily.   oxybutynin  15 MG 24 hr tablet Commonly known as: DITROPAN  XL Take 15 mg by mouth at bedtime.   pantoprazole  40 MG tablet Commonly known as: PROTONIX  Take 40 mg by mouth daily.   polyethylene glycol 17 g packet Commonly known as: MIRALAX  / GLYCOLAX  Take 17 g by mouth daily.   venlafaxine  XR 75 MG 24 hr capsule Commonly known as: EFFEXOR -XR venlafaxine  ER 75 mg capsule,extended release 24 hr   ZINC  PO Take 1 tablet by mouth daily.        Allergies:  Allergies  Allergen Reactions   Ceftriaxone  Dermatitis   Cephalexin  Dermatitis   Gramineae Pollens Other (See Comments)   Doxycycline Nausea Only and Palpitations    Shaky   Hydroxychloroquine Dermatitis   Sulfa Antibiotics Nausea Only    Shaking     Family History: Family History  Problem Relation Age of Onset   Arthritis Mother    Diabetes Mother    Depression Mother    COPD Mother    Heart disease Mother    Hypertension Mother    Alcohol abuse Father    Stroke Father    Stroke Paternal Aunt    Breast cancer Maternal Grandmother     Social History:   reports that she has been smoking cigarettes. She has never used smokeless tobacco. She reports that she does not currently use alcohol after a past usage of about 3.0 standard drinks of alcohol per week. She reports that she does not use drugs.  Physical Exam: Ht 5'  (1.524 m)   Wt 103 lb (46.7 kg)   BMI 20.12 kg/m   Constitutional:  Alert, no acute distress, nontoxic appearing HEENT: [place], AT Cardiovascular: No clubbing, cyanosis, or edema Respiratory: Normal respiratory effort, no increased work of breathing Skin: No rashes, bruises or suspicious lesions Neurologic: Right upper extremity tremor Psychiatric: Normal mood and affect  Laboratory Data: Results for orders placed or performed in visit on 08/04/24  Microscopic Examination   Collection Time: 08/04/24  4:11 PM   Urine  Result Value Ref Range   WBC, UA 6-10 (A) 0 - 5 /hpf   RBC, Urine 0-2 0 -  2 /hpf   Epithelial Cells (non renal) >10 (A) 0 - 10 /hpf   Mucus, UA Present (A) Not Estab.   Bacteria, UA Many (A) None seen/Few  Urinalysis, Complete   Collection Time: 08/04/24  4:11 PM  Result Value Ref Range   Specific Gravity, UA 1.010 1.005 - 1.030   pH, UA 6.0 5.0 - 7.5   Color, UA Yellow Yellow   Appearance Ur Clear Clear   Leukocytes,UA Trace (A) Negative   Protein,UA Negative Negative/Trace   Glucose, UA Negative Negative   Ketones, UA Negative Negative   RBC, UA Negative Negative   Bilirubin, UA Negative Negative   Urobilinogen, Ur 0.2 0.2 - 1.0 mg/dL   Nitrite, UA Negative Negative   Microscopic Examination See below:   Bladder Scan (Post Void Residual) in office   Collection Time: 08/04/24  4:20 PM  Result Value Ref Range   Scan Result 0ml    Assessment & Plan:   1. Urinary retention (Primary) Likely multifactorial in the setting of antimuscarinics, acute cystitis, recent functional decline, cognitive decline.  Voiding trial passed today.  UA appears contaminated, otherwise reasonably bland.  Will send urine for culture today, though we discussed that she may be chronically colonized.  Will repeat PVR next week for close follow-up.  I agree with stopping oxybutynin .  May revisit alternative pharmacotherapy in the future depending on her clinical course. - Urinalysis,  Complete - Bladder Scan (Post Void Residual) in office - CULTURE, URINE COMPREHENSIVE   Return in 5 days (on 08/09/2024) for Repeat PVR.  Lucie Hones, PA-C  [facility] [place] [address] [place], [place] [address] [phone]

## 2024-08-04 NOTE — Telephone Encounter (Signed)
 Please schedule patient for follow as follows:  Resident Clinic Template: HOSPITAL FOLLOW UP [facility]  Time: 1 hour slot Provider:  First option: Dr Virgle  Second option: Dr. Melia  Third option: Any available resident clinic spot Diagnosis: RUE abnormal movements/shaking Time range: Non-Urgent: between 2 - 3 months (do not schedule sooner) Day of week: Any

## 2024-08-05 LAB — MICROSCOPIC EXAMINATION: Epithelial Cells (non renal): >10 /HPF — AB (ref 0–10)

## 2024-08-05 LAB — URINALYSIS, COMPLETE
Bilirubin, UA: NEGATIVE
Glucose, UA: NEGATIVE
Ketones, UA: NEGATIVE
Nitrite, UA: NEGATIVE
Protein,UA: NEGATIVE
RBC, UA: NEGATIVE
Specific Gravity, UA: 1.01 (ref 1.005–1.030)
Urobilinogen, Ur: 0.2 mg/dL (ref 0.2–1.0)
pH, UA: 6 (ref 5.0–7.5)

## 2024-08-08 NOTE — Progress Notes (Unsigned)
 08/09/2024 8:22 AM   Cheryl [name] 05-02-1946 [id number]  Referring provider: Tobie Domino, MD [address] [place],  [place] [address]  Urological history: 1.  Urinary retention - Secondary to the setting of antimuscarinics, acute cystitis, recent functional decline and cognitive decline - Passed voiding trial on August 04, 2024   No chief complaint on file.  HPI: Cheryl [name] is a 77 y.o. woman who presents today for repeat PVR.   Previous records reviewed.  She was seen by Sam on August 04, 2024 for follow-up after having a Foley placement at [facility] in September with return of 1500 mL of urinary drainage.  She passed her voiding trial that day.         PMH: Past Medical History:  Diagnosis Date   Abdominal aneurysm    Allergy    seasonal   Anxiety    Arthritis    hips   Cataract    Complication of anesthesia    facial swelling after hysterectomy   Depression    Gastric ulcer 10/27/2010   Hypertension    Hypokalemia    Hyponatremia    Lymphocytic colitis    Osteopenia    Peptic ulcer    Piriformis syndrome    Pneumonia    Pre-diabetes    Radiculopathy of lumbar region    Ulcerative colitis (HCC)    Vertigo    with sinus issues   Wears dentures    partial upper    Surgical History: Past Surgical History:  Procedure Laterality Date   ABDOMINAL HYSTERECTOMY     APPENDECTOMY     BACK SURGERY     BREAST BIOPSY Left 2012   negative   CATARACT EXTRACTION, BILATERAL Bilateral 2005   COLONOSCOPY  2012   COLONOSCOPY WITH PROPOFOL  N/A 10/11/2015   Procedure: COLONOSCOPY WITH PROPOFOL ;  Surgeon: Rogelia Copping, MD;  Location: [facility];  Service: Endoscopy;  Laterality: N/A;   COLONOSCOPY WITH PROPOFOL  N/A 05/24/2021   Procedure: COLONOSCOPY WITH PROPOFOL ;  Surgeon: Dessa Reyes ORN, MD;  Location: [facility] ENDOSCOPY;  Service: Gastroenterology;  Laterality: N/A;   ESOPHAGOGASTRODUODENOSCOPY N/A 05/24/2021   Procedure:  ESOPHAGOGASTRODUODENOSCOPY (EGD);  Surgeon: Dessa Reyes ORN, MD;  Location: [facility] END[facility];  Service: Gastroenterology;  Laterality: N/A;   POLYPECTOMY  10/11/2015   Procedure: POLYPECTOMY;  Surgeon: Rogelia Copping, MD;  Location: [facility];  Service: Endoscopy;;   XI ROBOT ASSISTED RECTOPEXY N/A 10/09/2023   Procedure: XI ROBOT ASSISTED SUTURE RECTOPEXY;  Surgeon: Teresa Lonni HERO, MD;  Location: [facility];  Service: General;  Laterality: N/A;    Home Medications:  Allergies as of 08/09/2024       Reactions   Ceftriaxone  Dermatitis   Cephalexin  Dermatitis   Gramineae Pollens Other (See Comments)   Doxycycline Nausea Only, Palpitations   Shaky   Hydroxychloroquine Dermatitis   Sulfa Antibiotics Nausea Only   Shaking         Medication List        Accurate as of August 08, 2024  8:22 AM. If you have any questions, ask your nurse or doctor.          atorvastatin  20 MG tablet Commonly known as: LIPITOR Take 20 mg by mouth at bedtime.   busPIRone  5 MG tablet Commonly known as: BUSPAR  Take 5 mg by mouth 2 (two) times daily.   cetirizine  10 MG tablet Commonly known as: ZYRTEC  Take 1 tablet by mouth twice daily What changed: when to take  this   esterified estrogens 0.625 MG tablet Commonly known as: MENEST Take 0.625 mg by mouth daily.   feeding supplement Liqd Take 237 mLs by mouth 2 (two) times daily between meals.   GERITOL PO Take 1 tablet by mouth daily.   hydrocortisone  2.5 % ointment Apply topically 2 (two) times daily. Apply to aa's rash BID PRN.   latanoprost  0.005 % ophthalmic solution Commonly known as: XALATAN  Place 1 drop into both eyes at bedtime.   losartan  100 MG tablet Commonly known as: COZAAR  Take 100 mg by mouth daily.   naproxen  500 MG tablet Commonly known as: NAPROSYN  Take 1 tablet (500 mg total) by mouth 2 (two) times daily.   pantoprazole  40 MG tablet Commonly known as: PROTONIX  Take 40 mg by mouth daily.    polyethylene glycol 17 g packet Commonly known as: MIRALAX  / GLYCOLAX  Take 17 g by mouth daily.   venlafaxine  XR 75 MG 24 hr capsule Commonly known as: EFFEXOR -XR venlafaxine  ER 75 mg capsule,extended release 24 hr   ZINC  PO Take 1 tablet by mouth daily.        Allergies:  Allergies  Allergen Reactions   Ceftriaxone  Dermatitis   Cephalexin  Dermatitis   Gramineae Pollens Other (See Comments)   Doxycycline Nausea Only and Palpitations    Shaky   Hydroxychloroquine Dermatitis   Sulfa Antibiotics Nausea Only    Shaking     Family History: Family History  Problem Relation Age of Onset   Arthritis Mother    Diabetes Mother    Depression Mother    COPD Mother    Heart disease Mother    Hypertension Mother    Alcohol abuse Father    Stroke Father    Stroke Paternal Aunt    Breast cancer Maternal Grandmother     Social History:  reports that she has been smoking cigarettes. She has never used smokeless tobacco. She reports that she does not currently use alcohol after a past usage of about 3.0 standard drinks of alcohol per week. She reports that she does not use drugs.  ROS: Pertinent ROS in HPI  Physical Exam: There were no vitals taken for this visit.  Constitutional:  Well nourished. Alert and oriented, No acute distress. HEENT: [place] AT, moist mucus membranes.  Trachea midline, no masses. Cardiovascular: No clubbing, cyanosis, or edema. Respiratory: Normal respiratory effort, no increased work of breathing. GU: No CVA tenderness.  No bladder fullness or masses.  Recession of labia minora, dry, pale vulvar vaginal mucosa and loss of mucosal ridges and folds.  Normal urethral meatus, no lesions, no prolapse, no discharge.   No urethral masses, tenderness and/or tenderness. No bladder fullness, tenderness or masses. *** vagina mucosa, *** estrogen effect, no discharge, no lesions, *** pelvic support, *** cystocele and *** rectocele noted.  No cervical motion tenderness.   Uterus is freely mobile and non-fixed.  No adnexal/parametria masses or tenderness noted.  Anus and perineum are without rashes or lesions.   ***  Neurologic: Grossly intact, no focal deficits, moving all 4 extremities. Psychiatric: Normal mood and affect.    Laboratory Data: See Epic and HPI I have reviewed the labs.   Pertinent Imaging: ***  Assessment & Plan:  ***  1. Urinary retention - PVR demonstrates *** - Continue to avoid anticholinergics  No follow-ups on file.  These notes generated with voice recognition software. I apologize for typographical errors.  Cheryl [name]  [facility] [address]  [address] [place], [place] [address] [phone]

## 2024-08-09 ENCOUNTER — Ambulatory Visit (INDEPENDENT_AMBULATORY_CARE_PROVIDER_SITE_OTHER): Admitting: Urology

## 2024-08-09 ENCOUNTER — Encounter: Payer: Self-pay | Admitting: Urology

## 2024-08-09 VITALS — BP 111/78 | HR 87 | Ht 60.0 in | Wt 103.0 lb

## 2024-08-09 DIAGNOSIS — R339 Retention of urine, unspecified: Secondary | ICD-10-CM | POA: Diagnosis not present

## 2024-08-09 DIAGNOSIS — N952 Postmenopausal atrophic vaginitis: Secondary | ICD-10-CM | POA: Diagnosis not present

## 2024-08-09 LAB — BLADDER SCAN AMB NON-IMAGING

## 2024-08-09 MED ORDER — ESTRADIOL 0.01 % VA CREA: TOPICAL_CREAM | VAGINAL | 12 refills | Status: AC

## 2024-08-10 ENCOUNTER — Ambulatory Visit: Payer: Self-pay | Admitting: Physician Assistant

## 2024-08-10 LAB — CULTURE, URINE COMPREHENSIVE

## 2024-10-17 ENCOUNTER — Other Ambulatory Visit: Payer: Self-pay | Admitting: Family Medicine

## 2024-10-17 DIAGNOSIS — R911 Solitary pulmonary nodule: Secondary | ICD-10-CM

## 2024-10-28 NOTE — Progress Notes (Signed)
 Subjective Patient ID: Cheryl [name] is a 79 y.o. female.    Cheryl [name] is a 79 y.o. female presents to the clinic complaining of nasal congestion, cough, generalized weakness, fatigue, body aches, and low-grade fever for the past 2 days.  Patient has been around another family member who is sick.  She has not taken any medication for her symptom aside from Tylenol .     History provided by:  Patient Language interpreter used: No     Review of Systems  Constitutional:  Positive for fatigue and fever. Negative for chills.  HENT:  Positive for congestion. Negative for ear discharge, ear pain, postnasal drip, rhinorrhea, sinus pressure, sinus pain, sore throat, trouble swallowing and voice change.   Respiratory:  Positive for cough. Negative for chest tightness and shortness of breath.   Musculoskeletal:  Positive for myalgias.  Neurological:  Positive for weakness (generalized) and headaches.    Patient History  Allergies: Allergies  Allergen Reactions   Cephalexin  Dermatitis and Rash   Doxycycline  Nausea Only and Palpitations    Shaky, palpitations  Shaky   Hydroxychloroquine Dermatitis and Rash   Sulfa Antibiotics Nausea Only and Other    Shaking    Past Medical History:  Diagnosis Date   Acid reflux    At high risk for falls    Current smoker    half pack a day   Dementia (CMS/HCC)    Family history of CVA    Hiatal hernia    Hip joint pain    right hip   Hx of adenomatous colonic polyps    Hypoglycemia    Lung nodule    69mm-needs repeat noncontrast   Neurogenic bladder    Pre-diabetes    History reviewed. No pertinent surgical history. Social History   Socioeconomic History   Marital status: Not on file    Spouse name: Not on file   Number of children: Not on file   Years of education: Not on file   Highest education level: Not on file  Occupational History   Not on file  Tobacco Use   Smoking status: Every Day    Current  packs/day: 0.50    Average packs/day: 0.5 packs/day for 1 year (0.5 ttl pk-yrs)    Types: Cigarettes    Start date: 2025   Smokeless tobacco: Never  Substance and Sexual Activity   Alcohol use: Not on file   Drug use: Not on file   Sexual activity: Not on file  Other Topics Concern   Not on file  Social History Narrative   Not on file   History reviewed. No pertinent family history. Current Outpatient Medications on File Prior to Visit  Medication Sig Dispense Refill   atorvastatin  (Lipitor) 80 MG tablet Take 80 mg by mouth 1 (one) time each day.     bisacodyl  (Dulcolax) 5 MG EC tablet Take 20 mg by mouth as needed.     busPIRone  (Buspar ) 5 MG tablet Take 7.5 mg by mouth in the morning and 7.5 mg in the evening.     donepezil  (Aricept ) 10 MG tablet Take 10 mg by mouth 1 (one) time each day.     folic acid (Folvite) 1 MG tablet Take 1 mg by mouth 1 (one) time each day.     losartan  (Cozaar ) 100 MG tablet Take 100 mg by mouth 1 (one) time each day.     methotrexate (Trexall) 7.5 MG tablet Take by mouth 1 (one) time per week  Follow directions  carefully, and ask to explain any part you do not understand. Take exactly as directed.     pantoprazole  (ProtoNix ) 40 MG EC tablet Oral     sertraline  (Zoloft ) 50 MG tablet TAKE 1 TABLET BY MOUTH NIGHTLY FOR ONE WEEK, THEN INCREASE TO 2 TABLETS NIGHTLY FOR ANXIETY     triamcinolone  (Kenalog ) 0.1 % cream Apply topically 2 (two) times a day. 45 g 0   beclomethasone (Beconase-AQ) 42 MCG/SPRAY nasal spray Administer 2 sprays into affected nostril(s) in the morning and 2 sprays in the evening.     buPROPion  SR (Wellbutrin  SR) 150 MG 12 hr tablet Take 150 mg by mouth in the morning and 150 mg in the evening.     lisinopril-hydroCHLOROthiazide 10-12.5 MG tablet 1 tablet Orally Once a day; Duration: 30 day(s) (Patient not taking: Reported on 10/28/2024)     venlafaxine  XR (Effoxor-XR) 37.5 MG 24 hr capsule      No current  facility-administered medications on file prior to visit.    Objective  Vitals:   10/28/24 1303 10/28/24 1339  BP: (!) 153/84   Pulse: 96 100  Resp: 18   Temp: (!) 37.7 C (99.9 F)   TempSrc: Tympanic   SpO2: (!) 94% 95%  Weight: 45.8 kg   Height: 5' 1   PainSc: 0-No pain         OBGYN/Pregnancy Status: Hysterectomy      No results found.  Physical Exam Vitals and nursing note reviewed.  Constitutional:      Appearance: Normal appearance.  HENT:     Head: Normocephalic.     Right Ear: Tympanic membrane, ear canal and external ear normal.     Left Ear: Tympanic membrane, ear canal and external ear normal.     Nose: Congestion present. No rhinorrhea.     Right Turbinates: Not enlarged or swollen.     Left Turbinates: Not enlarged or swollen.     Mouth/Throat:     Lips: Pink.     Mouth: Mucous membranes are moist.     Tongue: No lesions.     Palate: No lesions.     Pharynx: Oropharynx is clear. Uvula midline. No posterior oropharyngeal erythema.     Tonsils: No tonsillar exudate. 0 on the right. 0 on the left.  Eyes:     Conjunctiva/sclera: Conjunctivae normal.  Neck:     Trachea: Phonation normal.  Cardiovascular:     Rate and Rhythm: Normal rate and regular rhythm.     Heart sounds: Normal heart sounds.  Pulmonary:     Effort: Pulmonary effort is normal.     Breath sounds: Normal breath sounds and air entry.     Comments: Speaking in full sentences, no increase in work of breathing ambulating through the clinic.  Cough appreciated during exam.   Musculoskeletal:     Cervical back: Normal range of motion and neck supple. No rigidity. Normal range of motion.  Lymphadenopathy:     Head:     Right side of head: No submental, submandibular, tonsillar, preauricular, posterior auricular or occipital adenopathy.     Left side of head: No submental, submandibular, tonsillar, preauricular, posterior auricular or occipital adenopathy.     Cervical: No cervical  adenopathy.  Skin:    General: Skin is warm and dry.     Capillary Refill: Capillary refill takes less than 2 seconds.  Neurological:     Mental Status: She is alert.  Psychiatric:        Mood  and Affect: Mood normal.        Behavior: Behavior normal.     Results for orders placed or performed in visit on 10/28/24  POCT QuickVue Antigen Test  Component Result   Rapid Covid Negative   Internal Quality Control Pass  POCT RAPID INFLUENZA MCKESSON  Component Result   Rapid Influenza A Ag Positive (A)   Rapid Influenza B Ag Negative   Internal Quality Control Pass       Procedures MDM:     1 Acute, uncomplicated illness or injury     Explanation of Medical Decision Making and variances from expected care:    Influenza A positive.  Negative COVID. Rx Tamiflu . Rx Atrovent and benzonatate for symptomatic management. Discussed supportive care and OTC medications for symptomatic management. Recheck if worsening or not improving.  Recommended PCP follow up.  Strict ER precautions given.    Unique ordered tests: Two     Review of any test results: Two     Assessment requiring historian other than patient: No     Independent visualization of image, tracing, or test: No     Discussion of management with another provider: No     Risk:: Moderate            Assessment/Plan Diagnoses and all orders for this visit:  Influenza A -     oseltamivir  (Tamiflu ) 75 MG capsule; Take 1 capsule (75 mg total) by mouth in the morning and 1 capsule (75 mg total) in the evening. Do all this for 5 days.  Acute cough -     POCT QuickVue Antigen Test -     POCT RAPID INFLUENZA MCKESSON -     benzonatate (Tessalon) 200 MG capsule; Take 1 capsule (200 mg total) by mouth 3 (three) times a day if needed for cough for up to 7 days. Do not crush or chew.  Nasal congestion -     POCT QuickVue Antigen Test -     POCT RAPID INFLUENZA MCKESSON -     ipratropium (Atrovent) 0.06 % nasal spray;  Administer 2 sprays into each nostril 4 (four) times a day.     Disposition Status: Home  Patient Instructions  Influenza (Flu): What to Expect & When to Seek Care Influenza (Gripe): Michelina Feathers y [employer] [place]  You have been diagnosed with influenza (flu) or have symptoms consistent with the flu. Influenza is a viral illness. Antibiotics do not treat the flu. Usted ha sido diagnosticado(a) con influenza (gripe) o presenta sntomas compatibles. La gripe es una enfermedad viral. Los antibiticos no la tratan.    Common Symptoms [place]   Fever or chills   Body aches   Headache   Cough   Sore throat   Runny or stuffy nose   Fatigue or weakness   Nausea, vomiting, or diarrhea (less common)  Fiebre o escalofros   Dolores corporales o musculares   Dolor de cabeza   Tos   Dolor de garganta   Congestin o secrecin nasal   Cansancio o debilidad   Nuseas, vmitos o diarrea (menos comn)    What to Expect [place]GL[place]   Most people feel sick 5-7 days   Cough and fatigue may last 1-2 weeks   Fever usually improves in several days   Body aches and fatigue improve gradually   Cough may linger   Most contagious during the first few days   Rest and fluids are essential  [employer] se  siente enferma 5-7 [address] tos y el cansancio pueden durar 1-2 semanas   La fiebre suele mejorar en varios [address] dolores y el cansancio mejoran gradualmente   La tos puede persistir   [employer] primeros das   El descanso y los lquidos son esenciales    Over-the-Counter Medications [place]GL[place]  For fever or pain:   Acetaminophen  (Tylenol )   Ibuprofen  (Advil , Motrin )  ?? Avoid ibuprofen  if kidney disease, stomach ulcers, or told not to take NSAIDs   ?? Do not exceed recommended doses  ?? Avoid aspirin unless directed Para fiebre o dolor:   Acetaminofn (Tylenol )   Ibuprofeno (Advil ,  Motrin )  ?? Evite ibuprofeno si tiene enfermedad renal, lceras gstricas o le indicaron no usar AINEs   ?? No exceda las dosis  ?? Evite la aspirina salvo indicacin    Cough & Congestion Relief [place]GL[place]  Cough:   Dextromethorphan cough suppressants   Honey   Warm fluids   Congestion / sinus pressure:   Saline nasal spray   Pseudoephedrine (Sudafed -- behind pharmacy counter)  ?? Do NOT use phenylephrine  (PE)  ?? Use only if no heart disease, high blood pressure, or glaucoma Tos:   Antitusivos con dextrometorfano   Miel   Lquidos tibios   Congestin / presin sinusal:   Spray nasal salino   Pseudoefedrina (Sudafed -- detrs del government social research officer)  ?? NO use fenilefrina (PE)  ?? Use solo si no tiene enfermedad cardaca, hipertensin o glaucoma   ?? Check labels to avoid duplicate ingredients ?? Revise las etiquetas para evitar ingredientes duplicados  Antiviral Medication (Tamiflu  / Oseltamivir ) [place]GL[place]   Take exactly as prescribed   Works best within 48 hours   May shorten illness   Nausea or upset stomach may occur (take with food)   ?? Finish the full course  Freight forwarder se indic   Funciona mejor dentro de 48 horas   Puede acortar la enfermedad   Puede causar nuseas (tmelo con comida)   ?? Complete todo el tratamiento    Home Care [place]GL[place]   Rest   Drink plenty of fluids   Stay home until fever-free 24 hours without medicine   Wash hands and cover coughs/sneezes  [place]canse   Tome muchos lquidos   Permanezca en casa hasta 24 horas sin fiebre sin medicamentos   Lvese las manos y cubra tos/estornudos    When to Seek Re-Evaluation ENGLISH [place]   Fever lasting more than 7 days   Feel better then worsen   Worsening cough or chest discomfort   Unable to keep fluids down   Significant weakness or dizziness   Chronic illness, pregnancy, or weakened immune system with worsening symptoms   Fiebre por ms de 7 das   Mejora y [employer] scripts empeora   Tos que empeora o copywriter, advertising en el pecho   No puede retener lquidos   Debilidad marcada o mareos   Enfermedades crnicas, embarazo o inmunosupresin con empeoramiento    ?? Emergency Care ENGLISH [employer]   Trouble breathing   Chest pain or pressure   Confusion or difficulty staying awake   Bluish lips or face   Signs of severe dehydration   Very high fever not improving   Call 911 or go to the nearest Emergency Department  Dificultad para respirar   Dolor o presin en el pecho   Confusin o dificultad para mantenerse despierto   Labios o cara azulados  Deshidratacin severa   Fiebre muy alta que no mejora   Llame al 911 o acuda a la sala de emergencias ms cercana     Progress note signed by Gerard Gaskins, PA on 10/28/24 at  2:52 PM

## 2024-11-01 ENCOUNTER — Emergency Department

## 2024-11-01 ENCOUNTER — Other Ambulatory Visit: Payer: Self-pay

## 2024-11-01 ENCOUNTER — Inpatient Hospital Stay
Admission: EM | Admit: 2024-11-01 | Discharge: 2024-11-10 | DRG: 193 | Disposition: A | Discharge status: Home Health | Attending: Student | Admitting: Student

## 2024-11-01 DIAGNOSIS — Z79899 Other long term (current) drug therapy: Secondary | ICD-10-CM

## 2024-11-01 DIAGNOSIS — E785 Hyperlipidemia, unspecified: Secondary | ICD-10-CM | POA: Diagnosis present

## 2024-11-01 DIAGNOSIS — Z8249 Family history of ischemic heart disease and other diseases of the circulatory system: Secondary | ICD-10-CM

## 2024-11-01 DIAGNOSIS — Z716 Tobacco abuse counseling: Secondary | ICD-10-CM

## 2024-11-01 DIAGNOSIS — E876 Hypokalemia: Secondary | ICD-10-CM | POA: Diagnosis present

## 2024-11-01 DIAGNOSIS — J440 Chronic obstructive pulmonary disease with acute lower respiratory infection: Secondary | ICD-10-CM | POA: Diagnosis present

## 2024-11-01 DIAGNOSIS — D72829 Elevated white blood cell count, unspecified: Secondary | ICD-10-CM | POA: Diagnosis not present

## 2024-11-01 DIAGNOSIS — Z7983 Long term (current) use of bisphosphonates: Secondary | ICD-10-CM

## 2024-11-01 DIAGNOSIS — L309 Dermatitis, unspecified: Secondary | ICD-10-CM | POA: Diagnosis present

## 2024-11-01 DIAGNOSIS — E871 Hypo-osmolality and hyponatremia: Secondary | ICD-10-CM | POA: Diagnosis present

## 2024-11-01 DIAGNOSIS — Z825 Family history of asthma and other chronic lower respiratory diseases: Secondary | ICD-10-CM

## 2024-11-01 DIAGNOSIS — J9601 Acute respiratory failure with hypoxia: Secondary | ICD-10-CM | POA: Diagnosis present

## 2024-11-01 DIAGNOSIS — Z90710 Acquired absence of both cervix and uterus: Secondary | ICD-10-CM

## 2024-11-01 DIAGNOSIS — F17210 Nicotine dependence, cigarettes, uncomplicated: Secondary | ICD-10-CM | POA: Diagnosis present

## 2024-11-01 DIAGNOSIS — E440 Moderate protein-calorie malnutrition: Secondary | ICD-10-CM | POA: Diagnosis present

## 2024-11-01 DIAGNOSIS — I10 Essential (primary) hypertension: Secondary | ICD-10-CM | POA: Diagnosis present

## 2024-11-01 DIAGNOSIS — Z6823 Body mass index (BMI) 23.0-23.9, adult: Secondary | ICD-10-CM

## 2024-11-01 DIAGNOSIS — Z79890 Hormone replacement therapy (postmenopausal): Secondary | ICD-10-CM

## 2024-11-01 DIAGNOSIS — F419 Anxiety disorder, unspecified: Secondary | ICD-10-CM | POA: Diagnosis present

## 2024-11-01 DIAGNOSIS — T380X5A Adverse effect of glucocorticoids and synthetic analogues, initial encounter: Secondary | ICD-10-CM | POA: Diagnosis not present

## 2024-11-01 DIAGNOSIS — F32A Depression, unspecified: Secondary | ICD-10-CM | POA: Diagnosis present

## 2024-11-01 DIAGNOSIS — J189 Pneumonia, unspecified organism: Principal | ICD-10-CM | POA: Diagnosis present

## 2024-11-01 DIAGNOSIS — J1001 Influenza due to other identified influenza virus with the same other identified influenza virus pneumonia: Principal | ICD-10-CM | POA: Diagnosis present

## 2024-11-01 DIAGNOSIS — M069 Rheumatoid arthritis, unspecified: Secondary | ICD-10-CM | POA: Diagnosis present

## 2024-11-01 DIAGNOSIS — Z833 Family history of diabetes mellitus: Secondary | ICD-10-CM

## 2024-11-01 DIAGNOSIS — D509 Iron deficiency anemia, unspecified: Secondary | ICD-10-CM | POA: Diagnosis present

## 2024-11-01 DIAGNOSIS — Z7952 Long term (current) use of systemic steroids: Secondary | ICD-10-CM

## 2024-11-01 DIAGNOSIS — F0393 Unspecified dementia, unspecified severity, with mood disturbance: Secondary | ICD-10-CM | POA: Diagnosis present

## 2024-11-01 LAB — CBC
HCT: 34.4 % — ABNORMAL LOW (ref 36.0–46.0)
Hemoglobin: 11.7 g/dL — ABNORMAL LOW (ref 12.0–15.0)
MCH: 30 pg (ref 26.0–34.0)
MCHC: 34 g/dL (ref 30.0–36.0)
MCV: 88.2 fL (ref 80.0–100.0)
Platelets: 243 K/uL (ref 150–400)
RBC: 3.9 MIL/uL (ref 3.87–5.11)
RDW: 16.8 % — ABNORMAL HIGH (ref 11.5–15.5)
WBC: 19.3 K/uL — ABNORMAL HIGH (ref 4.0–10.5)
nRBC: 0 % (ref 0.0–0.2)

## 2024-11-01 LAB — BASIC METABOLIC PANEL WITH GFR
Anion gap: 12 (ref 5–15)
BUN: 12 mg/dL (ref 8–23)
CO2: 25 mmol/L (ref 22–32)
Calcium: 8.8 mg/dL — ABNORMAL LOW (ref 8.9–10.3)
Chloride: 95 mmol/L — ABNORMAL LOW (ref 98–111)
Creatinine, Ser: 0.72 mg/dL (ref 0.44–1.00)
GFR, Estimated: >60 mL/min
Glucose, Bld: 80 mg/dL (ref 70–99)
Potassium: 3 mmol/L — ABNORMAL LOW (ref 3.5–5.1)
Sodium: 132 mmol/L — ABNORMAL LOW (ref 135–145)

## 2024-11-01 LAB — MAGNESIUM: Magnesium: 2.2 mg/dL (ref 1.7–2.4)

## 2024-11-01 LAB — FOLATE: Folate: 12.6 ng/mL

## 2024-11-01 LAB — LACTIC ACID, PLASMA: Lactic Acid, Venous: 1.6 mmol/L (ref 0.5–1.9)

## 2024-11-01 LAB — TROPONIN T, HIGH SENSITIVITY
Troponin T High Sensitivity: <15 ng/L (ref 0–19)
Troponin T High Sensitivity: <15 ng/L (ref 0–19)

## 2024-11-01 LAB — IRON AND TIBC
Iron: 19 ug/dL — ABNORMAL LOW (ref 28–170)
Saturation Ratios: 10 % — ABNORMAL LOW (ref 10.4–31.8)
TIBC: 195 ug/dL — ABNORMAL LOW (ref 250–450)
UIBC: 176 ug/dL

## 2024-11-01 LAB — VITAMIN B12: Vitamin B-12: 2329 pg/mL — ABNORMAL HIGH (ref 180–914)

## 2024-11-01 LAB — VITAMIN D 25 HYDROXY (VIT D DEFICIENCY, FRACTURES): Vit D, 25-Hydroxy: 47.6 ng/mL (ref 30–100)

## 2024-11-01 MED ORDER — BUSPIRONE HCL 15 MG PO TABS
7.5000 mg | ORAL_TABLET | Freq: Two times a day (BID) | ORAL | Status: DC
  Administered 2024-11-01 – 2024-11-10 (×18): 7.5 mg via ORAL
  Filled 2024-11-01 (×18): qty 1

## 2024-11-01 MED ORDER — GUAIFENESIN ER 600 MG PO TB12
600.0000 mg | ORAL_TABLET | Freq: Two times a day (BID) | ORAL | Status: DC
  Administered 2024-11-01 – 2024-11-10 (×18): 600 mg via ORAL
  Filled 2024-11-01 (×18): qty 1

## 2024-11-01 MED ORDER — OSELTAMIVIR PHOSPHATE 30 MG PO CAPS
30.0000 mg | ORAL_CAPSULE | Freq: Two times a day (BID) | ORAL | Status: AC
  Administered 2024-11-01 – 2024-11-03 (×4): 30 mg via ORAL
  Filled 2024-11-01 (×4): qty 1

## 2024-11-01 MED ORDER — SODIUM CHLORIDE 0.9 % IV SOLN: 500.0000 mg | Freq: Every day | INTRAVENOUS | Status: DC

## 2024-11-01 MED ORDER — BUPROPION HCL ER (XL) 150 MG PO TB24
150.0000 mg | ORAL_TABLET | Freq: Every day | ORAL | Status: DC
  Administered 2024-11-02: 150 mg via ORAL
  Filled 2024-11-01: qty 1

## 2024-11-01 MED ORDER — SODIUM CHLORIDE 0.9% FLUSH
3.0000 mL | Freq: Two times a day (BID) | INTRAVENOUS | Status: DC
  Administered 2024-11-01 – 2024-11-03 (×4): 3 mL via INTRAVENOUS

## 2024-11-01 MED ORDER — SODIUM CHLORIDE 0.9 % IV SOLN
2.0000 g | Freq: Once | INTRAVENOUS | Status: AC
  Administered 2024-11-01: 2 g via INTRAVENOUS
  Filled 2024-11-01: qty 20

## 2024-11-01 MED ORDER — PANTOPRAZOLE SODIUM 40 MG PO TBEC
40.0000 mg | DELAYED_RELEASE_TABLET | Freq: Every day | ORAL | Status: DC
  Administered 2024-11-01 – 2024-11-10 (×10): 40 mg via ORAL
  Filled 2024-11-01 (×10): qty 1

## 2024-11-01 MED ORDER — POTASSIUM CHLORIDE CRYS ER 20 MEQ PO TBCR
40.0000 meq | EXTENDED_RELEASE_TABLET | Freq: Once | ORAL | Status: AC
  Administered 2024-11-01: 40 meq via ORAL
  Filled 2024-11-01: qty 2

## 2024-11-01 MED ORDER — SODIUM CHLORIDE 0.9 % IV SOLN
1.0000 g | Freq: Every day | INTRAVENOUS | Status: AC
  Administered 2024-11-02 – 2024-11-05 (×4): 1 g via INTRAVENOUS
  Filled 2024-11-01 (×4): qty 10

## 2024-11-01 MED ORDER — OXYCODONE HCL 5 MG PO TABS
5.0000 mg | ORAL_TABLET | ORAL | Status: DC | PRN
  Administered 2024-11-02 – 2024-11-03 (×5): 5 mg via ORAL
  Filled 2024-11-01 (×5): qty 1

## 2024-11-01 MED ORDER — LORATADINE 10 MG PO TABS
10.0000 mg | ORAL_TABLET | Freq: Every day | ORAL | Status: DC
  Administered 2024-11-02 – 2024-11-10 (×9): 10 mg via ORAL
  Filled 2024-11-01 (×9): qty 1

## 2024-11-01 MED ORDER — SODIUM CHLORIDE 0.9 % IV SOLN
100.0000 mg | Freq: Two times a day (BID) | INTRAVENOUS | Status: AC
  Administered 2024-11-02 – 2024-11-06 (×10): 100 mg via INTRAVENOUS
  Filled 2024-11-01 (×10): qty 100

## 2024-11-01 MED ORDER — ACETAMINOPHEN 325 MG PO TABS
650.0000 mg | ORAL_TABLET | Freq: Four times a day (QID) | ORAL | Status: DC | PRN
  Administered 2024-11-02 – 2024-11-03 (×2): 650 mg via ORAL
  Filled 2024-11-01 (×2): qty 2

## 2024-11-01 MED ORDER — LOSARTAN POTASSIUM 50 MG PO TABS
100.0000 mg | ORAL_TABLET | Freq: Every day | ORAL | Status: DC
  Administered 2024-11-02 – 2024-11-10 (×9): 100 mg via ORAL
  Filled 2024-11-01 (×9): qty 2

## 2024-11-01 MED ORDER — LEVOFLOXACIN IN D5W 750 MG/150ML IV SOLN: 750.0000 mg | Freq: Once | INTRAVENOUS | Status: DC

## 2024-11-01 MED ORDER — DONEPEZIL HCL 5 MG PO TABS
5.0000 mg | ORAL_TABLET | Freq: Every day | ORAL | Status: DC
  Administered 2024-11-01: 5 mg via ORAL
  Filled 2024-11-01: qty 1

## 2024-11-01 MED ORDER — BISACODYL 5 MG PO TBEC: 10.0000 mg | DELAYED_RELEASE_TABLET | Freq: Every day | ORAL | Status: DC | PRN

## 2024-11-01 MED ORDER — TRAMADOL HCL 50 MG PO TABS
50.0000 mg | ORAL_TABLET | Freq: Once | ORAL | Status: AC
  Administered 2024-11-01: 50 mg via ORAL
  Filled 2024-11-01: qty 1

## 2024-11-01 MED ORDER — ONDANSETRON HCL 4 MG/2ML IJ SOLN: 4.0000 mg | Freq: Four times a day (QID) | INTRAMUSCULAR | Status: DC | PRN

## 2024-11-01 MED ORDER — HYDROMORPHONE HCL 1 MG/ML IJ SOLN
0.5000 mg | INTRAMUSCULAR | Status: DC | PRN
  Administered 2024-11-04: 1 mg via INTRAVENOUS
  Filled 2024-11-01: qty 1

## 2024-11-01 MED ORDER — SODIUM CHLORIDE 0.9 % IV SOLN
500.0000 mg | Freq: Once | INTRAVENOUS | Status: AC
  Administered 2024-11-01: 500 mg via INTRAVENOUS
  Filled 2024-11-01: qty 5

## 2024-11-01 MED ORDER — SODIUM CHLORIDE 0.9 % IV BOLUS
1000.0000 mL | Freq: Once | INTRAVENOUS | Status: AC
  Administered 2024-11-01: 1000 mL via INTRAVENOUS

## 2024-11-01 MED ORDER — ALBUTEROL SULFATE (2.5 MG/3ML) 0.083% IN NEBU: 2.5000 mg | INHALATION_SOLUTION | RESPIRATORY_TRACT | Status: DC | PRN

## 2024-11-01 MED ORDER — ONDANSETRON HCL 4 MG PO TABS: 4.0000 mg | ORAL_TABLET | Freq: Four times a day (QID) | ORAL | Status: DC | PRN

## 2024-11-01 MED ORDER — AZITHROMYCIN 500 MG PO TABS: 500.0000 mg | ORAL_TABLET | Freq: Every day | ORAL | Status: DC

## 2024-11-01 MED ORDER — SODIUM CHLORIDE 0.9% FLUSH
3.0000 mL | Freq: Two times a day (BID) | INTRAVENOUS | Status: DC
  Administered 2024-11-01 – 2024-11-10 (×18): 3 mL via INTRAVENOUS

## 2024-11-01 MED ORDER — SODIUM CHLORIDE 0.9 % IV SOLN: 250.0000 mL | INTRAVENOUS | Status: AC | PRN

## 2024-11-01 MED ORDER — ACETAMINOPHEN 325 MG PO TABS
650.0000 mg | ORAL_TABLET | Freq: Once | ORAL | Status: AC
  Administered 2024-11-01: 650 mg via ORAL
  Filled 2024-11-01: qty 2

## 2024-11-01 MED ORDER — SERTRALINE HCL 50 MG PO TABS
100.0000 mg | ORAL_TABLET | Freq: Every day | ORAL | Status: DC
  Administered 2024-11-01 – 2024-11-09 (×9): 100 mg via ORAL
  Filled 2024-11-01 (×9): qty 2

## 2024-11-01 MED ORDER — ACETAMINOPHEN 650 MG RE SUPP: 650.0000 mg | Freq: Four times a day (QID) | RECTAL | Status: DC | PRN

## 2024-11-01 MED ORDER — VENLAFAXINE HCL ER 75 MG PO CP24
150.0000 mg | ORAL_CAPSULE | Freq: Every day | ORAL | Status: DC
  Administered 2024-11-01 – 2024-11-10 (×10): 150 mg via ORAL
  Filled 2024-11-01 (×10): qty 2

## 2024-11-01 MED ORDER — SODIUM CHLORIDE 0.9% FLUSH: 3.0000 mL | INTRAVENOUS | Status: DC | PRN

## 2024-11-01 MED ORDER — ENOXAPARIN SODIUM 40 MG/0.4ML IJ SOSY
40.0000 mg | PREFILLED_SYRINGE | Freq: Every day | INTRAMUSCULAR | Status: DC
  Administered 2024-11-02 – 2024-11-10 (×9): 40 mg via SUBCUTANEOUS
  Filled 2024-11-01 (×9): qty 0.4

## 2024-11-01 MED ORDER — HYDROCOD POLI-CHLORPHE POLI ER 10-8 MG/5ML PO SUER
5.0000 mL | Freq: Two times a day (BID) | ORAL | Status: DC | PRN
  Administered 2024-11-01 – 2024-11-04 (×3): 5 mL via ORAL
  Filled 2024-11-01 (×3): qty 5

## 2024-11-01 NOTE — Plan of Care (Signed)
  Problem: Pain Managment: Goal: General experience of comfort will improve and/or be controlled Outcome: Progressing   Problem: Safety: Goal: Ability to remain free from injury will improve Outcome: Progressing   Problem: Skin Integrity: Goal: Risk for impaired skin integrity will decrease Outcome: Progressing

## 2024-11-01 NOTE — H&P (Signed)
 [facility] History and Physical   Patient: Cheryl [name] FMW:[id number]   PCP: Tobie Domino, MD DOB: 05-29-1946   DOA: 11/01/2024   DOS: 11/01/2024   DOS: the patient was seen and examined on 11/01/2024  Patient coming from: The patient is coming from Home  Chief Complaint: Shortness of breath and cough, generalized body  HPI: Cheryl [name] is a 79 y.o. female with Past medical history of HTN, HLD, depression, DJD, rheumatoid arthritis as reviewed from EMR, presented with complaining of having cough, shortness of breath and fatigue, generalized body ache.  As per patient she was positive for flu few days ago and she was given treatment for that but it is not helping and her condition was getting worse so she came into the ED.   ED Course: VS afebrile, HR 78, RR 20, BP 98/60, 98% on RA BMP sodium 122, hyponatremic, potassium 3.0, hypokalemia, chloride 95, calcium  8.8, rest within normal range CBC:WBC 19.3, Hb 11.7 CXR: Multifocal pneumonia EKG: Sinus rhythm, nonspecific STW changes QTc 504    Review of Systems: as mentioned in the history of present illness.  All other systems reviewed and are negative.  Past Medical History:  Diagnosis Date   Abdominal aneurysm    Allergy    seasonal   Anxiety    Arthritis    hips   Cataract    Complication of anesthesia    facial swelling after hysterectomy   Depression    Gastric ulcer 10/27/2010   Hypertension    Hypokalemia    Hyponatremia    Lymphocytic colitis    Osteopenia    Peptic ulcer    Piriformis syndrome    Pneumonia    Pre-diabetes    Radiculopathy of lumbar region    Ulcerative colitis (HCC)    Vertigo    with sinus issues   Wears dentures    partial upper   Past Surgical History:  Procedure Laterality Date   ABDOMINAL HYSTERECTOMY     APPENDECTOMY     BACK SURGERY     BREAST BIOPSY Left 2012   negative   CATARACT EXTRACTION, BILATERAL Bilateral 2005   COLONOSCOPY  2012   COLONOSCOPY WITH PROPOFOL   N/A 10/11/2015   Procedure: COLONOSCOPY WITH PROPOFOL ;  Surgeon: Rogelia Copping, MD;  Location: [facility];  Service: Endoscopy;  Laterality: N/A;   COLONOSCOPY WITH PROPOFOL  N/A 05/24/2021   Procedure: COLONOSCOPY WITH PROPOFOL ;  Surgeon: Dessa Reyes ORN, MD;  Location: [facility] ENDOSCOPY;  Service: Gastroenterology;  Laterality: N/A;   ESOPHAGOGASTRODUODENOSCOPY N/A 05/24/2021   Procedure: ESOPHAGOGASTRODUODENOSCOPY (EGD);  Surgeon: Dessa Reyes ORN, MD;  Location: [facility] ENDOS[facility];  Service: Gastroenterology;  Laterality: N/A;   POLYPECTOMY  10/11/2015   Procedure: POLYPECTOMY;  Surgeon: Rogelia Copping, MD;  Location: [facility];  Service: Endoscopy;;   XI ROBOT ASSISTED RECTOPEXY N/A 10/09/2023   Procedure: XI ROBOT ASSISTED SUTURE RECTOPEXY;  Surgeon: Teresa Lonni HERO, MD;  Location: [facility];  Service: General;  Laterality: N/A;   Social History:  reports that she has been smoking cigarettes. She has never used smokeless tobacco. She reports that she does not currently use alcohol after a past usage of about 3.0 standard drinks of alcohol per week. She reports that she does not use drugs.  Allergies[1]   Family history reviewed and not pertinent Family History  Problem Relation Age of Onset   Arthritis Mother    Diabetes Mother    Depression Mother    COPD Mother  Heart disease Mother    Hypertension Mother    Alcohol abuse Father    Stroke Father    Stroke Paternal Aunt    Breast cancer Maternal Grandmother      Prior to Admission medications  Medication Sig Start Date End Date Taking? Authorizing Provider  albuterol  (VENTOLIN  HFA) 108 (90 Base) MCG/ACT inhaler     [provider]  alendronate (FOSAMAX) 70 MG tablet 1 tablet Orally once a week; Duration: 30 day(s)    [provider]  atorvastatin  (LIPITOR) 20 MG tablet Take 20 mg by mouth at bedtime.    [provider]  bisacodyl  (DULCOLAX) 5 MG EC tablet Take 20 mg by mouth.  09/07/23   [provider]  Blood Gluc Meter Disp-Strips (RELION ALL-IN-ONE) DEVI RELION ALL-IN-ONE DEVI 01/23/24   [provider]  buPROPion  (WELLBUTRIN  XL) 150 MG 24 hr tablet 1 tablet in the morning Orally Once a day; Duration: 30 day(s)    [provider]  busPIRone  (BUSPAR ) 5 MG tablet Take 5 mg by mouth 2 (two) times daily.    [provider]  cephALEXin  (KEFLEX ) 500 MG capsule Take 500 mg by mouth 4 (four) times daily. 04/20/24   [provider]  cetirizine  (ZYRTEC ) 10 MG tablet Take 1 tablet by mouth twice daily Patient taking differently: Take 10 mg by mouth daily. 06/15/23   Claudene Lehmann, MD  ciprofloxacin (CIPRO) 500 MG tablet Take 500 mg by mouth 2 (two) times daily.    [provider]  clobetasol cream (TEMOVATE) 0.05 % CLOBETASOL PROPIONATE 0.05 % CREA 03/17/24   [provider]  clobetasol ointment (TEMOVATE) 0.05 % Apply the medication twice daily to most severely affected areas of the skin until smooth and flat. Then stop and re-start if needed. Avoid use on the face. 04/27/24   [provider]  Docusate Sodium (DSS) 100 MG CAPS Take 100 mg by mouth. 12/05/21   [provider]  donepezil  (ARICEPT ) 5 MG tablet Take 5 mg by mouth. 05/10/24   [provider]  esterified estrogens (MENEST) 0.625 MG tablet Take 0.625 mg by mouth daily.    [provider]  estradiol  (ESTRACE ) 0.01 % CREA vaginal cream Apply one pea-sized amount around the opening of the urethra daily for 30 days, then 3 times weekly moving forward. 08/09/24   Helon Kirsch A, PA-C  feeding supplement (ENSURE ENLIVE / ENSURE PLUS) LIQD Take 237 mLs by mouth 2 (two) times daily between meals. 01/26/24   Josette Ade, MD  folic acid (FOLVITE) 1 MG tablet Take 1 mg by mouth daily.    [provider]  gabapentin (NEURONTIN) 100 MG capsule Take 100 mg by mouth 3 (three) times daily. 07/22/24   [provider]   hydrocortisone  2.5 % ointment Apply topically 2 (two) times daily. Apply to aa's rash BID PRN. 12/14/23   Smith, Collin-Jamal, MD  hydroxychloroquine (PLAQUENIL) 200 MG tablet HYDROXYCHLOROQUINE SULFATE 200 MG TABS 01/29/24   [provider]  Iron -Vitamins (GERITOL PO) Take 1 tablet by mouth daily.    [provider]  ketoconazole (NIZORAL) 2 % cream Apply 0.25 grams to affected areas and 1 inch around twice daily 12/14/23   [provider]  latanoprost  (XALATAN ) 0.005 % ophthalmic solution Place 1 drop into both eyes at bedtime.    [provider]  lisinopril-hydrochlorothiazide (ZESTORETIC) 10-12.5 MG tablet 1 tablet Orally Once a day; Duration: 30 day(s)    [provider]  LORazepam (ATIVAN) 0.5  MG tablet 2 tablets Orally Once a day as needed    [provider]  losartan  (COZAAR ) 100 MG tablet Take 100 mg by mouth daily. 09/09/23   [provider]  methotrexate (RHEUMATREX) 2.5 MG tablet Take 10 mg by mouth. 03/16/24   [provider]  metroNIDAZOLE (METROCREAM) 0.75 % cream Apply topically for rash twice daily. 03/19/24   [provider]  Multiple Vitamins-Minerals (ZINC  PO) Take 1 tablet by mouth daily.    [provider]  naproxen  (NAPROSYN ) 500 MG tablet Take 1 tablet (500 mg total) by mouth 2 (two) times daily. 07/04/24   Myriam Fonda RAMAN, PA-C  nystatin (MYCOSTATIN) 100000 UNIT/ML suspension SWISH AND SPIT USING BY MOUTH FOUR TIMES DAILY. CONTINUE FOR TWO DAYS AFTER SYMPTOMS ARE GONE    [provider]  oxyCODONE  (OXY IR/ROXICODONE ) 5 MG immediate release tablet OXYCODONE  HCL 5 MG TABS 07/25/24   [provider]  pantoprazole  (PROTONIX ) 40 MG tablet Take 40 mg by mouth daily.     [provider]  permethrin  (ELIMITE ) 5 % cream APPLY EVERYWHERE FROM THE NECK DOWN. LET SIT OVERNIGHT AND WASH OFF IN THE MORNING. REPEAT IN ONE WEEK    [provider]  polyethylene glycol  (MIRALAX  / GLYCOLAX ) 17 g packet Take 17 g by mouth daily. 10/09/23   Teresa Lonni HERO, MD  predniSONE  (DELTASONE ) 10 MG tablet Take 20 mg by mouth. 03/15/24   [provider]  SIMBRINZA 1-0.2 % SUSP  01/06/24   [provider]  triamcinolone  ointment (KENALOG ) 0.1 % Apply the medication twice daily (5 grams) to affected areas of the skin until smooth. 02/01/24   [provider]  valACYclovir (VALTREX) 1000 MG tablet Take one tablet by mouth twice daily for 7 days. 03/22/24   [provider]  venlafaxine  XR (EFFEXOR -XR) 75 MG 24 hr capsule venlafaxine  ER 75 mg capsule,extended release 24 hr    [provider]    Physical Exam: Vitals:   11/01/24 1011 11/01/24 1026 11/01/24 1143 11/01/24 1235  BP: 98/60  115/82 121/78  Pulse: 78  80 78  Resp: 20  20 20   Temp: 98.1 F (36.7 C)  98 F (36.7 C)   TempSrc: Oral     SpO2: 98%  92% 99%  Weight:  46.7 kg    Height:  5' (1.524 m)      General: alert and oriented to time, place, and person. Appear in mild distress, affect appropriate Eyes: PERRLA, Conjunctiva normal ENT: Oral Mucosa Clear, moist  Neck: NO JVD, NO Abnormal Mass Or lumps Cardiovascular: S1 and S2 Present, NO Murmur, peripheral pulses symmetrical Respiratory: good respiratory effort, Bilateral Air entry equal and Decreased, no signs of accessory muscle use,  B/L Crackles, NO wheezes Abdomen: Bowel Sound PREsent, Soft and NO tenderness Skin: no rashes  Extremities: NO Pedal edema, NO calf tenderness Neurologic: without any new focal findings Gait not checked due to patient safety concerns  Data Reviewed: I have personally reviewed and interpreted labs, imaging as discussed below.  CBC: Recent Labs  Lab 11/01/24 1013  WBC 19.3*  HGB 11.7*  HCT 34.4*  MCV 88.2  PLT 243   Basic Metabolic Panel: Recent Labs  Lab 11/01/24 1013  NA 132*  K 3.0*  CL 95*  CO2 25  GLUCOSE 80  BUN 12  CREATININE 0.72  CALCIUM  8.8*  MG  2.2   GFR: Estimated Creatinine Clearance: 41.6 mL/min (by C-G formula based on SCr of 0.72  mg/dL). Liver Function Tests: No results for input(s): AST, ALT, ALKPHOS, BILITOT, PROT, ALBUMIN in the last 168 hours. No results for input(s): LIPASE, AMYLASE in the last 168 hours. No results for input(s): AMMONIA in the last 168 hours. Coagulation Profile: No results for input(s): INR, PROTIME in the last 168 hours. Cardiac Enzymes: No results for input(s): CKTOTAL, CKMB, CKMBINDEX, TROPONINI in the last 168 hours. BNP (last 3 results) No results for input(s): PROBNP in the last 8760 hours. HbA1C: No results for input(s): HGBA1C in the last 72 hours. CBG: No results for input(s): GLUCAP in the last 168 hours. Lipid Profile: No results for input(s): CHOL, HDL, LDLCALC, TRIG, CHOLHDL, LDLDIRECT in the last 72 hours. Thyroid  Function Tests: No results for input(s): TSH, T4TOTAL, FREET4, T3FREE, THYROIDAB in the last 72 hours. Anemia Panel: No results for input(s): VITAMINB12, FOLATE, FERRITIN, TIBC, IRON , RETICCTPCT in the last 72 hours. Urine analysis:    Component Value Date/Time   COLORURINE YELLOW (A) 01/25/2024 1835   APPEARANCEUR Clear 08/04/2024 1611   LABSPEC 1.005 01/25/2024 1835   PHURINE 7.0 01/25/2024 1835   GLUCOSEU Negative 08/04/2024 1611   HGBUR NEGATIVE 01/25/2024 1835   BILIRUBINUR Negative 08/04/2024 1611   KETONESUR NEGATIVE 01/25/2024 1835   PROTEINUR Negative 08/04/2024 1611   PROTEINUR NEGATIVE 01/25/2024 1835   NITRITE Negative 08/04/2024 1611   NITRITE POSITIVE (A) 01/25/2024 1835   LEUKOCYTESUR Trace (A) 08/04/2024 1611   LEUKOCYTESUR SMALL (A) 01/25/2024 1835    Radiological Exams on Admission: DG Chest 2 View Result Date: 11/01/2024 CLINICAL DATA:  Worsening flu symptoms.  Chest pain. EXAM: DG CHEST 2V COMPARISON:  06/12/2021 FINDINGS: Lungs are adequately inflated demonstrate patchy  hazy airspace opacification over the right upper lobe, right base and left mid to lower lung suggesting multifocal pneumonia. No effusion. Cardiomediastinal silhouette and remainder of the exam is unchanged. IMPRESSION: Patchy hazy airspace process over the right upper lobe, right base and left mid to lower lung suggesting multifocal pneumonia. Electronically Signed   By: Toribio Agreste M.D.   On: 11/01/2024 11:22    EKG: Independently reviewed. normal EKG, normal sinus rhythm, nonspecific ST and T waves changes, prolonged QT interval.   I reviewed all nursing notes, pharmacy notes, vitals, pertinent old records.  Assessment/Plan Principal Problem:   CAP (community acquired pneumonia)   # Influenza A pneumonia # CAP (community acquired pneumonia) possible bacterial superinfection Rapid influenza test was positive on 1/2, patient was prescribed Tamiflu  Continue Tamiflu  for 2 more days S/p ceftriaxone  and azithromycin  given in the ED Continue ceftriaxone  1 g IV daily for total 5 days Started doxycycline  due to QTc prolong  continue same due to Mucinex  600 mg p.o. twice daily Tussionex as needed As needed meds for pain control  Hypokalemia, potassium repleted. Monitor electrolytes daily and replete elevated   HTN, HLD Continue losartan  Hold off statin for now due to generalized body ache  Depression and dementia Continued home meds, patient is on multiple medications, polypharmacy, recommend to follow with PCP to reduce polypharmacy as an outpatient   Nutrition: Cardiac diet DVT Prophylaxis: Subcutaneous Lovenox   Advance goals of care discussion: Full code   Consults: None  Family Communication: family was not present at bedside, at the time of interview.  Opportunity was given to ask question and all questions were answered satisfactorily.  Disposition: Admitted as observation, telemetry unit. Likely to be discharged home, in 1-2 days when stable.  I have discussed plan  of care as described above with RN  and patient/family.  Severity of Illness: The appropriate patient status for this patient is OBSERVATION. Observation status is judged to be reasonable and necessary in order to provide the required intensity of service to ensure the patient's safety. The patient's presenting symptoms, physical exam findings, and initial radiographic and laboratory data in the context of their medical condition is felt to place them at decreased risk for further clinical deterioration. Furthermore, it is anticipated that the patient will be medically stable for discharge from the hospital within 2 midnights of admission.    Author: ELVAN SOR, MD [facility] 11/01/2024 1:00 PM   To reach On-call, see care teams to locate the attending and reach out to them via [email or web address]. If 7PM-7AM, please contact night-coverage If you still have difficulty reaching the attending provider, please page the [facility] (Director on Call) for [facility] on amion for assistance.     [1]  Allergies Allergen Reactions   Ceftriaxone  Dermatitis   Cephalexin  Dermatitis   Sulfamethoxazole-Trimethoprim Dermatitis    Other Reaction(s): felt shaky while taking   Gramineae Pollens Other (See Comments)    Other Reaction(s): Other (See Comments)   Doxycycline  Nausea Only and Palpitations    Shaky   Hydroxychloroquine Dermatitis   Sulfa Antibiotics Nausea Only    Shaking

## 2024-11-01 NOTE — ED Notes (Signed)
 This NT changed pts soiled brief and clothes. Pt now clean and resting comfortably in bed.

## 2024-11-01 NOTE — ED Provider Notes (Signed)
 "  [facility] Provider Note    Event Date/Time   First MD Initiated Contact with Patient 11/01/24 1024     (approximate)   History   Influenza   HPI  Cheryl [name] is a 79 y.o. female who presents to the emergency department today with concern for worsening symptoms in the setting of recent influenza diagnosis.  Patient states she was diagnosed with the flu 4 days ago.  Was discharged with medication which she states she has been taking.  However since then she feels like she is getting worse.  She reports increasing fatigue, shortness of breath and cough. Says she has history of COPD.     Physical Exam   Triage Vital Signs: ED Triage Vitals  Encounter Vitals Group     BP 11/01/24 1011 98/60     Girls Systolic BP Percentile --      Girls Diastolic BP Percentile --      Boys Systolic BP Percentile --      Boys Diastolic BP Percentile --      Pulse Rate 11/01/24 1011 78     Resp 11/01/24 1011 20     Temp 11/01/24 1011 98.1 F (36.7 C)     Temp Source 11/01/24 1011 Oral     SpO2 11/01/24 1011 98 %     Weight 11/01/24 1026 102 lb 15.3 oz (46.7 kg)     Height 11/01/24 1026 5' (1.524 m)     Head Circumference --      Peak Flow --      Pain Score 11/01/24 1012 6     Pain Loc --      Pain Education --      Exclude from Growth Chart --     Most recent vital signs: Vitals:   11/01/24 1011  BP: 98/60  Pulse: 78  Resp: 20  Temp: 98.1 F (36.7 C)  SpO2: 98%   General: Awake, alert, oriented. CV:  Good peripheral perfusion. Regular rate and rhythm. Resp:  Normal effort. Lungs clear. Abd:  No distention.    ED Results / Procedures / Treatments   Labs (all labs ordered are listed, but only abnormal results are displayed) Labs Reviewed  BASIC METABOLIC PANEL WITH GFR - Abnormal; Notable for the following components:      Result Value   Sodium 132 (*)    Potassium 3.0 (*)    Chloride 95 (*)    Calcium  8.8 (*)    All other components  within normal limits  CBC - Abnormal; Notable for the following components:   WBC 19.3 (*)    Hemoglobin 11.7 (*)    HCT 34.4 (*)    RDW 16.8 (*)    All other components within normal limits  CULTURE, BLOOD (ROUTINE X 2)  CULTURE, BLOOD (ROUTINE X 2)  MAGNESIUM  LACTIC ACID, PLASMA  URINALYSIS, ROUTINE W REFLEX MICROSCOPIC  TROPONIN T, HIGH SENSITIVITY  TROPONIN T, HIGH SENSITIVITY     EKG  I, Guadalupe Eagles, attending physician, personally viewed and interpreted this EKG  EKG Time: 1014 Rate: 92 Rhythm: normal sinus rhythm Axis: normal Intervals: qtc 504 QRS: narrow ST changes: no st elevation Impression: abnormal ekg   RADIOLOGY I independently interpreted and visualized the CXR. My interpretation: Bilateral opacities Radiology interpretation: IMPRESSION:  Patchy hazy airspace process over the right upper lobe, right base  and left mid to lower lung suggesting multifocal pneumonia.     PROCEDURES:  Critical Care performed:  Yes  CRITICAL CARE Performed by: Guadalupe Eagles   Total critical care time: 30 minutes  Critical care time was exclusive of separately billable procedures and treating other patients.  Critical care was necessary to treat or prevent imminent or life-threatening deterioration.  Critical care was time spent personally by me on the following activities: development of treatment plan with patient and/or surrogate as well as nursing, discussions with consultants, evaluation of patient's response to treatment, examination of patient, obtaining history from patient or surrogate, ordering and performing treatments and interventions, ordering and review of laboratory studies, ordering and review of radiographic studies, pulse oximetry and re-evaluation of patient's condition.   Procedures    MEDICATIONS ORDERED IN ED: Medications - No data to display   IMPRESSION / MDM / ASSESSMENT AND PLAN / ED COURSE  I reviewed the triage vital signs  and the nursing notes.                              Differential diagnosis includes, but is not limited to, viral illness, pneumonia, anemia, dehydration  Patient's presentation is most consistent with acute presentation with potential threat to life or bodily function.   Patient presented to the emergency department today with concerns for continued and worsening symptoms after being diagnosed with the flu.  Patient's blood work here is concerning for hyponatremia hypokalemia.  I do think she is likely dehydrated and said worsening oral intake.  Chest x-ray here is concerning for pneumonia.  Blood work also shows significant leukocytosis.  Will start patient on IV antibiotics and fluids.  Discussed findings with patient.  Will discuss with hospitalist service for admission.     FINAL CLINICAL IMPRESSION(S) / ED DIAGNOSES   Final diagnoses:  Pneumonia due to infectious organism, unspecified laterality, unspecified part of lung       Note:  This document was prepared using Dragon voice recognition software and may include unintentional dictation errors.'    Eagles Guadalupe, MD 11/01/24 1452  "

## 2024-11-01 NOTE — ED Triage Notes (Addendum)
 Pt ED via [facility] from home. Pt reports tested Flu + on 10/28/24 and has been feeling increasingly fatigued. Denies N/V/D. Pt does state increasing SOB and CP since being dx with Flu   EMS VS:  139/82 98.7 oral HR 89 96% RA

## 2024-11-01 NOTE — ED Notes (Signed)
 States she is having a hard time breathing   Placed on o2 at 2 liters and repositioned in the bed  Pt ia bale to speak full sentences

## 2024-11-01 NOTE — ED Notes (Signed)
 Pt given warm blanket and tissue as requested

## 2024-11-01 NOTE — ED Notes (Signed)
 See triage note  Presents with SOB and more fatigue since beng dx'd with the flu Afebrile on arrival

## 2024-11-02 DIAGNOSIS — L309 Dermatitis, unspecified: Secondary | ICD-10-CM | POA: Diagnosis present

## 2024-11-02 DIAGNOSIS — J1001 Influenza due to other identified influenza virus with the same other identified influenza virus pneumonia: Secondary | ICD-10-CM | POA: Diagnosis present

## 2024-11-02 DIAGNOSIS — E871 Hypo-osmolality and hyponatremia: Secondary | ICD-10-CM | POA: Diagnosis present

## 2024-11-02 DIAGNOSIS — D72829 Elevated white blood cell count, unspecified: Secondary | ICD-10-CM | POA: Diagnosis not present

## 2024-11-02 DIAGNOSIS — E785 Hyperlipidemia, unspecified: Secondary | ICD-10-CM | POA: Diagnosis present

## 2024-11-02 DIAGNOSIS — D509 Iron deficiency anemia, unspecified: Secondary | ICD-10-CM | POA: Diagnosis present

## 2024-11-02 DIAGNOSIS — E876 Hypokalemia: Secondary | ICD-10-CM | POA: Diagnosis present

## 2024-11-02 DIAGNOSIS — M069 Rheumatoid arthritis, unspecified: Secondary | ICD-10-CM | POA: Diagnosis present

## 2024-11-02 DIAGNOSIS — J189 Pneumonia, unspecified organism: Secondary | ICD-10-CM | POA: Diagnosis present

## 2024-11-02 DIAGNOSIS — J440 Chronic obstructive pulmonary disease with acute lower respiratory infection: Secondary | ICD-10-CM | POA: Diagnosis present

## 2024-11-02 DIAGNOSIS — J9601 Acute respiratory failure with hypoxia: Secondary | ICD-10-CM | POA: Diagnosis present

## 2024-11-02 DIAGNOSIS — I10 Essential (primary) hypertension: Secondary | ICD-10-CM | POA: Diagnosis present

## 2024-11-02 DIAGNOSIS — Z825 Family history of asthma and other chronic lower respiratory diseases: Secondary | ICD-10-CM | POA: Diagnosis not present

## 2024-11-02 DIAGNOSIS — Z6823 Body mass index (BMI) 23.0-23.9, adult: Secondary | ICD-10-CM | POA: Diagnosis not present

## 2024-11-02 DIAGNOSIS — J188 Other pneumonia, unspecified organism: Secondary | ICD-10-CM

## 2024-11-02 DIAGNOSIS — F17210 Nicotine dependence, cigarettes, uncomplicated: Secondary | ICD-10-CM | POA: Diagnosis present

## 2024-11-02 DIAGNOSIS — Z833 Family history of diabetes mellitus: Secondary | ICD-10-CM | POA: Diagnosis not present

## 2024-11-02 DIAGNOSIS — F419 Anxiety disorder, unspecified: Secondary | ICD-10-CM | POA: Diagnosis present

## 2024-11-02 DIAGNOSIS — Z8249 Family history of ischemic heart disease and other diseases of the circulatory system: Secondary | ICD-10-CM | POA: Diagnosis not present

## 2024-11-02 DIAGNOSIS — E440 Moderate protein-calorie malnutrition: Secondary | ICD-10-CM | POA: Diagnosis present

## 2024-11-02 DIAGNOSIS — Z90710 Acquired absence of both cervix and uterus: Secondary | ICD-10-CM | POA: Diagnosis not present

## 2024-11-02 DIAGNOSIS — Z79890 Hormone replacement therapy (postmenopausal): Secondary | ICD-10-CM | POA: Diagnosis not present

## 2024-11-02 DIAGNOSIS — Z7952 Long term (current) use of systemic steroids: Secondary | ICD-10-CM | POA: Diagnosis not present

## 2024-11-02 DIAGNOSIS — F0393 Unspecified dementia, unspecified severity, with mood disturbance: Secondary | ICD-10-CM | POA: Diagnosis present

## 2024-11-02 DIAGNOSIS — Z7983 Long term (current) use of bisphosphonates: Secondary | ICD-10-CM | POA: Diagnosis not present

## 2024-11-02 DIAGNOSIS — F32A Depression, unspecified: Secondary | ICD-10-CM | POA: Diagnosis present

## 2024-11-02 LAB — BASIC METABOLIC PANEL WITH GFR
Anion gap: 11 (ref 5–15)
BUN: 9 mg/dL (ref 8–23)
CO2: 24 mmol/L (ref 22–32)
Calcium: 9 mg/dL (ref 8.9–10.3)
Chloride: 103 mmol/L (ref 98–111)
Creatinine, Ser: 0.59 mg/dL (ref 0.44–1.00)
GFR, Estimated: >60 mL/min
Glucose, Bld: 68 mg/dL — ABNORMAL LOW (ref 70–99)
Potassium: 3.8 mmol/L (ref 3.5–5.1)
Sodium: 137 mmol/L (ref 135–145)

## 2024-11-02 LAB — CBC
HCT: 34 % — ABNORMAL LOW (ref 36.0–46.0)
Hemoglobin: 11.4 g/dL — ABNORMAL LOW (ref 12.0–15.0)
MCH: 29.8 pg (ref 26.0–34.0)
MCHC: 33.5 g/dL (ref 30.0–36.0)
MCV: 88.8 fL (ref 80.0–100.0)
Platelets: 252 K/uL (ref 150–400)
RBC: 3.83 MIL/uL — ABNORMAL LOW (ref 3.87–5.11)
RDW: 17.2 % — ABNORMAL HIGH (ref 11.5–15.5)
WBC: 16.2 K/uL — ABNORMAL HIGH (ref 4.0–10.5)
nRBC: 0 % (ref 0.0–0.2)

## 2024-11-02 LAB — PHOSPHORUS: Phosphorus: 3.1 mg/dL (ref 2.5–4.6)

## 2024-11-02 LAB — MAGNESIUM: Magnesium: 2.2 mg/dL (ref 1.7–2.4)

## 2024-11-02 LAB — PROCALCITONIN: Procalcitonin: 0.29 ng/mL

## 2024-11-02 MED ORDER — FERROUS SULFATE 325 (65 FE) MG PO TABS
325.0000 mg | ORAL_TABLET | Freq: Every day | ORAL | Status: DC
  Administered 2024-11-03 – 2024-11-10 (×8): 325 mg via ORAL
  Filled 2024-11-02 (×8): qty 1

## 2024-11-02 MED ORDER — DONEPEZIL HCL 5 MG PO TABS
10.0000 mg | ORAL_TABLET | Freq: Every day | ORAL | Status: DC
  Administered 2024-11-02 – 2024-11-09 (×8): 10 mg via ORAL
  Filled 2024-11-02 (×8): qty 2

## 2024-11-02 MED ORDER — IPRATROPIUM-ALBUTEROL 0.5-2.5 (3) MG/3ML IN SOLN
3.0000 mL | RESPIRATORY_TRACT | Status: DC | PRN
  Administered 2024-11-02 – 2024-11-09 (×8): 3 mL via RESPIRATORY_TRACT
  Filled 2024-11-02 (×9): qty 3

## 2024-11-02 MED ORDER — IRON SUCROSE 300 MG IVPB - SIMPLE MED
300.0000 mg | Freq: Once | Status: AC
  Administered 2024-11-02: 300 mg via INTRAVENOUS
  Filled 2024-11-02: qty 300
  Filled 2024-11-02: qty 265

## 2024-11-02 MED ORDER — METHYLPREDNISOLONE SODIUM SUCC 125 MG IJ SOLR
125.0000 mg | Freq: Once | INTRAMUSCULAR | Status: AC
  Administered 2024-11-02: 125 mg via INTRAVENOUS
  Filled 2024-11-02: qty 2

## 2024-11-02 NOTE — Plan of Care (Signed)

## 2024-11-02 NOTE — Care Management Obs Status (Signed)
 MEDICARE OBSERVATION STATUS NOTIFICATION   Patient Details  Name: Cheryl [name] MRN: [id number] Date of Birth: 04/14/1946   Medicare Observation Status Notification Given:  Yes    Mamye Bolds W, CMA 11/02/2024, 11:19 AM

## 2024-11-02 NOTE — TOC Initial Note (Signed)
 Transition of Care [facility]) - Initial/Assessment Note    Patient Details  Name: Cheryl [name] MRN: [id number] Date of Birth: 1946/01/09  Transition of Care [facility]) CM/SW Contact:    Cheryl JAYSON Perfect, RN Phone Number: 11/02/2024, 11:32 AM  Clinical Narrative:    RN met with patient and her daughter, Cheryl [name], in room.  Patient lives with 2 daughters who are not present at this time.  Cheryl [name] states patient moved from her [place] ) home last November 2025 and moved into her other daughters home.  Cheryl [name] reports she and her sisters do not get along well and basically not talking to them currently.  Cheryl [name] is POA and HCPOA.  Patient alert and reports she is doing well in that home, but does not have a room and currently sleeping on the couch, but is ok with that and comfortable and wants to go back there.  States they are going to make a room for her soon.  Patient does have a PCP.  She does not drive.  Her daughters pick up her meds for her, no issues with paying for medications at this time.  She has a cane and walker in home, no falls in last 6 months. She does have on acute oxygen in room.  Rn will follow up for any homecare needs, oxygen.                 Expected Discharge Plan: Home/Self Care Barriers to Discharge: Continued Medical Work up   Patient Goals and CMS Choice Patient states their goals for this hospitalization and ongoing recovery are:: to return to her daughter's home          Expected Discharge Plan and Services   Discharge Planning Services: CM Consult                                          Prior Living Arrangements/Services   Lives with:: Adult Children Patient language and need for interpreter reviewed:: Yes Do you feel safe going back to the place where you live?: Yes      Need for Family Participation in Patient Care: Yes (Comment) Care giver support system in place?: Yes (comment)   Criminal Activity/Legal Involvement Pertinent to Current  Situation/Hospitalization: No - Comment as needed  Activities of Daily Living   ADL Screening (condition at time of admission) Independently performs ADLs?: Yes (appropriate for developmental age) Is the patient deaf or have difficulty hearing?: No Does the patient have difficulty seeing, even when wearing glasses/contacts?: No Does the patient have difficulty concentrating, remembering, or making decisions?: No  Permission Sought/Granted Permission sought to share information with : Family Supports                Emotional Assessment Appearance:: Appears stated age Attitude/Demeanor/Rapport: Engaged Affect (typically observed): Calm Orientation: : Oriented to Self, Oriented to Place, Oriented to  Time, Oriented to Situation Alcohol / Substance Use: Tobacco Use Psych Involvement: No (comment)  Admission diagnosis:  CAP (community acquired pneumonia) [J18.9] Pneumonia due to infectious organism, unspecified laterality, unspecified part of lung [J18.9] Patient Active Problem List   Diagnosis Date Noted   CAP (community acquired pneumonia) 11/01/2024   Acute metabolic encephalopathy 01/26/2024   Acute cystitis without hematuria 01/26/2024   Thrombocytopenia 01/26/2024   Hypoglycemia 01/26/2024   Hyponatremia 01/26/2024   Essential hypertension 01/25/2024   Depression 01/25/2024   Hyperlipidemia 01/25/2024  Pruritus 01/25/2024   Rectal prolapse 10/09/2023   Chronic diarrhea of unknown origin    Benign neoplasm of ascending colon    Benign neoplasm of cecum    Spinal stenosis, lumbar region, with neurogenic claudication 09/06/2015   DDD (degenerative disc disease), lumbar 03/30/2015   Lumbar facet arthropathy 03/30/2015   Lymphocytic colitis 03/27/2015   Degenerative lumbar disc 03/21/2015   Facet syndrome, lumbar 03/21/2015   Lumbar radiculopathy 03/21/2015   Sacroiliac joint dysfunction 03/21/2015   CD (contact dermatitis) 03/23/2014   Foot pain 03/23/2014    Fracture, stress, metatarsal 03/23/2014   PCP:  Tobie Domino, MD Pharmacy:   [facility] [address] [place] - [address] [place] [address] Phone: [phone] Fax: [phone]  [place] [facility] [address] [place] [address] Phone: [phone] Fax: [phone]  [facility] [address] [place] - [address] [place] [address] Phone: [phone] Fax: [phone]     Social Drivers of Health (SDOH) Social History: SDOH Screenings   Food Insecurity: No Food Insecurity (11/01/2024)  Housing: Low Risk (11/01/2024)  Transportation Needs: No Transportation Needs (11/01/2024)  Utilities: Not At Risk (11/01/2024)  Financial Resource Strain: Low Risk  (05/10/2024)   Received from [facility]  Social Connections: Unknown (11/01/2024)  Tobacco Use: High Risk (11/01/2024)   SDOH Interventions:     Readmission Risk Interventions     No data to display

## 2024-11-02 NOTE — Progress Notes (Addendum)
 " Progress Note   Patient: Cheryl [name] FMW:[id number] DOB: 1946-05-30 DOA: 11/01/2024     0 DOS: the patient was seen and examined on 11/02/2024   Brief hospital course: H&P HPI: [name] is a 79 y.o. female with Past medical history of HTN, HLD, depression, DJD, rheumatoid arthritis as reviewed from EMR, presented with complaining of having cough, shortness of breath and fatigue, generalized body ache.  As per patient she was positive for flu few days ago and she was given treatment for that but it is not helping and her condition was getting worse so she came into the ED.     ED Course: VS afebrile, HR 78, RR 20, BP 98/60, 98% on RA BMP sodium 122, hyponatremic, potassium 3.0, hypokalemia, chloride 95, calcium  8.8, rest within normal range CBC:WBC 19.3, Hb 11.7 CXR: Multifocal pneumonia EKG: Sinus rhythm, nonspecific STW changes QTc 504   Assessment and Plan: # Influenza A pneumonia # CAP (community acquired pneumonia)  possible bacterial superinfection Rapid influenza test was positive on 1/2, patient was prescribed Tamiflu  Continue Tamiflu  for 2 more days S/p ceftriaxone  and azithromycin  given in the ED Continue ceftriaxone  1 g IV daily for total 5 days Cont doxycycline  due to QTc prolong  continue same due to Mucinex  600 mg p.o. twice daily Tussionex as needed As needed meds for pain control Start steroids    Normocytic anemia Iron  deficiency    Latest Ref Rng & Units 11/02/2024    4:18 AM 11/01/2024   10:13 AM 01/26/2024    5:42 AM  CBC  WBC 4.0 - 10.5 K/uL 16.2  19.3  8.5   Hemoglobin 12.0 - 15.0 g/dL 88.5  88.2  85.7   Hematocrit 36.0 - 46.0 % 34.0  34.4  40.6   Platelets 150 - 400 K/uL 252  243  105   Iron /TIBC/Ferritin/ %Sat    Component Value Date/Time   IRON  19 (L) 11/01/2024 1528   TIBC 195 (L) 11/01/2024 1528   IRONPCTSAT 10 (L) 11/01/2024 1528   Hgb stable.  -Iron  supplement  Hypokalemia, Replace PRN      HTN, HLD Continue losartan  Resume  statin on discharge    Depression and dementia Continued home meds, patient is on multiple medications, polypharmacy, recommend to follow with PCP to reduce polypharmacy as an outpatient   Tobacco use disorder  TOC consulted Enxouraged cessation      Subjective: Continues to have some difficulty with breathing as well as a cough.   Physical Exam: Vitals:   11/02/24 0021 11/02/24 0330 11/02/24 0900 11/02/24 1223  BP: (!) 127/53 (!) 150/78 (!) 187/81 124/62  Pulse: 79 93 92 90  Resp:   20 20  Temp: 98.3 F (36.8 C) 98.9 F (37.2 C) 98.3 F (36.8 C) 100.1 F (37.8 C)  TempSrc: Oral  Oral   SpO2: 96% 96% 90% 98%  Weight:      Height:       Physical Exam  Constitutional: In no distress.  Cardiovascular: Normal rate, regular rhythm. No lower extremity edema  Pulmonary: Non labored breathing on Economy, no wheezing or rales.   Abdominal: Soft. Non distended and non tender Musculoskeletal: Normal range of motion.     Neurological: Alert and oriented to person, place, and time. Non focal  Skin: Skin is warm and dry.   Data Reviewed:     Latest Ref Rng & Units 11/02/2024    4:18 AM 11/01/2024   10:13 AM 01/26/2024    5:42  AM  CBC  WBC 4.0 - 10.5 K/uL 16.2  19.3  8.5   Hemoglobin 12.0 - 15.0 g/dL 88.5  88.2  85.7   Hematocrit 36.0 - 46.0 % 34.0  34.4  40.6   Platelets 150 - 400 K/uL 252  243  105       Latest Ref Rng & Units 11/02/2024    4:18 AM 11/01/2024   10:13 AM 01/26/2024    5:42 AM  BMP  Glucose 70 - 99 mg/dL 68  80  80   BUN 8 - 23 mg/dL 9  12  10    Creatinine 0.44 - 1.00 mg/dL 9.40  9.27  9.35   Sodium 135 - 145 mmol/L 137  132  137   Potassium 3.5 - 5.1 mmol/L 3.8  3.0  3.6   Chloride 98 - 111 mmol/L 103  95  102   CO2 22 - 32 mmol/L 24  25  26    Calcium  8.9 - 10.3 mg/dL 9.0  8.8  8.6      Family Communication: Daughter at bedside  Disposition: Status is: Inpatient Remains inpatient appropriate because: O2 requirement   Planned Discharge Destination:  Home    Time spent: 35 minutes  Author: Alban Pepper, MD 11/02/2024 5:00 PM  For on call review [email or web address]  "

## 2024-11-03 DIAGNOSIS — J188 Other pneumonia, unspecified organism: Secondary | ICD-10-CM | POA: Diagnosis not present

## 2024-11-03 LAB — URINALYSIS, COMPLETE (UACMP) WITH MICROSCOPIC
Bilirubin Urine: NEGATIVE
Glucose, UA: NEGATIVE mg/dL
Hgb urine dipstick: NEGATIVE
Ketones, ur: NEGATIVE mg/dL
Nitrite: NEGATIVE
Protein, ur: NEGATIVE mg/dL
Specific Gravity, Urine: 1.008 (ref 1.005–1.030)
WBC, UA: >50 WBC/hpf (ref 0–5)
pH: 7 (ref 5.0–8.0)

## 2024-11-03 LAB — BASIC METABOLIC PANEL WITH GFR
Anion gap: 12 (ref 5–15)
BUN: 12 mg/dL (ref 8–23)
CO2: 22 mmol/L (ref 22–32)
Calcium: 9.5 mg/dL (ref 8.9–10.3)
Chloride: 102 mmol/L (ref 98–111)
Creatinine, Ser: 0.56 mg/dL (ref 0.44–1.00)
GFR, Estimated: >60 mL/min
Glucose, Bld: 106 mg/dL — ABNORMAL HIGH (ref 70–99)
Potassium: 4.6 mmol/L (ref 3.5–5.1)
Sodium: 136 mmol/L (ref 135–145)

## 2024-11-03 LAB — CBC WITH DIFFERENTIAL/PLATELET
Abs Immature Granulocytes: 0.85 K/uL — ABNORMAL HIGH (ref 0.00–0.07)
Basophils Absolute: 0.1 K/uL (ref 0.0–0.1)
Basophils Relative: 0 %
Eosinophils Absolute: 0 K/uL (ref 0.0–0.5)
Eosinophils Relative: 0 %
HCT: 31.7 % — ABNORMAL LOW (ref 36.0–46.0)
Hemoglobin: 10.7 g/dL — ABNORMAL LOW (ref 12.0–15.0)
Immature Granulocytes: 3 %
Lymphocytes Relative: 5 %
Lymphs Abs: 1.2 K/uL (ref 0.7–4.0)
MCH: 30.2 pg (ref 26.0–34.0)
MCHC: 33.8 g/dL (ref 30.0–36.0)
MCV: 89.5 fL (ref 80.0–100.0)
Monocytes Absolute: 1 K/uL (ref 0.1–1.0)
Monocytes Relative: 4 %
Neutro Abs: 23.6 K/uL — ABNORMAL HIGH (ref 1.7–7.7)
Neutrophils Relative %: 88 %
Platelets: 252 K/uL (ref 150–400)
RBC: 3.54 MIL/uL — ABNORMAL LOW (ref 3.87–5.11)
RDW: 17 % — ABNORMAL HIGH (ref 11.5–15.5)
Smear Review: NORMAL
WBC: 26.8 K/uL — ABNORMAL HIGH (ref 4.0–10.5)
nRBC: 0 % (ref 0.0–0.2)

## 2024-11-03 LAB — CK: Total CK: 52 U/L (ref 38–234)

## 2024-11-03 MED ORDER — TIMOLOL MALEATE 0.5 % OP SOLN
1.0000 [drp] | Freq: Two times a day (BID) | OPHTHALMIC | Status: DC
  Administered 2024-11-03 – 2024-11-10 (×13): 1 [drp] via OPHTHALMIC
  Filled 2024-11-03: qty 5

## 2024-11-03 MED ORDER — PREDNISONE 20 MG PO TABS
40.0000 mg | ORAL_TABLET | Freq: Every day | ORAL | Status: DC
  Administered 2024-11-03 – 2024-11-06 (×4): 40 mg via ORAL
  Filled 2024-11-03 (×4): qty 2

## 2024-11-03 MED ORDER — IPRATROPIUM-ALBUTEROL 0.5-2.5 (3) MG/3ML IN SOLN
3.0000 mL | Freq: Once | RESPIRATORY_TRACT | Status: AC
  Administered 2024-11-03: 3 mL via RESPIRATORY_TRACT
  Filled 2024-11-03: qty 3

## 2024-11-03 MED ORDER — BRIMONIDINE TARTRATE 0.2 % OP SOLN
1.0000 [drp] | Freq: Two times a day (BID) | OPHTHALMIC | Status: DC
  Administered 2024-11-03 – 2024-11-10 (×13): 1 [drp] via OPHTHALMIC
  Filled 2024-11-03: qty 5

## 2024-11-03 MED ORDER — LATANOPROST 0.005 % OP SOLN
1.0000 [drp] | Freq: Every day | OPHTHALMIC | Status: DC
  Administered 2024-11-03 – 2024-11-09 (×6): 1 [drp] via OPHTHALMIC
  Filled 2024-11-03: qty 2.5

## 2024-11-03 MED ORDER — BRIMONIDINE TARTRATE-TIMOLOL 0.2-0.5 % OP SOLN: 1.0000 [drp] | Freq: Two times a day (BID) | OPHTHALMIC | Status: DC

## 2024-11-03 NOTE — Progress Notes (Signed)
 " Progress Note   Patient: Cheryl [name] FMW:[id number] DOB: 11-26-1945 DOA: 11/01/2024     1 DOS: the patient was seen and examined on 11/03/2024   Brief hospital course: H&P HPI: Cheryl [name] is a 79 y.o. female with Past medical history of HTN, HLD, depression, DJD, rheumatoid arthritis as reviewed from EMR, presented with complaining of having cough, shortness of breath and fatigue, generalized body ache.  As per patient she was positive for flu few days ago and she was given treatment for that but it is not helping and her condition was getting worse so she came into the ED.     ED Course: VS afebrile, HR 78, RR 20, BP 98/60, 98% on RA BMP sodium 122, hyponatremic, potassium 3.0, hypokalemia, chloride 95, calcium  8.8, rest within normal range CBC:WBC 19.3, Hb 11.7 CXR: Multifocal pneumonia EKG: Sinus rhythm, nonspecific STW changes QTc 504   Assessment and Plan: # Influenza A pneumonia # CAP (community acquired pneumonia)  possible bacterial superinfection Rapid influenza test was positive on 1/2, patient was prescribed Tamiflu  Continue Tamiflu  for 2 more days S/p ceftriaxone  and azithromycin  given in the ED Continue ceftriaxone  1 g IV daily for total 5 days Cont doxycycline  due to QTc prolong Mucinex  600 mg p.o. twice daily Tussionex as needed As needed meds for pain control Continue steroids   Leukocytosis likely worsened due to steroids Continue antibiotics.   Polyuria Possible UTI Continue antibiotics per above,  F/u Ucx   Normocytic anemia Iron  deficiency    Latest Ref Rng & Units 11/03/2024    5:48 AM 11/02/2024    4:18 AM 11/01/2024   10:13 AM  CBC  WBC 4.0 - 10.5 K/uL 26.8  16.2  19.3   Hemoglobin 12.0 - 15.0 g/dL 89.2  88.5  88.2   Hematocrit 36.0 - 46.0 % 31.7  34.0  34.4   Platelets 150 - 400 K/uL 252  252  243   Iron /TIBC/Ferritin/ %Sat    Component Value Date/Time   IRON  19 (L) 11/01/2024 1528   TIBC 195 (L) 11/01/2024 1528   IRONPCTSAT 10 (L)  11/01/2024 1528   Hgb stable.  -Iron  supplement  Hypokalemia, Replace PRN      HTN, HLD Continue losartan  Resume statin on discharge    Depression and dementia Continued home meds, patient is on multiple medications, polypharmacy, recommend to follow with PCP to reduce polypharmacy as an outpatient   Tobacco use disorder  TOC consulted Enxouraged cessation      Subjective: Cough is improved this AM.   Physical Exam: Vitals:   11/03/24 0019 11/03/24 0358 11/03/24 0813 11/03/24 1611  BP: (!) 148/125 (!) 166/139 (!) 171/86 (!) 185/78  Pulse: 82 79 86 74  Resp: 20 20 16 18   Temp: 98.1 F (36.7 C) 97.7 F (36.5 C) 98.7 F (37.1 C) 98.5 F (36.9 C)  TempSrc: Oral Axillary Oral Oral  SpO2: 100% 94% 96% 96%  Weight:      Height:        Constitutional: In no distress.  Cardiovascular: Normal rate, regular rhythm. No lower extremity edema  Pulmonary: Non labored breathing on , no wheezing or rales.   Abdominal: Soft. Non distended and non tender Musculoskeletal: Normal range of motion.     Neurological: Alert and oriented to person, place, and time. Non focal  Skin: Skin is warm and dry.    Data Reviewed:     Latest Ref Rng & Units 11/03/2024    5:48 AM 11/02/2024  4:18 AM 11/01/2024   10:13 AM  CBC  WBC 4.0 - 10.5 K/uL 26.8  16.2  19.3   Hemoglobin 12.0 - 15.0 g/dL 89.2  88.5  88.2   Hematocrit 36.0 - 46.0 % 31.7  34.0  34.4   Platelets 150 - 400 K/uL 252  252  243       Latest Ref Rng & Units 11/03/2024    5:48 AM 11/02/2024    4:18 AM 11/01/2024   10:13 AM  BMP  Glucose 70 - 99 mg/dL 893  68  80   BUN 8 - 23 mg/dL 12  9  12    Creatinine 0.44 - 1.00 mg/dL 9.43  9.40  9.27   Sodium 135 - 145 mmol/L 136  137  132   Potassium 3.5 - 5.1 mmol/L 4.6  3.8  3.0   Chloride 98 - 111 mmol/L 102  103  95   CO2 22 - 32 mmol/L 22  24  25    Calcium  8.9 - 10.3 mg/dL 9.5  9.0  8.8      Family Communication: Daughter at bedside  Disposition: Status is:  Inpatient Remains inpatient appropriate because: O2 requirement   Planned Discharge Destination: Home    Time spent: 35 minutes  Author: Alban Pepper, MD 11/03/2024 4:46 PM  For on call review [email or web address]  "

## 2024-11-03 NOTE — Evaluation (Signed)
 Physical Therapy Evaluation Patient Details Name: Cheryl [name] MRN: [id number] DOB: [birth year] Today's Date: 11/03/2024  History of Present Illness  Pt is a 79 yo female that presented to the ED for fatigue, SOB, generalized body ache, positive for flu a few days ago but felt she was worsening. PMH of HTN, HLD, depression, DJD, RA.  Clinical Impression  Patient alert, agreeable to PT, denied pain. At baseline the pt reported she lives with her daughters, uses a SPC. Bed mobility with supervision, sit <> stand with CGA from EOB And from commode, and ambulated ~38ft in total in room, CGA. Pt reaches for external support throughout, pt endorsed fatigue. Able to change underwear with supervision as well. Further mobility deferred due to incontinence and pt's fatigue.  Overall the patient demonstrated deficits (see PT Problem List) that impede the patient's functional abilities, safety, and mobility and would benefit from skilled PT intervention.          If plan is discharge home, recommend the following: A little help with walking and/or transfers;A little help with bathing/dressing/bathroom;Assistance with cooking/housework;Assist for transportation;Help with stairs or ramp for entrance   Can travel by private vehicle        Equipment Recommendations None recommended by PT  Recommendations for Other Services       Functional Status Assessment Patient has had a recent decline in their functional status and demonstrates the ability to make significant improvements in function in a reasonable and predictable amount of time.     Precautions / Restrictions Precautions Precautions: Fall Recall of Precautions/Restrictions: Intact Restrictions Weight Bearing Restrictions Per Provider Order: No      Mobility  Bed Mobility Overal bed mobility: Needs Assistance Bed Mobility: Supine to Sit, Sit to Supine     Supine to sit: Supervision, HOB elevated, Used rails Sit to supine:  Supervision        Transfers Overall transfer level: Needs assistance Equipment used: 1 person hand held assist, None Transfers: Sit to/from Stand Sit to Stand: Contact guard assist                Ambulation/Gait Ambulation/Gait assistance: Contact guard assist Gait Distance (Feet): 17 Feet Assistive device: Rolling walker (2 wheels)         General Gait Details: decreased, reaches for external support throughout  Stairs            Wheelchair Mobility     Tilt Bed    Modified Rankin (Stroke Patients Only)       Balance Overall balance assessment: Needs assistance Sitting-balance support: Feet supported Sitting balance-Leahy Scale: Good     Standing balance support: Single extremity supported, No upper extremity supported, Bilateral upper extremity supported, During functional activity Standing balance-Leahy Scale: Fair                               Pertinent Vitals/Pain Pain Assessment Pain Assessment: No/denies pain    Home Living Family/patient expects to be discharged to:: Private residence Living Arrangements: Children;Other relatives Available Help at Discharge: Family Type of Home: House Home Access: Level entry       Home Layout: One level Home Equipment: Cane - single point;Rollator (4 wheels) Additional Comments: use of SPC    Prior Function Prior Level of Function : Independent/Modified Independent                     Extremity/Trunk Assessment   Upper Extremity  Assessment Upper Extremity Assessment: Generalized weakness    Lower Extremity Assessment Lower Extremity Assessment: Generalized weakness       Communication        Cognition Arousal: Alert Behavior During Therapy: WFL for tasks assessed/performed   PT - Cognitive impairments: No apparent impairments                                 Cueing       General Comments      Exercises Other Exercises Other Exercises:  pericare with supervision, able to doff/don underwear on commode   Assessment/Plan    PT Assessment Patient needs continued PT services  PT Problem List Decreased strength;Decreased range of motion;Decreased activity tolerance;Decreased balance;Decreased mobility;Decreased knowledge of use of DME       PT Treatment Interventions DME instruction;Balance training;Gait training;Neuromuscular re-education;Stair training;Functional mobility training;Patient/family education;Therapeutic activities;Therapeutic exercise    PT Goals (Current goals can be found in the Care Plan section)  Acute Rehab PT Goals Patient Stated Goal: to feel better PT Goal Formulation: With patient Time For Goal Achievement: 11/17/24 Potential to Achieve Goals: Good    Frequency Min 2X/week     Co-evaluation               AM-PAC PT 6 Clicks Mobility  Outcome Measure Help needed turning from your back to your side while in a flat bed without using bedrails?: A Little Help needed moving from lying on your back to sitting on the side of a flat bed without using bedrails?: A Little Help needed moving to and from a bed to a chair (including a wheelchair)?: A Little Help needed standing up from a chair using your arms (e.g., wheelchair or bedside chair)?: A Little Help needed to walk in hospital room?: A Little Help needed climbing 3-5 steps with a railing? : A Little 6 Click Score: 18    End of Session Equipment Utilized During Treatment: Gait belt Activity Tolerance: Patient tolerated treatment well Patient left: in bed;with call bell/phone within reach;with bed alarm set Nurse Communication: Mobility status PT Visit Diagnosis: Other abnormalities of gait and mobility (R26.89);Difficulty in walking, not elsewhere classified (R26.2);Muscle weakness (generalized) (M62.81)    Time: 9142-9081 PT Time Calculation (min) (ACUTE ONLY): 21 min   Charges:   PT Evaluation $PT Eval Low Complexity: 1 Low PT  Treatments $Therapeutic Activity: 8-22 mins PT General Charges $$ ACUTE PT VISIT: 1 Visit         Doyal Shams PT, DPT 12:46 PM,11/03/2024

## 2024-11-03 NOTE — Evaluation (Signed)
 Occupational Therapy Evaluation Patient Details Name: Cheryl [name] MRN: [id number] DOB: [birth year] Today's Date: 11/03/2024   History of Present Illness   Pt is a 79 yo female that presented to the ED for fatigue, SOB, generalized body ache, positive for flu a few days ago but felt she was worsening. PMH of HTN, HLD, depression, DJD, RA.     Clinical Impressions Patient was seen for OT evaluation this date. Prior to hospital admission, patient was living with two daughters in single story home. Patient reports being able to manage ADLs without A but prefers to have daughters present for showers to ensure safety maintained. Patient on O2 2L while here, does not use O2 at home. Satting ~92% at rest and to transfer to recliner. Patient presents with poor problem solving, increased confusion and impaired safety awareness. OT notified RN/MD of confusion via secure chat. Patient is currently requiring supervision/CGA.  Patient would benefit from skilled OT services to address noted impairments and functional limitations (see below for any additional details) in order to maximize safety and independence while minimizing future risk of falls, injury, and readmission. Anticipate the need for follow up OT services upon acute hospital DC.      If plan is discharge home, recommend the following:   A little help with walking and/or transfers;A little help with bathing/dressing/bathroom;Assistance with cooking/housework;Supervision due to cognitive status     Functional Status Assessment   Patient has had a recent decline in their functional status and demonstrates the ability to make significant improvements in function in a reasonable and predictable amount of time.     Equipment Recommendations   BSC/3in1     Recommendations for Other Services         Precautions/Restrictions   Precautions Precautions: Fall Recall of Precautions/Restrictions: Impaired Restrictions Weight Bearing  Restrictions Per Provider Order: No     Mobility Bed Mobility Overal bed mobility: Needs Assistance Bed Mobility: Supine to Sit     Supine to sit: Supervision, HOB elevated, Used rails          Transfers Overall transfer level: Needs assistance Equipment used: 1 person hand held assist, None Transfers: Bed to chair/wheelchair/BSC Sit to Stand: Contact guard assist                  Balance Overall balance assessment: Needs assistance Sitting-balance support: Feet supported Sitting balance-Leahy Scale: Good     Standing balance support: Single extremity supported, No upper extremity supported, Bilateral upper extremity supported, During functional activity Standing balance-Leahy Scale: Fair                             ADL either performed or assessed with clinical judgement   ADL Overall ADL's : Needs assistance/impaired                                       General ADL Comments: demonstrates lack of of insight into deficits, recomending supervision to ensure safety maintained during ADL performance. educated on energy conservation techniques with fair teach back     Vision         Perception         Praxis         Pertinent Vitals/Pain Pain Assessment Pain Assessment: 0-10 Pain Score: 3  Pain Location: back Pain Descriptors / Indicators: Aching Pain Intervention(s): Premedicated before session  Extremity/Trunk Assessment Upper Extremity Assessment Upper Extremity Assessment: Generalized weakness   Lower Extremity Assessment Lower Extremity Assessment: Generalized weakness       Communication Communication Communication: No apparent difficulties   Cognition Arousal: Alert Behavior During Therapy: WFL for tasks assessed/performed Cognition: Cognition impaired   Orientation impairments: Time, Situation Awareness: Intellectual awareness impaired     Executive functioning impairment (select all  impairments): Reasoning, Problem solving OT - Cognition Comments: contiinues to report confusion throughout eval, disoriented to month (continues to state it's July 2025)                 Following commands: Intact       Cueing  General Comments   Cueing Techniques: Verbal cues      Exercises     Shoulder Instructions      Home Living Family/patient expects to be discharged to:: Private residence Living Arrangements: Children;Other relatives Available Help at Discharge: Family Type of Home: House Home Access: Level entry     Home Layout: One level     Bathroom Shower/Tub: Chief Strategy Officer: Standard Bathroom Accessibility: Yes How Accessible: Accessible via walker Home Equipment: Cane - single point;Rollator (4 wheels);Shower seat   Additional Comments: use of SPC      Prior Functioning/Environment Prior Level of Function : Independent/Modified Independent             Mobility Comments: intermittently uses cane and/or furniture walks ADLs Comments: showers with supervision from daughters, able to manage all components    OT Problem List: Decreased strength;Decreased activity tolerance;Impaired balance (sitting and/or standing);Decreased safety awareness   OT Treatment/Interventions: Self-care/ADL training;Energy conservation;Therapeutic activities      OT Goals(Current goals can be found in the care plan section)   Acute Rehab OT Goals Patient Stated Goal: to go home OT Goal Formulation: With patient Time For Goal Achievement: 11/17/24 Potential to Achieve Goals: Good ADL Goals Pt Will Perform Grooming: with supervision;standing Pt Will Perform Lower Body Dressing: with supervision;sit to/from stand Pt Will Transfer to Toilet: with supervision;ambulating;bedside commode   OT Frequency:  Min 2X/week    Co-evaluation              AM-PAC OT 6 Clicks Daily Activity     Outcome Measure Help from another person eating  meals?: None Help from another person taking care of personal grooming?: None Help from another person toileting, which includes using toliet, bedpan, or urinal?: A Little Help from another person bathing (including washing, rinsing, drying)?: A Little Help from another person to put on and taking off regular upper body clothing?: None Help from another person to put on and taking off regular lower body clothing?: A Little 6 Click Score: 21   End of Session Equipment Utilized During Treatment: Oxygen Nurse Communication: Mobility status  Activity Tolerance: Patient tolerated treatment well Patient left: in chair;with call bell/phone within reach;with chair alarm set;with nursing/sitter in room  OT Visit Diagnosis: Unsteadiness on feet (R26.81);Muscle weakness (generalized) (M62.81)                Time: 8678-8653 OT Time Calculation (min): 25 min Charges:  OT General Charges $OT Visit: 1 Visit OT Evaluation $OT Eval Low Complexity: 1 Low OT Treatments $Self Care/Home Management : 8-22 mins  Rogers Clause, OT/L MSOT, 11/03/2024

## 2024-11-03 NOTE — Plan of Care (Signed)

## 2024-11-03 NOTE — TOC Progression Note (Signed)
 Transition of Care [facility]) - Progression Note    Patient Details  Name: Cheryl [name] MRN: [id number] Date of Birth: April 06, 1946  [facility]) CM/SW Contact  Dalia GORMAN Fuse, RN Phone Number: 11/03/2024, 10:24 AM  Clinical Narrative:     Patient is from and is remains inpt appropriate. Admitted with Flu A and PNA. ON IV ABX and Tamiflu . New O2 requirement of 4 L [place].  No TOC Needs. Please outreach to [facility] if home O2 is needed.   Expected Discharge Plan: Home/Self Care Barriers to Discharge: Continued Medical Work up               Expected Discharge Plan and Services   Discharge Planning Services: CM Consult                                           Social Drivers of Health (SDOH) Interventions SDOH Screenings   Food Insecurity: No Food Insecurity (11/01/2024)  Housing: Low Risk (11/01/2024)  Transportation Needs: No Transportation Needs (11/01/2024)  Utilities: Not At Risk (11/01/2024)  Financial Resource Strain: Low Risk  (05/10/2024)   Received from [facility]  Social Connections: Unknown (11/01/2024)  Tobacco Use: High Risk (11/01/2024)    Readmission Risk Interventions     No data to display

## 2024-11-03 NOTE — Evaluation (Signed)
 Clinical/Bedside Swallow Evaluation Patient Details  Name: Cheryl [name] MRN: [id number] Date of Birth: 1946-10-10  Today's Date: 11/03/2024 Time: SLP Start Time (ACUTE ONLY): 0825 SLP Stop Time (ACUTE ONLY): 0850 SLP Time Calculation (min) (ACUTE ONLY): 25 min  Past Medical History:  Past Medical History:  Diagnosis Date   Abdominal aneurysm    Allergy    seasonal   Anxiety    Arthritis    hips   Cataract    Complication of anesthesia    facial swelling after hysterectomy   Depression    Gastric ulcer 10/27/2010   Hypertension    Hypokalemia    Hyponatremia    Lymphocytic colitis    Osteopenia    Peptic ulcer    Piriformis syndrome    Pneumonia    Pre-diabetes    Radiculopathy of lumbar region    Ulcerative colitis (HCC)    Vertigo    with sinus issues   Wears dentures    partial upper   Past Surgical History:  Past Surgical History:  Procedure Laterality Date   ABDOMINAL HYSTERECTOMY     APPENDECTOMY     BACK SURGERY     BREAST BIOPSY Left 2012   negative   CATARACT EXTRACTION, BILATERAL Bilateral 2005   COLONOSCOPY  2012   COLONOSCOPY WITH PROPOFOL  N/A 10/11/2015   Procedure: COLONOSCOPY WITH PROPOFOL ;  Surgeon: Rogelia Copping, MD;  Location: [facility];  Service: Endoscopy;  Laterality: N/A;   COLONOSCOPY WITH PROPOFOL  N/A 05/24/2021   Procedure: COLONOSCOPY WITH PROPOFOL ;  Surgeon: Dessa Reyes ORN, MD;  Location: [facility] ENDOSCOPY;  Service: Gastroenterology;  Laterality: N/A;   ESOPHAGOGASTRODUODENOSCOPY N/A 05/24/2021   Procedure: ESOPHAGOGASTRODUODENOSCOPY (EGD);  Surgeon: Dessa Reyes ORN, MD;  Location: [facility] ENDOSCOPY;  Service: Gastroenterology;  Laterality: N/A;   POLYPECTOMY  10/11/2015   Procedure: POLYPECTOMY;  Surgeon: Rogelia Copping, MD;  Location: [facility];  Service: Endoscopy;;   XI ROBOT ASSISTED RECTOPEXY N/A 10/09/2023   Procedure: XI ROBOT ASSISTED SUTURE RECTOPEXY;  Surgeon: Teresa Lonni HERO, MD;  Location: [facility] ORS;   Service: General;  Laterality: N/A;   HPI:  per H&P, FREDA JAQUITH is a 79 y.o. female with Past medical history of HTN, HLD, depression, DJD, rheumatoid arthritis as reviewed from EMR, presented with complaining of having cough, shortness of breath and fatigue, generalized body ache.  As per patient she was positive for flu few days ago and she was given treatment for that but it is not helping and her condition was getting worse so she came into the ED. CXR: Patchy hazy airspace process over the right upper lobe, right base  and left mid to lower lung suggesting multifocal pneumonia.    Assessment / Plan / Recommendation  Clinical Impression  Pt seen for bedside swallow assessment in the setting of concern for multifocal PNA. No history of dysphagia reported, noted in chart, or indicated by RN. Pt currently on regular solids and thin liquids diet without issue- reports some pill dysphagia with larger pills chronically. Oral mech function intact. Pt seen with trials of thin liquids (via straw) and regular solids. No overt or subtle s/sx pharyngeal dysphagia noted. No change to vocal quality across trials. Oral phase grossly intact- with complete manipulation and clearance of regular solid from oral cavity.   Based on current debility and respiratory/pulmonary status, pt is at increased risk of aspiration, therefore recommend aspiration precautions (slow rate, small bites, elevated HOB, and alert for PO intake). Education shared with pt regarding  monitoring for shortness breath, endurance, and cough during PO intake in current deconditioned state. Continue with current unrestricted diet. Pills whole vs cut in thin liquids. Intermittent supervision to ensure compliance with aspiration precautions. MD and RN aware of recommendations. No further acute SLP services indicated.   SLP Visit Diagnosis: Dysphagia, unspecified (R13.10) (related to acute deconditioning, PNA, and flu)    Aspiration Risk  Mild  aspiration risk    Diet Recommendation   Thin;Age appropriate regular  Medication Administration: Whole meds with liquid    Other Recommendations Oral Care Recommendations: Oral care BID;Staff/trained caregiver to provide oral care      Functional Status Assessment Patient has not had a recent decline in their functional status    Swallow Study   General Date of Onset: 11/03/24 HPI: per H&P, Cheryl [name] is a 79 y.o. female with Past medical history of HTN, HLD, depression, DJD, rheumatoid arthritis as reviewed from EMR, presented with complaining of having cough, shortness of breath and fatigue, generalized body ache.  As per patient she was positive for flu few days ago and she was given treatment for that but it is not helping and her condition was getting worse so she came into the ED. CXR: Patchy hazy airspace process over the right upper lobe, right base  and left mid to lower lung suggesting multifocal pneumonia. Type of Study: Bedside Swallow Evaluation Previous Swallow Assessment: none in chart Diet Prior to this Study: Regular;Thin liquids (Level 0) Temperature Spikes Noted: Yes (100.1; WBC 26.8) Respiratory Status: Nasal cannula (2L) History of Recent Intubation: No Behavior/Cognition: Alert;Cooperative;Pleasant mood Oral Cavity Assessment: Within Functional Limits Oral Care Completed by SLP: Recent completion by staff Oral Cavity - Dentition: Missing dentition Vision: Functional for self-feeding Self-Feeding Abilities: Able to feed self Patient Positioning: Upright in bed Baseline Vocal Quality: Normal Volitional Cough: Strong;Congested Volitional Swallow: Able to elicit    Oral/Motor/Sensory Function Overall Oral Motor/Sensory Function: Within functional limits   Ice Chips Ice chips: Not tested   Thin Liquid Thin Liquid: Within functional limits Presentation: Straw    Nectar Thick Nectar Thick Liquid: Not tested   Honey Thick Honey Thick Liquid: Not tested    Puree Puree: Not tested   Solid     Solid: Within functional limits Presentation: Self Fed     Jamarie Joplin Clapp, MS, CCC-SLP Speech Language Pathologist Rehab Services; [facility] [phone] (ascom)   Kami Kube J Clapp 11/03/2024,9:45 AM

## 2024-11-04 ENCOUNTER — Inpatient Hospital Stay

## 2024-11-04 DIAGNOSIS — J188 Other pneumonia, unspecified organism: Secondary | ICD-10-CM | POA: Diagnosis not present

## 2024-11-04 LAB — CBC WITH DIFFERENTIAL/PLATELET
Abs Immature Granulocytes: 0.96 K/uL — ABNORMAL HIGH (ref 0.00–0.07)
Basophils Absolute: 0.1 K/uL (ref 0.0–0.1)
Basophils Relative: 1 %
Eosinophils Absolute: 0 K/uL (ref 0.0–0.5)
Eosinophils Relative: 0 %
HCT: 31.2 % — ABNORMAL LOW (ref 36.0–46.0)
Hemoglobin: 11 g/dL — ABNORMAL LOW (ref 12.0–15.0)
Immature Granulocytes: 3 %
Lymphocytes Relative: 6 %
Lymphs Abs: 1.7 K/uL (ref 0.7–4.0)
MCH: 30.5 pg (ref 26.0–34.0)
MCHC: 35.3 g/dL (ref 30.0–36.0)
MCV: 86.4 fL (ref 80.0–100.0)
Monocytes Absolute: 1.2 K/uL — ABNORMAL HIGH (ref 0.1–1.0)
Monocytes Relative: 4 %
Neutro Abs: 26.1 K/uL — ABNORMAL HIGH (ref 1.7–7.7)
Neutrophils Relative %: 86 %
Platelets: 272 K/uL (ref 150–400)
RBC: 3.61 MIL/uL — ABNORMAL LOW (ref 3.87–5.11)
RDW: 17.1 % — ABNORMAL HIGH (ref 11.5–15.5)
Smear Review: NORMAL
WBC: 30.1 K/uL — ABNORMAL HIGH (ref 4.0–10.5)
nRBC: 0 % (ref 0.0–0.2)

## 2024-11-04 LAB — EXPECTORATED SPUTUM ASSESSMENT W GRAM STAIN, RFLX TO RESP C

## 2024-11-04 LAB — BASIC METABOLIC PANEL WITH GFR
Anion gap: 11 (ref 5–15)
BUN: 11 mg/dL (ref 8–23)
CO2: 24 mmol/L (ref 22–32)
Calcium: 9 mg/dL (ref 8.9–10.3)
Chloride: 102 mmol/L (ref 98–111)
Creatinine, Ser: 0.46 mg/dL (ref 0.44–1.00)
GFR, Estimated: >60 mL/min
Glucose, Bld: 75 mg/dL (ref 70–99)
Potassium: 3.6 mmol/L (ref 3.5–5.1)
Sodium: 137 mmol/L (ref 135–145)

## 2024-11-04 LAB — D-DIMER, QUANTITATIVE: D-Dimer, Quant: 4.82 ug{FEU}/mL — ABNORMAL HIGH (ref 0.00–0.50)

## 2024-11-04 MED ORDER — IOHEXOL 350 MG/ML SOLN
75.0000 mL | Freq: Once | INTRAVENOUS | Status: AC | PRN
  Administered 2024-11-04: 75 mL via INTRAVENOUS

## 2024-11-04 MED ORDER — POLYETHYLENE GLYCOL 3350 17 G PO PACK
17.0000 g | PACK | Freq: Every day | ORAL | Status: DC
  Administered 2024-11-04 – 2024-11-09 (×4): 17 g via ORAL
  Filled 2024-11-04 (×5): qty 1

## 2024-11-04 MED ORDER — IPRATROPIUM-ALBUTEROL 0.5-2.5 (3) MG/3ML IN SOLN
3.0000 mL | Freq: Once | RESPIRATORY_TRACT | Status: AC
  Administered 2024-11-04: 3 mL via RESPIRATORY_TRACT
  Filled 2024-11-04: qty 3

## 2024-11-04 NOTE — Progress Notes (Signed)
 " Progress Note   Patient: Cheryl [name] FMW:[id number] DOB: 1946-03-07 DOA: 11/01/2024     2 DOS: the patient was seen and examined on 11/04/2024   Brief hospital course: H&P HPI: Cheryl [name] is a 79 y.o. female with Past medical history of HTN, HLD, depression, DJD, rheumatoid arthritis as reviewed from EMR, presented with complaining of having cough, shortness of breath and fatigue, generalized body ache.  As per patient she was positive for flu few days ago and she was given treatment for that but it is not helping and her condition was getting worse so she came into the ED.     ED Course: VS afebrile, HR 78, RR 20, BP 98/60, 98% on RA BMP sodium 122, hyponatremic, potassium 3.0, hypokalemia, chloride 95, calcium  8.8, rest within normal range CBC:WBC 19.3, Hb 11.7 CXR: Multifocal pneumonia EKG: Sinus rhythm, nonspecific STW changes QTc 504   Assessment and Plan: # Influenza A pneumonia # CAP (community acquired pneumonia)  possible bacterial superinfection Rapid influenza test was positive on 1/2, patient was prescribed Tamiflu  Continue Tamiflu  for 2 more days S/p ceftriaxone  and azithromycin  given in the ED Continue ceftriaxone  1 g IV daily for total 5 days Cont doxycycline  due to QTc prolong for 5 days  Mucinex  600 mg p.o. twice daily Tussionex as needed As needed meds for pain control Continue steroids Given worsening hypoxia over night and + d dimer will obtain CTA chest to check for PE   Leukocytosis likely worsened due to steroids Continue antibiotics.   Polyuria Possible UTI Continue antibiotics per above F/u Ucx   Normocytic anemia Iron  deficiency    Latest Ref Rng & Units 11/04/2024    6:27 AM 11/03/2024    5:48 AM 11/02/2024    4:18 AM  CBC  WBC 4.0 - 10.5 K/uL 30.1  26.8  16.2   Hemoglobin 12.0 - 15.0 g/dL 88.9  89.2  88.5   Hematocrit 36.0 - 46.0 % 31.2  31.7  34.0   Platelets 150 - 400 K/uL 272  252  252   Iron /TIBC/Ferritin/ %Sat    Component Value  Date/Time   IRON  19 (L) 11/01/2024 1528   TIBC 195 (L) 11/01/2024 1528   IRONPCTSAT 10 (L) 11/01/2024 1528   Hgb stable.  -Iron  supplement  Hypokalemia, Replace PRN      HTN, HLD Continue losartan  Resume statin on discharge    Depression and dementia Continued home meds, patient is on multiple medications, polypharmacy, recommend to follow with PCP to reduce polypharmacy as an outpatient   Tobacco use disorder  TOC consulted Enxouraged cessation      Subjective: Cough is improved this AM. Had some reported worsening hypoxia.   Physical Exam: Vitals:   11/03/24 1947 11/03/24 2031 11/04/24 0358 11/04/24 0700  BP: (!) 182/74 (!) 146/81 113/76 (!) 146/81  Pulse: 75 82 71 82  Resp: 16 17 16 17   Temp: 98 F (36.7 C) 98.3 F (36.8 C) 98.6 F (37 C) 98.3 F (36.8 C)  TempSrc: Oral Oral Oral Oral  SpO2: 94% (!) 88% 93% (!) 88%  Weight:      Height:        Physical Exam  Constitutional: In no distress.  Cardiovascular: Normal rate, regular rhythm. No lower extremity edema  Pulmonary: Non labored breathing on [place], no wheezing or rales.   Abdominal: Soft. Non distended and non tender Musculoskeletal: Normal range of motion.     Neurological: Alert and oriented to person, place, and time.  Non focal  Skin: Skin is warm and dry.     Data Reviewed:     Latest Ref Rng & Units 11/04/2024    6:27 AM 11/03/2024    5:48 AM 11/02/2024    4:18 AM  CBC  WBC 4.0 - 10.5 K/uL 30.1  26.8  16.2   Hemoglobin 12.0 - 15.0 g/dL 88.9  89.2  88.5   Hematocrit 36.0 - 46.0 % 31.2  31.7  34.0   Platelets 150 - 400 K/uL 272  252  252       Latest Ref Rng & Units 11/04/2024    6:27 AM 11/03/2024    5:48 AM 11/02/2024    4:18 AM  BMP  Glucose 70 - 99 mg/dL 75  893  68   BUN 8 - 23 mg/dL 11  12  9    Creatinine 0.44 - 1.00 mg/dL 9.53  9.43  9.40   Sodium 135 - 145 mmol/L 137  136  137   Potassium 3.5 - 5.1 mmol/L 3.6  4.6  3.8   Chloride 98 - 111 mmol/L 102  102  103   CO2 22 - 32 mmol/L 24   22  24    Calcium  8.9 - 10.3 mg/dL 9.0  9.5  9.0      Family Communication: None at bedside.   Disposition: Status is: Inpatient Remains inpatient appropriate because: O2 requirement   Planned Discharge Destination: Home    Time spent: 35 minutes  Author: Alban Pepper, MD 11/04/2024 3:26 PM  For on call review [email or web address].  "

## 2024-11-04 NOTE — Plan of Care (Signed)

## 2024-11-04 NOTE — Plan of Care (Signed)

## 2024-11-04 NOTE — TOC Progression Note (Signed)
 Transition of Care [facility]) - Progression Note    Patient Details  Name: Cheryl [name] MRN: [id number] Date of Birth: [birth year]  [facility]) CM/SW Contact  Dalia GORMAN Fuse, RN Phone Number: 11/04/2024, 9:33 AM  Clinical Narrative:    WBC 30.1 today, trending up. Plan to continue iv  abx. Requiring O2 support,  satting 88% on [place].   Plan for home with [facility] PT/OT when medically clear to discharge.  TOC will continue to follow.    Expected Discharge Plan: Home/Self Care Barriers to Discharge: Continued Medical Work up               Expected Discharge Plan and Services   Discharge Planning Services: CM Consult                                           Social Drivers of Health (SDOH) Interventions SDOH Screenings   Food Insecurity: No Food Insecurity (11/01/2024)  Housing: Low Risk (11/01/2024)  Transportation Needs: No Transportation Needs (11/01/2024)  Utilities: Not At Risk (11/01/2024)  Financial Resource Strain: Low Risk  (05/10/2024)   Received from [facility]  Social Connections: Unknown (11/01/2024)  Tobacco Use: High Risk (11/01/2024)    Readmission Risk Interventions     No data to display

## 2024-11-05 DIAGNOSIS — J188 Other pneumonia, unspecified organism: Secondary | ICD-10-CM | POA: Diagnosis not present

## 2024-11-05 LAB — BASIC METABOLIC PANEL WITH GFR
Anion gap: 11 (ref 5–15)
BUN: 11 mg/dL (ref 8–23)
CO2: 24 mmol/L (ref 22–32)
Calcium: 9.1 mg/dL (ref 8.9–10.3)
Chloride: 100 mmol/L (ref 98–111)
Creatinine, Ser: 0.44 mg/dL (ref 0.44–1.00)
GFR, Estimated: >60 mL/min
Glucose, Bld: 66 mg/dL — ABNORMAL LOW (ref 70–99)
Potassium: 3.1 mmol/L — ABNORMAL LOW (ref 3.5–5.1)
Sodium: 135 mmol/L (ref 135–145)

## 2024-11-05 LAB — CBC WITH DIFFERENTIAL/PLATELET
Abs Immature Granulocytes: 1 K/uL — ABNORMAL HIGH (ref 0.00–0.07)
Basophils Absolute: 0.1 K/uL (ref 0.0–0.1)
Basophils Relative: 0 %
Eosinophils Absolute: 0 K/uL (ref 0.0–0.5)
Eosinophils Relative: 0 %
HCT: 30.1 % — ABNORMAL LOW (ref 36.0–46.0)
Hemoglobin: 10.6 g/dL — ABNORMAL LOW (ref 12.0–15.0)
Immature Granulocytes: 5 %
Lymphocytes Relative: 11 %
Lymphs Abs: 2.3 K/uL (ref 0.7–4.0)
MCH: 29.5 pg (ref 26.0–34.0)
MCHC: 35.2 g/dL (ref 30.0–36.0)
MCV: 83.8 fL (ref 80.0–100.0)
Monocytes Absolute: 0.9 K/uL (ref 0.1–1.0)
Monocytes Relative: 4 %
Neutro Abs: 16.2 K/uL — ABNORMAL HIGH (ref 1.7–7.7)
Neutrophils Relative %: 80 %
Platelets: 267 K/uL (ref 150–400)
RBC: 3.59 MIL/uL — ABNORMAL LOW (ref 3.87–5.11)
RDW: 16.6 % — ABNORMAL HIGH (ref 11.5–15.5)
WBC: 20.4 K/uL — ABNORMAL HIGH (ref 4.0–10.5)
nRBC: 0.1 % (ref 0.0–0.2)

## 2024-11-05 LAB — URINE CULTURE: Culture: NO GROWTH

## 2024-11-05 LAB — GLUCOSE, CAPILLARY
Glucose-Capillary: 121 mg/dL — ABNORMAL HIGH (ref 70–99)
Glucose-Capillary: 104 mg/dL — ABNORMAL HIGH (ref 70–99)

## 2024-11-05 MED ORDER — BENZONATATE 100 MG PO CAPS
200.0000 mg | ORAL_CAPSULE | Freq: Three times a day (TID) | ORAL | Status: DC
  Administered 2024-11-05 – 2024-11-10 (×15): 200 mg via ORAL
  Filled 2024-11-05 (×15): qty 2

## 2024-11-05 MED ORDER — POTASSIUM CHLORIDE 20 MEQ PO PACK
40.0000 meq | PACK | Freq: Once | ORAL | Status: AC
  Administered 2024-11-05: 40 meq via ORAL
  Filled 2024-11-05: qty 2

## 2024-11-05 MED ORDER — FOLIC ACID 1 MG PO TABS
1.0000 mg | ORAL_TABLET | Freq: Every day | ORAL | Status: DC
  Administered 2024-11-05 – 2024-11-10 (×6): 1 mg via ORAL
  Filled 2024-11-05 (×6): qty 1

## 2024-11-05 MED ORDER — LORAZEPAM 0.5 MG PO TABS
0.2500 mg | ORAL_TABLET | Freq: Two times a day (BID) | ORAL | Status: DC | PRN
  Administered 2024-11-05 – 2024-11-07 (×3): 0.25 mg via ORAL
  Filled 2024-11-05 (×3): qty 1

## 2024-11-05 NOTE — Hospital Course (Addendum)
 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    Methotrexate was started 03/16/2024 for eczema to try and wean from systemic steroids.    She presented 1/2 to urgent care with nasal congestion, cough, weakness, body aches, and low grade fever. She was influenza A + at that time.   She was discharged with tamiflu .   She reported worsening sob, fatigue ,and cough since her urgent care visit and presented to the ED here 1/6. At that time her CT chest showed diffuse ground glass opacities.   I am wondering if you think the imaging findings are because of her influenza infection or if I should work that up further.

## 2024-11-05 NOTE — Plan of Care (Signed)

## 2024-11-05 NOTE — Plan of Care (Signed)
   Problem: Safety: Goal: Ability to remain free from injury will improve Outcome: Progressing   Problem: Skin Integrity: Goal: Risk for impaired skin integrity will decrease Outcome: Progressing

## 2024-11-05 NOTE — Progress Notes (Signed)
 " Progress Note   Patient: Cheryl [name] FMW:[id number] DOB: 12-22-1945 DOA: 11/01/2024     3 DOS: the patient was seen and examined on 11/05/2024   Brief hospital course: H&P HPI: MALONIE TATUM is a 79 y.o. female with Past medical history of HTN, HLD, depression, DJD, rheumatoid arthritis as reviewed from EMR, presented with complaining of having cough, shortness of breath and fatigue, generalized body ache.  As per patient she was positive for flu few days ago and she was given treatment for that but it is not helping and her condition was getting worse so she came into the ED.     ED Course: VS afebrile, HR 78, RR 20, BP 98/60, 98% on RA BMP sodium 122, hyponatremic, potassium 3.0, hypokalemia, chloride 95, calcium  8.8, rest within normal range CBC:WBC 19.3, Hb 11.7 CXR: Multifocal pneumonia EKG: Sinus rhythm, nonspecific STW changes QTc 504   Assessment and Plan: # Influenza A pneumonia # CAP (community acquired pneumonia)  possible bacterial superinfection CTA chest negative for PE, notable for diffuse ground glass opacities Rapid influenza test was positive on 1/2, patient was prescribed Tamiflu  Continue Tamiflu  for 2 more days S/p ceftriaxone  and azithromycin  given in the ED Continue ceftriaxone  1 g IV daily for total 5 days Cont doxycycline  due to QTc prolong for 5 days  Mucinex  600 mg p.o. twice daily Tussionex as needed As needed meds for pain control Continue steroids    Leukocytosis  Secondary to steroids  Polyuria Possible UTI Continue antibiotics per above Urine culture with no growth   Normocytic anemia Iron  deficiency    Latest Ref Rng & Units 11/05/2024    6:16 AM 11/04/2024    6:27 AM 11/03/2024    5:48 AM  CBC  WBC 4.0 - 10.5 K/uL 20.4  30.1  26.8   Hemoglobin 12.0 - 15.0 g/dL 89.3  88.9  89.2   Hematocrit 36.0 - 46.0 % 30.1  31.2  31.7   Platelets 150 - 400 K/uL 267  272  252   Iron /TIBC/Ferritin/ %Sat    Component Value Date/Time   IRON  19 (L)  11/01/2024 1528   TIBC 195 (L) 11/01/2024 1528   IRONPCTSAT 10 (L) 11/01/2024 1528   Hgb stable.  -Iron  supplement  Hypokalemia, Replace PRN   History of eczema On methotrexate outpatient, this was started 02/2024   HTN, HLD Continue losartan  Resume statin on discharge    Depression and dementia Continued home meds, patient is on multiple medications, polypharmacy, recommend to follow with PCP to reduce polypharmacy as an outpatient   Tobacco use disorder  TOC consulted Enxouraged cessation      Subjective: Cough is improved this AM. Had some reported worsening hypoxia.   Physical Exam: Vitals:   11/04/24 1928 11/05/24 0437 11/05/24 0736 11/05/24 1519  BP: (!) 169/95 (!) 163/83 (!) 187/79 (!) 188/77  Pulse: 74 82 80 83  Resp: 16 17 18 18   Temp: 98.5 F (36.9 C) 98.6 F (37 C) 98.2 F (36.8 C) 99.4 F (37.4 C)  TempSrc: Oral   Oral  SpO2: 93% 90% 96% 96%  Weight:      Height:         Constitutional: In no distress.  Cardiovascular: Normal rate, regular rhythm. No lower extremity edema  Pulmonary: Non labored breathing on 4L [place], no wheezing or rales.   Abdominal: Soft. Non distended and non tender Musculoskeletal: Normal range of motion.     Neurological: Alert and oriented to person, place, and  time. Non focal  Skin: Skin is warm and dry.     Data Reviewed:     Latest Ref Rng & Units 11/05/2024    6:16 AM 11/04/2024    6:27 AM 11/03/2024    5:48 AM  CBC  WBC 4.0 - 10.5 K/uL 20.4  30.1  26.8   Hemoglobin 12.0 - 15.0 g/dL 89.3  88.9  89.2   Hematocrit 36.0 - 46.0 % 30.1  31.2  31.7   Platelets 150 - 400 K/uL 267  272  252       Latest Ref Rng & Units 11/05/2024    6:16 AM 11/04/2024    6:27 AM 11/03/2024    5:48 AM  BMP  Glucose 70 - 99 mg/dL 66  75  893   BUN 8 - 23 mg/dL 11  11  12    Creatinine 0.44 - 1.00 mg/dL 9.55  9.53  9.43   Sodium 135 - 145 mmol/L 135  137  136   Potassium 3.5 - 5.1 mmol/L 3.1  3.6  4.6   Chloride 98 - 111 mmol/L 100  102   102   CO2 22 - 32 mmol/L 24  24  22    Calcium  8.9 - 10.3 mg/dL 9.1  9.0  9.5      Family Communication: None at bedside.   Disposition: Status is: Inpatient Remains inpatient appropriate because: O2 requirement   Planned Discharge Destination: Home    Time spent: 35 minutes  Author: Alban Pepper, MD 11/05/2024 3:53 PM  For on call review [email or web address].  "

## 2024-11-06 DIAGNOSIS — J188 Other pneumonia, unspecified organism: Secondary | ICD-10-CM | POA: Diagnosis not present

## 2024-11-06 LAB — BASIC METABOLIC PANEL WITH GFR
Anion gap: 12 (ref 5–15)
BUN: 13 mg/dL (ref 8–23)
CO2: 25 mmol/L (ref 22–32)
Calcium: 8.9 mg/dL (ref 8.9–10.3)
Chloride: 98 mmol/L (ref 98–111)
Creatinine, Ser: 0.5 mg/dL (ref 0.44–1.00)
GFR, Estimated: >60 mL/min
Glucose, Bld: 110 mg/dL — ABNORMAL HIGH (ref 70–99)
Potassium: 2.7 mmol/L — CL (ref 3.5–5.1)
Sodium: 134 mmol/L — ABNORMAL LOW (ref 135–145)

## 2024-11-06 LAB — CBC WITH DIFFERENTIAL/PLATELET
Abs Immature Granulocytes: 0.8 K/uL — ABNORMAL HIGH (ref 0.00–0.07)
Basophils Absolute: 0.1 K/uL (ref 0.0–0.1)
Basophils Relative: 0 %
Eosinophils Absolute: 0 K/uL (ref 0.0–0.5)
Eosinophils Relative: 0 %
HCT: 33 % — ABNORMAL LOW (ref 36.0–46.0)
Hemoglobin: 11.5 g/dL — ABNORMAL LOW (ref 12.0–15.0)
Immature Granulocytes: 4 %
Lymphocytes Relative: 10 %
Lymphs Abs: 1.8 K/uL (ref 0.7–4.0)
MCH: 30.1 pg (ref 26.0–34.0)
MCHC: 34.8 g/dL (ref 30.0–36.0)
MCV: 86.4 fL (ref 80.0–100.0)
Monocytes Absolute: 0.6 K/uL (ref 0.1–1.0)
Monocytes Relative: 3 %
Neutro Abs: 15.7 K/uL — ABNORMAL HIGH (ref 1.7–7.7)
Neutrophils Relative %: 83 %
Platelets: 271 K/uL (ref 150–400)
RBC: 3.82 MIL/uL — ABNORMAL LOW (ref 3.87–5.11)
RDW: 17.1 % — ABNORMAL HIGH (ref 11.5–15.5)
WBC: 19 K/uL — ABNORMAL HIGH (ref 4.0–10.5)
nRBC: 0.1 % (ref 0.0–0.2)

## 2024-11-06 LAB — GLUCOSE, CAPILLARY
Glucose-Capillary: 79 mg/dL (ref 70–99)
Glucose-Capillary: 105 mg/dL — ABNORMAL HIGH (ref 70–99)
Glucose-Capillary: 119 mg/dL — ABNORMAL HIGH (ref 70–99)
Glucose-Capillary: 107 mg/dL — ABNORMAL HIGH (ref 70–99)
Glucose-Capillary: 106 mg/dL — ABNORMAL HIGH (ref 70–99)

## 2024-11-06 LAB — CULTURE, BLOOD (ROUTINE X 2)
Culture: NO GROWTH
Culture: NO GROWTH

## 2024-11-06 LAB — MRSA NEXT GEN BY PCR, NASAL: MRSA by PCR Next Gen: NOT DETECTED

## 2024-11-06 MED ORDER — ENSURE PLUS HIGH PROTEIN PO LIQD
237.0000 mL | Freq: Two times a day (BID) | ORAL | Status: DC
  Administered 2024-11-07 – 2024-11-08 (×3): 237 mL via ORAL

## 2024-11-06 MED ORDER — ATOVAQUONE 750 MG/5ML PO SUSP
750.0000 mg | Freq: Two times a day (BID) | ORAL | Status: DC
  Administered 2024-11-06 – 2024-11-10 (×8): 750 mg via ORAL
  Filled 2024-11-06 (×9): qty 5

## 2024-11-06 MED ORDER — IPRATROPIUM-ALBUTEROL 0.5-2.5 (3) MG/3ML IN SOLN
3.0000 mL | Freq: Once | RESPIRATORY_TRACT | Status: AC
  Administered 2024-11-06: 3 mL via RESPIRATORY_TRACT
  Filled 2024-11-06: qty 3

## 2024-11-06 MED ORDER — POTASSIUM CHLORIDE 20 MEQ PO PACK
40.0000 meq | PACK | ORAL | Status: AC
  Administered 2024-11-06 (×2): 40 meq via ORAL
  Filled 2024-11-06 (×2): qty 2

## 2024-11-06 NOTE — Plan of Care (Signed)
  Problem: Pain Managment: Goal: General experience of comfort will improve and/or be controlled Outcome: Progressing   Problem: Safety: Goal: Ability to remain free from injury will improve Outcome: Progressing   Problem: Activity: Goal: Risk for activity intolerance will decrease Outcome: Not Progressing   Problem: Nutrition: Goal: Adequate nutrition will be maintained Outcome: Not Progressing   Problem: Skin Integrity: Goal: Risk for impaired skin integrity will decrease Outcome: Not Progressing

## 2024-11-06 NOTE — Plan of Care (Signed)

## 2024-11-06 NOTE — Progress Notes (Signed)
 " Progress Note   Patient: Cheryl [name] FMW:[id number] DOB: 1945/12/04 DOA: 11/01/2024     4 DOS: the patient was seen and examined on 11/06/2024   Brief hospital course: H&P HPI: Cheryl [name] is a 79 y.o. female with Past medical history of HTN, HLD, depression, DJD, rheumatoid arthritis as reviewed from EMR, presented with complaining of having cough, shortness of breath and fatigue, generalized body ache.  As per patient she was positive for flu few days ago and she was given treatment for that but it is not helping and her condition was getting worse so she came into the ED.     ED Course: VS afebrile, HR 78, RR 20, BP 98/60, 98% on RA BMP sodium 122, hyponatremic, potassium 3.0, hypokalemia, chloride 95, calcium  8.8, rest within normal range CBC:WBC 19.3, Hb 11.7 CXR: Multifocal pneumonia EKG: Sinus rhythm, nonspecific STW changes QTc 504   Assessment and Plan: # Influenza A pneumonia # CAP (community acquired pneumonia)  Acute resp failure, hypoxic  possible bacterial superinfection Rapid influenza test was positive on 1/2, patient was prescribed Tamiflu  Procalcitonin 0.29.  CTA chest negative for PE, notable for diffuse ground glass opacities  Status post Tamiflu  course S/p ceftriaxone  and doxycycline  Status post 5-day course of steroids  Plan: Given patient's immunosuppression on methotrexate for eczema will start empiric treatment for mild PJP with atovaquone  given she had dermatitis rash with bactrim F/u fungitell F/u sputum culture  F/u BNP  Mucinex  600 mg p.o. twice daily Tussionex as needed As needed meds for pain control Wean oxygen as able    Leukocytosis  Secondary to steroids  Polyuria Possible UTI Continue antibiotics per above Urine culture with no growth  Hypok Replace PRN    Normocytic anemia Iron  deficiency    Latest Ref Rng & Units 11/06/2024    8:23 AM 11/05/2024    6:16 AM 11/04/2024    6:27 AM  CBC  WBC 4.0 - 10.5 K/uL 19.0  20.4   30.1   Hemoglobin 12.0 - 15.0 g/dL 88.4  89.3  88.9   Hematocrit 36.0 - 46.0 % 33.0  30.1  31.2   Platelets 150 - 400 K/uL 271  267  272   Iron /TIBC/Ferritin/ %Sat    Component Value Date/Time   IRON  19 (L) 11/01/2024 1528   TIBC 195 (L) 11/01/2024 1528   IRONPCTSAT 10 (L) 11/01/2024 1528   Hgb stable.  -Iron  supplement  Hypokalemia, Replace PRN   History of eczema On methotrexate outpatient, this was started 02/2024   HTN, HLD Continue losartan  Resume statin on discharge    Depression and dementia Continued home meds, patient is on multiple medications, polypharmacy, recommend to follow with PCP to reduce polypharmacy as an outpatient   Tobacco use disorder  TOC consulted Enxouraged cessation      Subjective: Cough is improved.  Physical Exam: Vitals:   11/05/24 2017 11/06/24 0407 11/06/24 0807 11/06/24 1357  BP: (!) 188/82 (!) 160/91 (!) 177/68   Pulse: 79 88 83   Resp: 16 16 18    Temp: 98.9 F (37.2 C) 98.6 F (37 C) 98.6 F (37 C)   TempSrc:   Oral   SpO2: 91% 95% 98% 96%  Weight:      Height:         Constitutional: In no distress.  Cardiovascular: Normal rate, regular rhythm. No lower extremity edema  Pulmonary: Non labored breathing on [place], wheezing diffusely   Abdominal: Soft. Non distended and non tender Musculoskeletal: Normal  range of motion.     Neurological: Alert and oriented to person, place, and time. Non focal  Skin: Skin is warm and dry.     Data Reviewed:     Latest Ref Rng & Units 11/06/2024    8:23 AM 11/05/2024    6:16 AM 11/04/2024    6:27 AM  CBC  WBC 4.0 - 10.5 K/uL 19.0  20.4  30.1   Hemoglobin 12.0 - 15.0 g/dL 88.4  89.3  88.9   Hematocrit 36.0 - 46.0 % 33.0  30.1  31.2   Platelets 150 - 400 K/uL 271  267  272       Latest Ref Rng & Units 11/06/2024    8:23 AM 11/05/2024    6:16 AM 11/04/2024    6:27 AM  BMP  Glucose 70 - 99 mg/dL 889  66  75   BUN 8 - 23 mg/dL 13  11  11    Creatinine 0.44 - 1.00 mg/dL 9.49  9.55   9.53   Sodium 135 - 145 mmol/L 134  135  137   Potassium 3.5 - 5.1 mmol/L 2.7  3.1  3.6   Chloride 98 - 111 mmol/L 98  100  102   CO2 22 - 32 mmol/L 25  24  24    Calcium  8.9 - 10.3 mg/dL 8.9  9.1  9.0      Family Communication: None at bedside.   Disposition: Status is: Inpatient Remains inpatient appropriate because: O2 requirement   Planned Discharge Destination: Home    Time spent: 35 minutes  Author: Alban Pepper, MD 11/06/2024 4:04 PM  For on call review [email or web address]  "

## 2024-11-06 NOTE — Plan of Care (Signed)
  Problem: Clinical Measurements: Goal: Ability to maintain clinical measurements within normal limits will improve Outcome: Progressing   Problem: Pain Managment: Goal: General experience of comfort will improve and/or be controlled Outcome: Progressing   Problem: Safety: Goal: Ability to remain free from injury will improve Outcome: Progressing   Problem: Skin Integrity: Goal: Risk for impaired skin integrity will decrease Outcome: Progressing

## 2024-11-07 ENCOUNTER — Inpatient Hospital Stay

## 2024-11-07 DIAGNOSIS — J188 Other pneumonia, unspecified organism: Secondary | ICD-10-CM | POA: Diagnosis not present

## 2024-11-07 LAB — CBC WITH DIFFERENTIAL/PLATELET
Abs Immature Granulocytes: 0.73 K/uL — ABNORMAL HIGH (ref 0.00–0.07)
Basophils Absolute: 0.1 K/uL (ref 0.0–0.1)
Basophils Relative: 0 %
Eosinophils Absolute: 0.2 K/uL (ref 0.0–0.5)
Eosinophils Relative: 1 %
HCT: 34.5 % — ABNORMAL LOW (ref 36.0–46.0)
Hemoglobin: 12.2 g/dL (ref 12.0–15.0)
Immature Granulocytes: 5 %
Lymphocytes Relative: 15 %
Lymphs Abs: 2.4 K/uL (ref 0.7–4.0)
MCH: 30 pg (ref 26.0–34.0)
MCHC: 35.4 g/dL (ref 30.0–36.0)
MCV: 85 fL (ref 80.0–100.0)
Monocytes Absolute: 0.5 K/uL (ref 0.1–1.0)
Monocytes Relative: 3 %
Neutro Abs: 12 K/uL — ABNORMAL HIGH (ref 1.7–7.7)
Neutrophils Relative %: 76 %
Platelets: 287 K/uL (ref 150–400)
RBC: 4.06 MIL/uL (ref 3.87–5.11)
RDW: 17.2 % — ABNORMAL HIGH (ref 11.5–15.5)
WBC: 15.9 K/uL — ABNORMAL HIGH (ref 4.0–10.5)
nRBC: 0 % (ref 0.0–0.2)

## 2024-11-07 LAB — MYCOPLASMA PNEUMONIAE ANTIBODY, IGM: Mycoplasma pneumo IgM: <770 U/mL (ref 0–769)

## 2024-11-07 LAB — PROCALCITONIN: Procalcitonin: 0.15 ng/mL

## 2024-11-07 LAB — EXPECTORATED SPUTUM ASSESSMENT W GRAM STAIN, RFLX TO RESP C

## 2024-11-07 LAB — PRO BRAIN NATRIURETIC PEPTIDE: Pro Brain Natriuretic Peptide: 1012 pg/mL — ABNORMAL HIGH

## 2024-11-07 LAB — MAGNESIUM: Magnesium: 2.2 mg/dL (ref 1.7–2.4)

## 2024-11-07 LAB — BASIC METABOLIC PANEL WITH GFR
Anion gap: 11 (ref 5–15)
BUN: 15 mg/dL (ref 8–23)
CO2: 21 mmol/L — ABNORMAL LOW (ref 22–32)
Calcium: 9.1 mg/dL (ref 8.9–10.3)
Chloride: 99 mmol/L (ref 98–111)
Creatinine, Ser: 0.5 mg/dL (ref 0.44–1.00)
GFR, Estimated: >60 mL/min
Glucose, Bld: 85 mg/dL (ref 70–99)
Potassium: 3.7 mmol/L (ref 3.5–5.1)
Sodium: 131 mmol/L — ABNORMAL LOW (ref 135–145)

## 2024-11-07 LAB — GLUCOSE, CAPILLARY
Glucose-Capillary: 101 mg/dL — ABNORMAL HIGH (ref 70–99)
Glucose-Capillary: 81 mg/dL (ref 70–99)

## 2024-11-07 MED ORDER — HYDRALAZINE HCL 50 MG PO TABS: 50.0000 mg | ORAL_TABLET | Freq: Three times a day (TID) | ORAL | Status: DC | PRN

## 2024-11-07 MED ORDER — HYDRALAZINE HCL 20 MG/ML IJ SOLN
10.0000 mg | Freq: Four times a day (QID) | INTRAMUSCULAR | Status: DC | PRN
  Administered 2024-11-07: 10 mg via INTRAVENOUS
  Filled 2024-11-07: qty 1

## 2024-11-07 MED ORDER — FUROSEMIDE 10 MG/ML IJ SOLN: 40.0000 mg | Freq: Once | INTRAMUSCULAR | Status: DC

## 2024-11-07 NOTE — Progress Notes (Unsigned)
 "    11/08/2024 9:53 AM   Cheryl [name] Aug 25, 1946 [id number]  Referring provider: Tobie Domino, MD [address] [place],  [place] [address]  Urological history: 1.  Urinary retention - Secondary to the setting of antimuscarinics, acute cystitis, recent functional decline and cognitive decline - Passed voiding trial on August 04, 2024   No chief complaint on file.  HPI: Cheryl [name] is a 79 y.o. woman who presents today for pelvic exam after starting vaginal estrogen with her daughter, Mont.  Previous records reviewed.  In hospital   PVR ***  PMH: Past Medical History:  Diagnosis Date   Abdominal aneurysm    Allergy    seasonal   Anxiety    Arthritis    hips   Cataract    Complication of anesthesia    facial swelling after hysterectomy   Depression    Gastric ulcer 10/27/2010   Hypertension    Hypokalemia    Hyponatremia    Lymphocytic colitis    Osteopenia    Peptic ulcer    Piriformis syndrome    Pneumonia    Pre-diabetes    Radiculopathy of lumbar region    Ulcerative colitis (HCC)    Vertigo    with sinus issues   Wears dentures    partial upper    Surgical History: Past Surgical History:  Procedure Laterality Date   ABDOMINAL HYSTERECTOMY     APPENDECTOMY     BACK SURGERY     BREAST BIOPSY Left 2012   negative   CATARACT EXTRACTION, BILATERAL Bilateral 2005   COLONOSCOPY  2012   COLONOSCOPY WITH PROPOFOL  N/A 10/11/2015   Procedure: COLONOSCOPY WITH PROPOFOL ;  Surgeon: Rogelia Copping, MD;  Location: [facility];  Service: Endoscopy;  Laterality: N/A;   COLONOSCOPY WITH PROPOFOL  N/A 05/24/2021   Procedure: COLONOSCOPY WITH PROPOFOL ;  Surgeon: Dessa Reyes ORN, MD;  Location: [facility] END[facility];  Service: Gastroenterology;  Laterality: N/A;   ESOPHAGOGASTRODUODENOSCOPY N/A 05/24/2021   Procedure: ESOPHAGOGASTRODUODENOSCOPY (EGD);  Surgeon: Dessa Reyes ORN, MD;  Location: [facility];  Service: Gastroenterology;   Laterality: N/A;   POLYPECTOMY  10/11/2015   Procedure: POLYPECTOMY;  Surgeon: Rogelia Copping, MD;  Location: [facility];  Service: Endoscopy;;   XI ROBOT ASSISTED RECTOPEXY N/A 10/09/2023   Procedure: XI ROBOT ASSISTED SUTURE RECTOPEXY;  Surgeon: Teresa Lonni HERO, MD;  Location: [facility];  Service: General;  Laterality: N/A;    Home Medications:  Allergies as of 11/08/2024       Reactions   Ceftriaxone  Dermatitis   Cephalexin  Dermatitis   Sulfamethoxazole-trimethoprim Dermatitis   Other Reaction(s): felt shaky while taking   Gramineae Pollens Other (See Comments)   Other Reaction(s): Other (See Comments)   Doxycycline  Nausea Only, Palpitations   Shaky   Hydroxychloroquine Dermatitis   Sulfa Antibiotics Nausea Only   Shaking         Medication List      Notice   This visit is during an admission. Changes to the med list made in this visit will be reflected in the After Visit Summary of the admission.     Allergies:  Allergies  Allergen Reactions   Ceftriaxone  Dermatitis   Cephalexin  Dermatitis   Sulfamethoxazole-Trimethoprim Dermatitis    Other Reaction(s): felt shaky while taking   Gramineae Pollens Other (See Comments)    Other Reaction(s): Other (See Comments)   Doxycycline  Nausea Only and Palpitations    Shaky   Hydroxychloroquine Dermatitis   Sulfa Antibiotics Nausea Only  Shaking     Family History: Family History  Problem Relation Age of Onset   Arthritis Mother    Diabetes Mother    Depression Mother    COPD Mother    Heart disease Mother    Hypertension Mother    Alcohol abuse Father    Stroke Father    Stroke Paternal Aunt    Breast cancer Maternal Grandmother     Social History:  reports that she has been smoking cigarettes. She has never used smokeless tobacco. She reports that she does not currently use alcohol after a past usage of about 3.0 standard drinks of alcohol per week. She reports that she does not use  drugs.  ROS: Pertinent ROS in HPI  Physical Exam: There were no vitals taken for this visit.  Constitutional:  Well nourished. Alert and oriented, No acute distress. HEENT: [place] AT, moist mucus membranes.  Trachea midline, no masses. Cardiovascular: No clubbing, cyanosis, or edema. Respiratory: Normal respiratory effort, no increased work of breathing. GU: No CVA tenderness.  No bladder fullness or masses.  Recession of labia minora, dry, pale vulvar vaginal mucosa and loss of mucosal ridges and folds.  Normal urethral meatus, no lesions, no prolapse, no discharge.   No urethral masses, tenderness and/or tenderness. No bladder fullness, tenderness or masses. *** vagina mucosa, *** estrogen effect, no discharge, no lesions, *** pelvic support, *** cystocele and *** rectocele noted.  No cervical motion tenderness.  Uterus is freely mobile and non-fixed.  No adnexal/parametria masses or tenderness noted.  Anus and perineum are without rashes or lesions.   ***  Neurologic: Grossly intact, no focal deficits, moving all 4 extremities. Psychiatric: Normal mood and affect.    Laboratory Data: See Epic and HPI I have reviewed the labs.   Pertinent Imaging: ***   Assessment & Plan:    1. Urinary retention - ***  2. rUTI's -  - criteria for recurrent UTI has been met with 2 or more infections in 6 months or 3 or greater infections in one year ***            - vaginal estrogen cream is first-line therapy for postmenopausal women ***  - patient is instructed to increase their water  intake until the urine is pale yellow or clear (10 to 12 cups daily) ***  - taking probiotics that include  Lactobacillus crispatus in premenopausal women and oral capsules with Lactobacillus rhamnosus GR-1 and Lactobacillus reuteri RC-14 in postmenopausal women ***  - Hiprex 1 gram twice daily if creatinine clearance is > 30 ***  - could consider D-mannose or cranberry products, but evidence is weak with contradictory  findings ***  - address constipation issues ***  Estrace  /Premarin cream topically- peasized apply topically three times a week * Cetaphil to clean the genital area ( not soap)  * Cranberry supplement (-Knudsen cranberry concentrate- 1 ounce mixed with 8 ounces of water  *wash with water  after bowel movt *Probiotic for vaginal health( can try Pearls vaginal health) * Increase water  consumption- 8 glasses a day *Avoid antibiotics unless systemic infection/ or before cystoscopy * Kegel Exercise to strengthen pelvic floor  3. GSM - continue vaginal estrogen cream three nights weekly                                              No follow-ups on file.  These  notes generated with voice recognition software. I apologize for typographical errors.  Cheryl [name]  [facility] [address]  [address] [place], [place] [address] [phone]  "

## 2024-11-07 NOTE — Progress Notes (Signed)
 Physical Therapy Treatment Patient Details Name: Cheryl [name] MRN: [id number] DOB: 1946/02/06 Today's Date: 11/07/2024   History of Present Illness Pt is a 79 yo female that presented to the ED for fatigue, SOB, generalized body ache, positive for flu a few days ago but felt she was worsening. PMH of HTN, HLD, depression, DJD, RA.    PT Comments  Pt willing to participate in PT but was limited 2/2 loose stool.  Attempted to decrease supplemental O2 from 5 L  during functional activity but unable to maintain >/= 90%.  Pt performed Bed mobility and sit<>stand transfers at supervision level but unable to continue with transfer to recliner or with gait 2/2 loose stools and pt's request to lie back down.  Pt continues to experience limitations to mobility, demonstrating decreased activity tolerance and general weakness.  Pt will benefit from continued PT services upon discharge to safely address deficits listed in patient problem list for decreased caregiver assistance and eventual return to PLOF.     If plan is discharge home, recommend the following: A little help with walking and/or transfers;A little help with bathing/dressing/bathroom;Assistance with cooking/housework;Assist for transportation;Help with stairs or ramp for entrance   Can travel by private vehicle        Equipment Recommendations  None recommended by PT    Recommendations for Other Services       Precautions / Restrictions Precautions Precautions: Fall Recall of Precautions/Restrictions: Impaired Restrictions Weight Bearing Restrictions Per Provider Order: No     Mobility  Bed Mobility Overal bed mobility: Needs Assistance Bed Mobility: Supine to Sit, Sit to Supine     Supine to sit: Supervision, HOB elevated, Used rails Sit to supine: Supervision, Used rails   General bed mobility comments: labored but able to coordinate movement with effort.    Transfers Overall transfer level: Needs assistance Equipment  used: Rolling walker (2 wheels) Transfers: Sit to/from Stand Sit to Stand: Supervision           General transfer comment: cues for hand placement.  Attempted bed to chair transfer but pt had another episode of loose stool and chose to lie back down on bed.    Ambulation/Gait               General Gait Details: Unable, pt had another episode of loose stool and chose to lie back down on bed.   Stairs             Wheelchair Mobility     Tilt Bed    Modified Rankin (Stroke Patients Only)       Balance Overall balance assessment: Needs assistance Sitting-balance support: Feet supported Sitting balance-Leahy Scale: Good     Standing balance support: Single extremity supported, No upper extremity supported, Bilateral upper extremity supported, During functional activity Standing balance-Leahy Scale: Fair Standing balance comment: Fatigues quickly. During hygiene after BM, able to stand for about 1 min before needing to sit and rest, 5 LO via [place] donned.                            Communication Communication Communication: No apparent difficulties  Cognition Arousal: Alert Behavior During Therapy: WFL for tasks assessed/performed   PT - Cognitive impairments: No apparent impairments                         Following commands: Intact      Cueing Cueing Techniques: Verbal  cues  Exercises      General Comments General comments (skin integrity, edema, etc.): Attempted to decrease supplemental O2 from 5 L but unable to maintain >/= 90%.      Pertinent Vitals/Pain Pain Assessment Pain Assessment: No/denies pain    Home Living                          Prior Function            PT Goals (current goals can now be found in the care plan section) Acute Rehab PT Goals Patient Stated Goal: to feel better PT Goal Formulation: With patient Time For Goal Achievement: 11/17/24 Potential to Achieve Goals: Good Progress  towards PT goals: Progressing toward goals    Frequency    Min 2X/week      PT Plan      Co-evaluation              AM-PAC PT 6 Clicks Mobility   Outcome Measure  Help needed turning from your back to your side while in a flat bed without using bedrails?: A Little Help needed moving from lying on your back to sitting on the side of a flat bed without using bedrails?: A Little Help needed moving to and from a bed to a chair (including a wheelchair)?: A Little Help needed standing up from a chair using your arms (e.g., wheelchair or bedside chair)?: A Little Help needed to walk in hospital room?: A Little Help needed climbing 3-5 steps with a railing? : A Little 6 Click Score: 18    End of Session Equipment Utilized During Treatment: Oxygen Activity Tolerance: Patient limited by fatigue;Other (comment) (Loose stool hindering participation.) Patient left: in bed;with call bell/phone within reach;with family/visitor present Nurse Communication: Mobility status PT Visit Diagnosis: Other abnormalities of gait and mobility (R26.89);Difficulty in walking, not elsewhere classified (R26.2);Muscle weakness (generalized) (M62.81)     Time: 9047-8984 PT Time Calculation (min) (ACUTE ONLY): 23 min  Charges:    $Therapeutic Activity: 23-37 mins                       Harland Irving, PTA  11/07/2024, 10:31 AM

## 2024-11-07 NOTE — Plan of Care (Signed)

## 2024-11-07 NOTE — Progress Notes (Signed)
 OT Cancellation Note  Patient Details Name: Cheryl [name] MRN: [id number] DOB: December 02, 1945   Cancelled Treatment:    Reason Eval/Treat Not Completed: Other (comment). Pt with nursing for care. Will re-attempt OT tx at later time as pt is available.   Sharni Negron R., MPH, MS, OTR/L ascom [phone] 11/07/2024, 3:37 PM

## 2024-11-07 NOTE — Plan of Care (Signed)
" °  Problem: Clinical Measurements: Goal: Ability to maintain clinical measurements within normal limits will improve Outcome: Progressing   Problem: Pain Managment: Goal: General experience of comfort will improve and/or be controlled Outcome: Progressing   Problem: Safety: Goal: Ability to remain free from injury will improve Outcome: Progressing   Problem: Skin Integrity: Goal: Risk for impaired skin integrity will decrease Outcome: Progressing   Problem: Clinical Measurements: Goal: Respiratory complications will improve Outcome: Progressing   "

## 2024-11-07 NOTE — TOC Progression Note (Addendum)
 Transition of Care [facility]) - Progression Note    Patient Details  Name: Cheryl [name] MRN: [id number] Date of Birth: 1946/07/12  [facility]) CM/SW Contact  Dalia GORMAN Fuse, RN Phone Number: 11/07/2024, 9:58 AM  Clinical Narrative:     IV abx, steroids, and tamiflu  completed, being treated empirically for mild PJP with atovaquone . On O2 @ 5 LNC.  Plan to dc to home with [facility] PT/OT and potentially home O2 when medically appropriate.  Patient's bedside nurse reached out to advise the patient gives [facility] permission to speak with her sister Nichole about discharge planning.  TOC spoke with Nichole who drove up from [employer] to see her sister. The patient lives with 2 of her daughters who have cats and dogs in the home. The patient doesn't have a room in their homes and sleeps in the living room. The patient has a need O2 need and will likely discharge with home O2. The daughters smoke and Nichole is concerned they will not stop. TOC explained that they will need to smoke outside or away from the O2 for safety reasons.   TOC explained that current therapy recs are for [facility] PT/OT. The patient likely doesn't qualify for SNF at the moment, but that could change. TOC also shared that even if therapy recs are for SNF, insurance would still have to [facility] SNF. Nichole expressed concerns that the patient is unable to leave due to incontinence. TOC explained that incontinence can be managed on an outpatient.   Nichole advised TOC to speak with the patient's other daughter Jeremy who is the POA.   TOC spoke with Suzie. Her preference is for the patient to go to [facility], she advised she was informed by [facility] that her mom only needed a 3 night stay. TOC explained the 3 night stay is only 1 criteria, the patient also has to have a skillable need.   Suzie shared that her preference is for her mom to come to her house, but she wont because she has issues with her husband. She advised that she likes living with her sisters because  they are not married. TOC explained that we could set the patient up with [facility] including [facility] and they can assess the home environment and advise if they feel the patient needs a higher loc Suzie is in agreement with the plan.   TOC will continue to follow.  Expected Discharge Plan: Home/Self Care Barriers to Discharge: Continued Medical Work up               Expected Discharge Plan and Services   Discharge Planning Services: CM Consult                                           Social Drivers of Health (SDOH) Interventions SDOH Screenings   Food Insecurity: No Food Insecurity (11/01/2024)  Housing: Low Risk (11/01/2024)  Transportation Needs: No Transportation Needs (11/01/2024)  Utilities: Not At Risk (11/01/2024)  Financial Resource Strain: Low Risk  (05/10/2024)   Received from [facility]  Social Connections: Unknown (11/01/2024)  Tobacco Use: High Risk (11/01/2024)    Readmission Risk Interventions     No data to display

## 2024-11-07 NOTE — Progress Notes (Signed)
 " Progress Note   Patient: Cheryl [name] FMW:[id number] DOB: 1946-10-11 DOA: 11/01/2024     5 DOS: the patient was seen and examined on 11/07/2024   Brief hospital course: H&P HPI: Cheryl [name] is a 79 y.o. female with Past medical history of HTN, HLD, depression, DJD, rheumatoid arthritis as reviewed from EMR, presented with complaining of having cough, shortness of breath and fatigue, generalized body ache.  As per patient she was positive for flu few days ago and she was given treatment for that but it is not helping and her condition was getting worse so she came into the ED.     ED Course: VS afebrile, HR 78, RR 20, BP 98/60, 98% on RA BMP sodium 122, hyponatremic, potassium 3.0, hypokalemia, chloride 95, calcium  8.8, rest within normal range CBC:WBC 19.3, Hb 11.7 CXR: Multifocal pneumonia EKG: Sinus rhythm, nonspecific STW changes QTc 504   Assessment and Plan: # Influenza A pneumonia # CAP (community acquired pneumonia)  Acute resp failure, hypoxic  possible bacterial superinfection Rapid influenza test was positive on 1/2, patient was prescribed Tamiflu  Procalcitonin 0.29.  CTA chest negative for PE, notable for diffuse ground glass opacities  Status post Tamiflu  course S/p ceftriaxone  and doxycycline  Status post 5-day course of steroids O2 req now 5L  PrBNP elevated consider lasix  in the AM   Plan: Given patient's immunosuppression on methotrexate for eczema started empiric treatment for mild PJP with atovaquone  given she had dermatitis rash with bactrim, she is on d2 of this  F/u fungitell F/u sputum culture  Flutter valve  OOB to chair F/u repeat CXR  Mucinex  600 mg p.o. twice daily Tussionex as needed As needed meds for pain control Wean oxygen as able    Leukocytosis  Secondary to steroids  Polyuria Possible UTI S/p ceftriaxone   Urine culture with no growth  Hypok Replace PRN    Normocytic anemia Iron  deficiency    Latest Ref Rng & Units  11/07/2024   10:23 AM 11/06/2024    8:23 AM 11/05/2024    6:16 AM  CBC  WBC 4.0 - 10.5 K/uL 15.9  19.0  20.4   Hemoglobin 12.0 - 15.0 g/dL 87.7  88.4  89.3   Hematocrit 36.0 - 46.0 % 34.5  33.0  30.1   Platelets 150 - 400 K/uL 287  271  267   Iron /TIBC/Ferritin/ %Sat    Component Value Date/Time   IRON  19 (L) 11/01/2024 1528   TIBC 195 (L) 11/01/2024 1528   IRONPCTSAT 10 (L) 11/01/2024 1528   Hgb stable.  -Iron  supplement  Hypokalemia, Replace PRN  History of eczema On methotrexate outpatient, this was started 02/2024   HTN, HLD Continue losartan  Resume statin on discharge    Depression and dementia Continued home meds, patient is on multiple medications, polypharmacy, recommend to follow with PCP to reduce polypharmacy as an outpatient   Tobacco use disorder  TOC consulted Enxouraged cessation      Subjective: O2 requirement up to 5L. Does not feel SOB or cough worsened.   Physical Exam: Vitals:   11/07/24 0408 11/07/24 0509 11/07/24 0924 11/07/24 1537  BP: (!) 191/79 (!) 148/58 (!) 163/84 (!) 149/61  Pulse: 66 64 65 66  Resp: 20  18 18   Temp: 98 F (36.7 C)  (!) 97.5 F (36.4 C) 97.9 F (36.6 C)  TempSrc: Oral   Oral  SpO2: 94% 95% 98% 95%  Weight:      Height:  Constitutional: In no distress.  Cardiovascular: Normal rate, regular rhythm. No lower extremity edema  Pulmonary: Non labored breathing on [place], no wheezing or rales.   Abdominal: Soft. Non distended and non tender Musculoskeletal: Normal range of motion.     Neurological: Alert and oriented to person, place, and time. Non focal  Skin: Skin is warm and dry.    Data Reviewed:     Latest Ref Rng & Units 11/07/2024   10:23 AM 11/06/2024    8:23 AM 11/05/2024    6:16 AM  CBC  WBC 4.0 - 10.5 K/uL 15.9  19.0  20.4   Hemoglobin 12.0 - 15.0 g/dL 87.7  88.4  89.3   Hematocrit 36.0 - 46.0 % 34.5  33.0  30.1   Platelets 150 - 400 K/uL 287  271  267       Latest Ref Rng & Units 11/07/2024    10:23 AM 11/06/2024    8:23 AM 11/05/2024    6:16 AM  BMP  Glucose 70 - 99 mg/dL 85  889  66   BUN 8 - 23 mg/dL 15  13  11    Creatinine 0.44 - 1.00 mg/dL 9.49  9.49  9.55   Sodium 135 - 145 mmol/L 131  134  135   Potassium 3.5 - 5.1 mmol/L 3.7  2.7  3.1   Chloride 98 - 111 mmol/L 99  98  100   CO2 22 - 32 mmol/L 21  25  24    Calcium  8.9 - 10.3 mg/dL 9.1  8.9  9.1      Family Communication: None at bedside.   Disposition: Status is: Inpatient Remains inpatient appropriate because: O2 requirement   Planned Discharge Destination: Home    Time spent: 35 minutes  Author: Alban Pepper, MD 11/07/2024 4:58 PM  For on call review [email or web address].  "

## 2024-11-08 ENCOUNTER — Other Ambulatory Visit (HOSPITAL_COMMUNITY): Payer: Self-pay

## 2024-11-08 ENCOUNTER — Ambulatory Visit: Admitting: Urology

## 2024-11-08 ENCOUNTER — Telehealth (HOSPITAL_COMMUNITY): Payer: Self-pay | Admitting: Pharmacy Technician

## 2024-11-08 ENCOUNTER — Encounter: Payer: Self-pay | Admitting: Urology

## 2024-11-08 DIAGNOSIS — E440 Moderate protein-calorie malnutrition: Secondary | ICD-10-CM | POA: Insufficient documentation

## 2024-11-08 DIAGNOSIS — N958 Other specified menopausal and perimenopausal disorders: Secondary | ICD-10-CM

## 2024-11-08 DIAGNOSIS — R339 Retention of urine, unspecified: Secondary | ICD-10-CM

## 2024-11-08 DIAGNOSIS — J188 Other pneumonia, unspecified organism: Secondary | ICD-10-CM | POA: Diagnosis not present

## 2024-11-08 DIAGNOSIS — N390 Urinary tract infection, site not specified: Secondary | ICD-10-CM

## 2024-11-08 LAB — BASIC METABOLIC PANEL WITH GFR
Anion gap: 8 (ref 5–15)
BUN: 19 mg/dL (ref 8–23)
CO2: 22 mmol/L (ref 22–32)
Calcium: 8.6 mg/dL — ABNORMAL LOW (ref 8.9–10.3)
Chloride: 97 mmol/L — ABNORMAL LOW (ref 98–111)
Creatinine, Ser: 0.46 mg/dL (ref 0.44–1.00)
GFR, Estimated: >60 mL/min
Glucose, Bld: 71 mg/dL (ref 70–99)
Potassium: 4.1 mmol/L (ref 3.5–5.1)
Sodium: 127 mmol/L — ABNORMAL LOW (ref 135–145)

## 2024-11-08 LAB — CBC WITH DIFFERENTIAL/PLATELET
Abs Immature Granulocytes: 0.88 K/uL — ABNORMAL HIGH (ref 0.00–0.07)
Basophils Absolute: 0.1 K/uL (ref 0.0–0.1)
Basophils Relative: 1 %
Eosinophils Absolute: 0.2 K/uL (ref 0.0–0.5)
Eosinophils Relative: 2 %
HCT: 31.6 % — ABNORMAL LOW (ref 36.0–46.0)
Hemoglobin: 11.3 g/dL — ABNORMAL LOW (ref 12.0–15.0)
Immature Granulocytes: 8 %
Lymphocytes Relative: 18 %
Lymphs Abs: 2.1 K/uL (ref 0.7–4.0)
MCH: 30.1 pg (ref 26.0–34.0)
MCHC: 35.8 g/dL (ref 30.0–36.0)
MCV: 84.3 fL (ref 80.0–100.0)
Monocytes Absolute: 0.5 K/uL (ref 0.1–1.0)
Monocytes Relative: 4 %
Neutro Abs: 8 K/uL — ABNORMAL HIGH (ref 1.7–7.7)
Neutrophils Relative %: 67 %
Platelets: 259 K/uL (ref 150–400)
RBC: 3.75 MIL/uL — ABNORMAL LOW (ref 3.87–5.11)
RDW: 16.7 % — ABNORMAL HIGH (ref 11.5–15.5)
Smear Review: NORMAL
WBC: 11.7 K/uL — ABNORMAL HIGH (ref 4.0–10.5)
nRBC: 0 % (ref 0.0–0.2)

## 2024-11-08 LAB — GLUCOSE, CAPILLARY
Glucose-Capillary: 88 mg/dL (ref 70–99)
Glucose-Capillary: 100 mg/dL — ABNORMAL HIGH (ref 70–99)
Glucose-Capillary: 103 mg/dL — ABNORMAL HIGH (ref 70–99)
Glucose-Capillary: 82 mg/dL (ref 70–99)

## 2024-11-08 MED ORDER — ENSURE PLUS HIGH PROTEIN PO LIQD
237.0000 mL | Freq: Three times a day (TID) | ORAL | Status: DC
  Administered 2024-11-08 – 2024-11-10 (×6): 237 mL via ORAL

## 2024-11-08 MED ORDER — BUDESON-GLYCOPYRROL-FORMOTEROL 160-9-4.8 MCG/ACT IN AERO
2.0000 | INHALATION_SPRAY | Freq: Two times a day (BID) | RESPIRATORY_TRACT | Status: DC
  Administered 2024-11-08 – 2024-11-10 (×5): 2 via RESPIRATORY_TRACT
  Filled 2024-11-08: qty 5.9

## 2024-11-08 MED ORDER — FLUTICASONE FUROATE-VILANTEROL 100-25 MCG/ACT IN AEPB: 1.0000 | INHALATION_SPRAY | Freq: Every day | RESPIRATORY_TRACT | Status: DC

## 2024-11-08 MED ORDER — ADULT MULTIVITAMIN W/MINERALS CH
1.0000 | ORAL_TABLET | Freq: Every day | ORAL | Status: DC
  Administered 2024-11-09 – 2024-11-10 (×2): 1 via ORAL
  Filled 2024-11-08 (×2): qty 1

## 2024-11-08 MED ORDER — THIAMINE MONONITRATE 100 MG PO TABS
100.0000 mg | ORAL_TABLET | Freq: Every day | ORAL | Status: DC
  Administered 2024-11-09 – 2024-11-10 (×2): 100 mg via ORAL
  Filled 2024-11-08 (×2): qty 1

## 2024-11-08 MED ORDER — FUROSEMIDE 10 MG/ML IJ SOLN
40.0000 mg | Freq: Once | INTRAMUSCULAR | Status: AC
  Administered 2024-11-08: 40 mg via INTRAVENOUS
  Filled 2024-11-08: qty 4

## 2024-11-08 NOTE — Progress Notes (Signed)
 Initial Nutrition Assessment  DOCUMENTATION CODES:   Non-severe (moderate) malnutrition in context of social or environmental circumstances  INTERVENTION:   -Liberalize diet to regular for widest variety of meal selections -MVI with minerals daily -Ensure Plus High Protein po TID, each supplement provides 350 kcal and 20 grams of protein  -Magic cup TID with meals, each supplement provides 290 kcal and 9 grams of protein  -Feeding assistance with meals  -100 mg thiamine  daily x 7 days -Monitor Mg, K, and Phos and replete as needed secondary to high refeeding risk  -RD provided Nutrition Post Hospital Stay and discussed supplements that family could purchase at discharge; RD also provided Ensure coupons to family   NUTRITION DIAGNOSIS:   Moderate Malnutrition related to social / environmental circumstances as evidenced by energy intake < or equal to 75% for > or equal to 1 month, mild fat depletion, mild muscle depletion, moderate muscle depletion.  GOAL:   Patient will meet greater than or equal to 90% of their needs  MONITOR:   PO intake, Supplement acceptance  REASON FOR ASSESSMENT:   Consult Assessment of nutrition requirement/status  ASSESSMENT:   Cheryl [name] is a 79 y.o. female with Past medical history of HTN, HLD, depression, DJD, rheumatoid arthritis as reviewed from EMR, presented with complaining of having cough, shortness of breath and fatigue, generalized body ache.  As per patient she was positive for flu few days ago and she was given treatment for that but it is not helping and her condition was getting worse so she came into the ED.  Patient admitted with influenza A pneumonia and CAP.   Reviewed I/O's: +443 ml x 24 hours and +6.4 L since admission  Case discussed with OT prior to visit, who reports family concern about patient not eating. Family feeds patient due to visual impairment, but is not eating much. Patient refused to transfer to chair or sit  up on the side of the bed during session.   Spoke with patient and family at bedside. Patient very withdrawn and kept her eyes closed during conversation. History obtained from family members. Prior to admission, patient with good appetite, usually consuming 2 meals per day. Since admission, patient has been eating very little and reports she continually complains that she is tired and has minimal participation in care.   Patient has no dentures and family has been ordering softer texture foods for patient. They are been trying to order meals that she likes, such as grilled cheese and tomato soup. Patient has been drinking some Ensure- she completed an entire supplement during visit with encouragement. Per family, she will eat sweets with encouragement. Patient has mainly been drinking juice and water . She has stopped drinking coffee to help minimize diarrhea.   Noted documented meal completions 20-100%. Patient consumed a few grapes off meal tray.   Patient unsure if she has lost weight. Reviewed weight history. Weight has ranged from 46.7-54 kg over the past 10 months.   Discussed importance of good meal and supplement intake to promote healing. Also discussed importance of nutrition to help with therapy goals. Patient states I have people at home that will help me.   RD discussed downgrading to a mechanically altered diet versus upgrading to a regular diet for widest variety of meal selections. Family would like to upgrade to regular diet for widest variety of meal selections. Reviewed supplements on the formulary; amenable to [employer] and Ensure. RD also provided coupons to family for home use.  Per TOC notes, plan d/c to home with [facility] PT/OT and potentially home O2 when medically appropriate.   Medications reviewed and include lovenox , ferrous sulfate , folic acid , and protonix .   Labs reviewed: Na: 127, CBGS: 81-119 (inpatient orders for glycemic control are none).    NUTRITION - FOCUSED  PHYSICAL EXAM:  Flowsheet Row Most Recent Value  Orbital Region No depletion  Upper Arm Region Mild depletion  Thoracic and Lumbar Region Mild depletion  Buccal Region Mild depletion  Temple Region No depletion  Clavicle Bone Region Mild depletion  Clavicle and Acromion Bone Region Mild depletion  Scapular Bone Region Mild depletion  Dorsal Hand Moderate depletion  Patellar Region Mild depletion  Anterior Thigh Region Mild depletion  Posterior Calf Region Mild depletion  Edema (RD Assessment) None  Hair Reviewed  Eyes Reviewed  Mouth Reviewed  Skin Reviewed  Nails Reviewed    Diet Order:   Diet Order             Diet regular Fluid consistency: Thin  Diet effective now                   EDUCATION NEEDS:   Education needs have been addressed  Skin:  Skin Assessment: Reviewed RN Assessment  Last BM:  11/07/24 (type 5)  Height:   Ht Readings from Last 1 Encounters:  11/01/24 5' (1.524 m)    Weight:   Wt Readings from Last 1 Encounters:  11/01/24 54 kg    Ideal Body Weight:  45.5 kg  BMI:  Body mass index is 23.24 kg/m.  Estimated Nutritional Needs:   Kcal:  1550-1750  Protein:  75-90 grams  Fluid:  1.5-1.7 L    Margery ORN, RD, LDN, CDCES Registered Dietitian III Certified Diabetes Care and Education Specialist If unable to reach this RD, please use RD Inpatient group chat on secure chat between hours of 8am-4 pm daily

## 2024-11-08 NOTE — Telephone Encounter (Signed)
 Patient Product/process Development Scientist completed.    The patient is insured through [employer]. Patient has Medicare and is not eligible for a copay card, but may be able to apply for patient assistance or Medicare RX Payment Plan (Patient Must reach out to their plan, if eligible for payment plan), if available.    Ran test claim for Breztri  and the current 30 day co-pay is $4.90.   This test claim was processed through [facility]- copay amounts may vary at other pharmacies due to pharmacy/plan contracts, or as the patient moves through the different stages of their insurance plan.     Reyes Sharps, CPHT Pharmacy Technician Patient Advocate Specialist Lead [facility] Patient Advocate Team Direct Number: [phone]  Fax: [phone]

## 2024-11-08 NOTE — Progress Notes (Signed)
 Mobility Specialist Progress Note:    11/08/24 1644  Mobility  Activity Refused and notified nurse if applicable   Pt kindly refused mobility, no specific reason. Will re-attempt as appropriate, all needs met.  Sherrilee Ditty Mobility Specialist Please contact via Special Educational Needs Teacher or  Rehab office at [phone]

## 2024-11-08 NOTE — Discharge Instructions (Signed)
Nutrition Post Hospital Stay °Proper nutrition can help your body recover from illness and injury.   °Foods and beverages high in protein, vitamins, and minerals help rebuild muscle loss, promote healing, & reduce fall risk.  ° °•In addition to eating healthy foods, a nutrition shake is an easy, delicious way to get the nutrition you need during and after your hospital stay ° °It is recommended that you continue to drink 2 bottles per day of:       Ensure Plus for at least 1 month (30 days) after your hospital stay  ° °Tips for adding a nutrition shake into your routine: °As allowed, drink one with vitamins or medications instead of water or juice °Enjoy one as a tasty mid-morning or afternoon snack °Drink cold or make a milkshake out of it °Drink one instead of milk with cereal or snacks °Use as a coffee creamer °  °Available at the following grocery stores and pharmacies:           °* [employer] * [employer] * [employer]  °* [employer]          * Walmart * [employer]  °* Wal[facility]reens      * Target  * [employer]J's   °* CVS  * [employer]   °* [facility] [phone]  °          °For COUPONS visit: www.ensure.com/join or www.boost.com/members/sign-up  ° °Suggested Substitutions °Ensure Plus = Boost Plus = Carnation Breakfast Essentials = Boost Compact °Ensure Active Clear = Boost Breeze °Glucerna Shake = Boost Glucose Control = Carnation Breakfast Essentials SUGAR FREE ° °  °

## 2024-11-08 NOTE — Progress Notes (Signed)
 " Progress Note   Patient: Cheryl [name] FMW:[id number] DOB: 07/13/1946 DOA: 11/01/2024     6 DOS: the patient was seen and examined on 11/08/2024   Brief hospital course: H&P HPI: Cheryl [name] is a 79 y.o. female with Past medical history of HTN, HLD, depression, DJD, rheumatoid arthritis as reviewed from EMR, presented with complaining of having cough, shortness of breath and fatigue, generalized body ache.  As per patient she was positive for flu few days ago and she was given treatment for that but it is not helping and her condition was getting worse so she came into the ED.     ED Course: VS afebrile, HR 78, RR 20, BP 98/60, 98% on RA BMP sodium 122, hyponatremic, potassium 3.0, hypokalemia, chloride 95, calcium  8.8, rest within normal range CBC:WBC 19.3, Hb 11.7 CXR: Multifocal pneumonia EKG: Sinus rhythm, nonspecific STW changes QTc 504   Assessment and Plan: # Influenza A pneumonia # CAP (community acquired pneumonia)  Acute resp failure, hypoxic  possible bacterial superinfection Rapid influenza test was positive on 1/2, patient was prescribed Tamiflu  Procalcitonin 0.29.  CTA chest negative for PE, notable for diffuse ground glass opacities  Status post Tamiflu  course S/p ceftriaxone  and doxycycline  Status post 5-day course of steroids O2 req now 3-5L  PrBNP elevated  Repeat CXR  1/12 notable for slightly increased opacities in right upper lobe and persistent opacity at the right lung base  Plan: Given patient's immunosuppression on methotrexate for eczema started empiric treatment for mild PJP with atovaquone  given she had dermatitis rash with bactrim, she is on d3 of this  F/u fungitell F/u pneumocystis PCR, will need PCP follow up and atovaquone  can be d/c'd if this is negative F/u sputum culture  Flutter valve  OOB to chair F/u repeat CXR  Mucinex  600 mg p.o. twice daily Tussionex as needed As needed meds for pain control Wean oxygen as able     Leukocytosis  Secondary to steroids  Polyuria Possible UTI S/p ceftriaxone   Urine culture with no growth  Hypok Replace PRN    Hyponatremia  Unclear etiology possibly due to poor PO intake. Will continue to monitor   Normocytic anemia Iron  deficiency    Latest Ref Rng & Units 11/08/2024    5:11 AM 11/07/2024   10:23 AM 11/06/2024    8:23 AM  CBC  WBC 4.0 - 10.5 K/uL 11.7  15.9  19.0   Hemoglobin 12.0 - 15.0 g/dL 88.6  87.7  88.4   Hematocrit 36.0 - 46.0 % 31.6  34.5  33.0   Platelets 150 - 400 K/uL 259  287  271   Iron /TIBC/Ferritin/ %Sat    Component Value Date/Time   IRON  19 (L) 11/01/2024 1528   TIBC 195 (L) 11/01/2024 1528   IRONPCTSAT 10 (L) 11/01/2024 1528   Hgb stable.  -Iron  supplement   History of eczema On methotrexate outpatient once weekly dosing, this was started 02/2024   HTN, HLD Continue losartan  Resume statin on discharge    Depression and dementia Continued home meds, patient is on multiple medications, polypharmacy, recommend to follow with PCP to reduce polypharmacy as an outpatient   Tobacco use disorder  TOC consulted Enxouraged cessation      Subjective: NO acute events overnight.   Physical Exam: Vitals:   11/07/24 1537 11/07/24 1946 11/08/24 0458 11/08/24 0908  BP: (!) 149/61 134/81 (!) 160/77 (!) 178/83  Pulse: 66 66 61 64  Resp: 18 16 20 19   Temp: 97.9  F (36.6 C) (!) 97.5 F (36.4 C) 97.8 F (36.6 C) 98.3 F (36.8 C)  TempSrc: Oral Oral Axillary   SpO2: 95% 98% 100% 100%  Weight:      Height:          Constitutional: In no distress.  Cardiovascular: Normal rate, regular rhythm. No lower extremity edema  Pulmonary: Non labored breathing on [place], no wheezing or rales.   Abdominal: Soft. Non distended and non tender Musculoskeletal: Normal range of motion.     Neurological: Alert and oriented to person, place, and time. Non focal  Skin: Skin is warm and dry.    Data Reviewed:     Latest Ref Rng & Units  11/08/2024    5:11 AM 11/07/2024   10:23 AM 11/06/2024    8:23 AM  CBC  WBC 4.0 - 10.5 K/uL 11.7  15.9  19.0   Hemoglobin 12.0 - 15.0 g/dL 88.6  87.7  88.4   Hematocrit 36.0 - 46.0 % 31.6  34.5  33.0   Platelets 150 - 400 K/uL 259  287  271       Latest Ref Rng & Units 11/08/2024    5:11 AM 11/07/2024   10:23 AM 11/06/2024    8:23 AM  BMP  Glucose 70 - 99 mg/dL 71  85  889   BUN 8 - 23 mg/dL 19  15  13    Creatinine 0.44 - 1.00 mg/dL 9.53  9.49  9.49   Sodium 135 - 145 mmol/L 127  131  134   Potassium 3.5 - 5.1 mmol/L 4.1  3.7  2.7   Chloride 98 - 111 mmol/L 97  99  98   CO2 22 - 32 mmol/L 22  21  25    Calcium  8.9 - 10.3 mg/dL 8.6  9.1  8.9      Family Communication: None at bedside.   Disposition: Status is: Inpatient Remains inpatient appropriate because: O2 requirement   Planned Discharge Destination: Home    Time spent: 35 minutes  Author: Alban Pepper, MD 11/08/2024 2:28 PM  For on call review [email or web address].  "

## 2024-11-08 NOTE — TOC Progression Note (Signed)
 Transition of Care [facility]) - Progression Note    Patient Details  Name: Cheryl [name] MRN: [id number] Date of Birth: Nov 27, 1945  [facility] CM/SW Contact  Dalia GORMAN Fuse, RN Phone Number: 11/08/2024, 10:47 AM  Clinical Narrative:    Plan to discharge to home with daughters and [facility] (PT/OT/RN/Aide/SW) when medically ready for discharge.  TOC to continue to follow.   Expected Discharge Plan: Home/Self Care Barriers to Discharge: Continued Medical Work up               Expected Discharge Plan and Services   Discharge Planning Services: CM Consult                                           Social Drivers of Health (SDOH) Interventions SDOH Screenings   Food Insecurity: No Food Insecurity (11/01/2024)  Housing: Low Risk (11/01/2024)  Transportation Needs: No Transportation Needs (11/01/2024)  Utilities: Not At Risk (11/01/2024)  Financial Resource Strain: Low Risk  (05/10/2024)   Received from [facility]  Social Connections: Unknown (11/01/2024)  Tobacco Use: High Risk (11/01/2024)    Readmission Risk Interventions     No data to display

## 2024-11-08 NOTE — Progress Notes (Signed)
 Occupational Therapy Treatment Patient Details Name: Cheryl [name] MRN: [id number] DOB: Jan 14, 1946 Today's Date: 11/08/2024   History of present illness Pt is a 79 yo female that presented to the ED for fatigue, SOB, generalized body ache, positive for flu a few days ago but felt she was worsening. PMH of HTN, HLD, depression, DJD, RA.   OT comments  Cheryl [name] was seen for OT treatment on this date. Upon arrival to room pt in bed, agreeable to tx with encouragement, noted to have stool incontinence. Pt requires SBA for BSC t/f, MAX A pericare standing - assist for thoroughness following large stool incontinence. IND don/doff B socks at bed level. IND bridging bed level for simulated dressing tasks. Extensive education with pt/family on d/c recs, DME recs, importance of OOB to chair for functional strengthening. Pt repeatedly refused to end session OOB in chair despite education and family encouragement. Family at bedside reports pt will have 24/7 supervision on d/c. Pt making limited progress toward goals, will continue to follow POC. Discharge recommendation remains appropriate.   SaO2 on room air at rest = 96% SaO2 on room air while ambulating = 86% SaO2 on 2 liters of O2 while ambulating = 91%       If plan is discharge home, recommend the following:  A little help with walking and/or transfers;A little help with bathing/dressing/bathroom;Assistance with cooking/housework;Supervision due to cognitive status;Help with stairs or ramp for entrance   Equipment Recommendations  BSC/3in1;Hospital bed    Recommendations for Other Services      Precautions / Restrictions Precautions Precautions: Fall Recall of Precautions/Restrictions: Impaired Restrictions Weight Bearing Restrictions Per Provider Order: No       Mobility Bed Mobility Overal bed mobility: Modified Independent                  Transfers Overall transfer level: Needs assistance Equipment used:  None Transfers: Sit to/from Stand Sit to Stand: Supervision                 Balance Overall balance assessment: Needs assistance Sitting-balance support: Feet supported Sitting balance-Leahy Scale: Good     Standing balance support: No upper extremity supported, During functional activity Standing balance-Leahy Scale: Fair                             ADL either performed or assessed with clinical judgement   ADL Overall ADL's : Needs assistance/impaired                                       General ADL Comments: SBA for BSC t/f, MAX A pericare standing - assist for thoroughness following large stool incontinence. IND don/doff B socks at bed level. IND bridging bed level for simulated dressing tasks    Extremity/Trunk Assessment              Vision       Perception     Praxis     Communication Communication Communication: No apparent difficulties   Cognition Arousal: Alert Behavior During Therapy: Flat affect Cognition: Cognition impaired   Orientation impairments: Situation   Memory impairment (select all impairments): Short-term memory     OT - Cognition Comments: cues for orientation to time.                 Following commands: Intact  Cueing   Cueing Techniques: Verbal cues  Exercises      Shoulder Instructions       General Comments      Pertinent Vitals/ Pain       Pain Assessment Pain Assessment: No/denies pain   Frequency  Min 2X/week        Progress Toward Goals  OT Goals(current goals can now be found in the care plan section)  Progress towards OT goals: Progressing toward goals  Acute Rehab OT Goals OT Goal Formulation: With patient Time For Goal Achievement: 11/17/24 Potential to Achieve Goals: Good ADL Goals Pt Will Perform Grooming: with supervision;standing Pt Will Perform Lower Body Dressing: with supervision;sit to/from stand Pt Will Transfer to Toilet: with  supervision;ambulating;bedside commode  Plan      Co-evaluation                 AM-PAC OT 6 Clicks Daily Activity     Outcome Measure   Help from another person eating meals?: None Help from another person taking care of personal grooming?: None Help from another person toileting, which includes using toliet, bedpan, or urinal?: A Little Help from another person bathing (including washing, rinsing, drying)?: A Little Help from another person to put on and taking off regular upper body clothing?: None Help from another person to put on and taking off regular lower body clothing?: A Little 6 Click Score: 21    End of Session Equipment Utilized During Treatment: Oxygen  OT Visit Diagnosis: Unsteadiness on feet (R26.81);Muscle weakness (generalized) (M62.81)   Activity Tolerance Patient tolerated treatment well   Patient Left in bed;with call bell/phone within reach;with family/visitor present   Nurse Communication Mobility status        Time: 8949-8875 OT Time Calculation (min): 34 min  Charges: OT General Charges $OT Visit: 1 Visit OT Treatments $Self Care/Home Management : 23-37 mins  Elston Slot, M.S. OTR/L  11/08/2024, 11:50 AM  ascom [phone]

## 2024-11-09 DIAGNOSIS — J189 Pneumonia, unspecified organism: Secondary | ICD-10-CM | POA: Diagnosis not present

## 2024-11-09 LAB — BASIC METABOLIC PANEL WITH GFR
Anion gap: 9 (ref 5–15)
BUN: 22 mg/dL (ref 8–23)
CO2: 26 mmol/L (ref 22–32)
Calcium: 8.9 mg/dL (ref 8.9–10.3)
Chloride: 94 mmol/L — ABNORMAL LOW (ref 98–111)
Creatinine, Ser: 0.49 mg/dL (ref 0.44–1.00)
GFR, Estimated: >60 mL/min
Glucose, Bld: 93 mg/dL (ref 70–99)
Potassium: 4 mmol/L (ref 3.5–5.1)
Sodium: 130 mmol/L — ABNORMAL LOW (ref 135–145)

## 2024-11-09 LAB — GLUCOSE, CAPILLARY
Glucose-Capillary: 91 mg/dL (ref 70–99)
Glucose-Capillary: 118 mg/dL — ABNORMAL HIGH (ref 70–99)
Glucose-Capillary: 90 mg/dL (ref 70–99)
Glucose-Capillary: 119 mg/dL — ABNORMAL HIGH (ref 70–99)

## 2024-11-09 LAB — CBC WITH DIFFERENTIAL/PLATELET
Abs Immature Granulocytes: 1.1 K/uL — ABNORMAL HIGH (ref 0.00–0.07)
Basophils Absolute: 0.1 K/uL (ref 0.0–0.1)
Basophils Relative: 1 %
Eosinophils Absolute: 0.2 K/uL (ref 0.0–0.5)
Eosinophils Relative: 2 %
HCT: 34.4 % — ABNORMAL LOW (ref 36.0–46.0)
Hemoglobin: 11.9 g/dL — ABNORMAL LOW (ref 12.0–15.0)
Immature Granulocytes: 10 %
Lymphocytes Relative: 20 %
Lymphs Abs: 2.2 K/uL (ref 0.7–4.0)
MCH: 29.4 pg (ref 26.0–34.0)
MCHC: 34.6 g/dL (ref 30.0–36.0)
MCV: 84.9 fL (ref 80.0–100.0)
Monocytes Absolute: 0.5 K/uL (ref 0.1–1.0)
Monocytes Relative: 5 %
Neutro Abs: 6.5 K/uL (ref 1.7–7.7)
Neutrophils Relative %: 62 %
Platelets: 271 K/uL (ref 150–400)
RBC: 4.05 MIL/uL (ref 3.87–5.11)
RDW: 16.9 % — ABNORMAL HIGH (ref 11.5–15.5)
Smear Review: NORMAL
WBC: 10.6 K/uL — ABNORMAL HIGH (ref 4.0–10.5)
nRBC: 0 % (ref 0.0–0.2)

## 2024-11-09 LAB — FUNGITELL BETA-D-GLUCAN: Fungitell Value:: <31.25 pg/mL

## 2024-11-09 LAB — MAGNESIUM: Magnesium: 2.3 mg/dL (ref 1.7–2.4)

## 2024-11-09 LAB — OSMOLALITY: Osmolality: 272 mosm/kg — ABNORMAL LOW (ref 275–295)

## 2024-11-09 LAB — PHOSPHORUS: Phosphorus: 4 mg/dL (ref 2.5–4.6)

## 2024-11-09 MED ORDER — SODIUM CHLORIDE 1 G PO TABS
2.0000 g | ORAL_TABLET | Freq: Three times a day (TID) | ORAL | Status: DC
  Administered 2024-11-09 – 2024-11-10 (×3): 2 g via ORAL
  Filled 2024-11-09 (×3): qty 2

## 2024-11-09 NOTE — Care Management Important Message (Signed)
 Important Message  Patient Details  Name: EUSEBIA GRULKE MRN: [id number] Date of Birth: [birth year]   Important Message Given:  Yes - Medicare IM     Nejla Reasor W, CMA 11/09/2024, 2:48 PM

## 2024-11-09 NOTE — Progress Notes (Signed)
 [facility] Progress Note  Patient: Cheryl [name]    FMW:[id number]  DOA: 11/01/2024     Date of Service: the patient was seen and examined on 11/09/2024  Chief Complaint  Patient presents with   Influenza   Brief hospital course: H&P HPI: Cheryl [name] is a 79 y.o. female with Past medical history of HTN, HLD, depression, DJD, rheumatoid arthritis as reviewed from EMR, presented with complaining of having cough, shortness of breath and fatigue, generalized body ache.  As per patient she was positive for flu few days ago and she was given treatment for that but it is not helping and her condition was getting worse so she came into the ED.     ED Course: VS afebrile, HR 78, RR 20, BP 98/60, 98% on RA BMP sodium 122, hyponatremic, potassium 3.0, hypokalemia, chloride 95, calcium  8.8, rest within normal range CBC:WBC 19.3, Hb 11.7 CXR: Multifocal pneumonia EKG: Sinus rhythm, nonspecific STW changes QTc 504    Assessment and Plan:  # Influenza A pneumonia # CAP (community acquired pneumonia)  Acute resp failure, hypoxic  possible bacterial superinfection Rapid influenza test was positive on 1/2, patient was prescribed Tamiflu  Procalcitonin 0.29.  CTA chest negative for PE, notable for diffuse ground glass opacities   Status post Tamiflu  course S/p ceftriaxone  and doxycycline  Status post 5-day course of steroids O2 req now 3-5L  PrBNP elevated  Repeat CXR  1/12 notable for slightly increased opacities in right upper lobe and persistent opacity at the right lung base   Plan: Given patient's immunosuppression on methotrexate for eczema started empiric treatment for mild PJP with atovaquone  given she had dermatitis rash with bactrim, she is on d3 of this  F/u fungitell F/u pneumocystis PCR, will need PCP follow up and atovaquone  can be d/c'd if this is negative F/u sputum culture  Flutter valve  OOB to chair F/u repeat CXR  Mucinex  600 mg p.o. twice daily Tussionex as  needed As needed meds for pain control Wean oxygen as able     Leukocytosis  Secondary to steroids   Polyuria Possible UTI S/p ceftriaxone   Urine culture with no growth   Hypok Replace PRN     Hypotonic hyponatremia  Unclear etiology possibly due to poor PO intake. Will continue to monitor  Serum osmolality 272, low Na 130 monitor sodium levels and  Normocytic anemia Iron  deficiency  ron/TIBC/Ferritin/ %Sat Hgb stable.  -Iron  supplement     History of eczema On methotrexate outpatient once weekly dosing, this was started 02/2024   HTN, HLD Continue losartan  Resume statin on discharge    Depression and dementia Continued home meds, patient is on multiple medications, polypharmacy, recommend to follow with PCP to reduce polypharmacy as an outpatient     Tobacco use disorder  TOC consulted Enxouraged cessation       Body mass index is 23.24 kg/m.  Nutrition Problem: Moderate Malnutrition Etiology: social / environmental circumstances Interventions: Interventions: Ensure Enlive (each supplement provides 350kcal and 20 grams of protein), Magic cup, MVI, Liberalize Diet   Diet: Regular diet DVT Prophylaxis: Subcutaneous Lovenox    Advance goals of care discussion: Full code  Family Communication: family was not present at bedside, at the time of interview.  The pt provided permission to discuss medical plan with the family. Opportunity was given to ask question and all questions were answered satisfactorily.  1/14 d/w family over the phone  Disposition:  Pt is from home, admitted with respiratory failure due to influenza  A viral infection, still has supplemental O2 inhalation, which precludes a safe discharge. Discharge to home, when stable, most likely tomorrow a.m.  Subjective: No significant events overnight.  Patient still having some congestion, mild shortness of breath and cough with phlegm production. Denied any chest pain hypoplasty, no any other  complaints.  Physical Exam: General: NAD, lying comfortably Appear in no distress, affect appropriate Eyes: PERRLA ENT: Oral Mucosa Clear, moist  Neck: no JVD,  Cardiovascular: S1 and S2 Present, no Murmur,  Respiratory: good air entry bilaterally, mild bilateral crackles and minimal wheezes Abdomen: Bowel Sound present, Soft and no tenderness,  Skin: no rashes Extremities: no Pedal edema, no calf tenderness Neurologic: without any new focal findings Gait not checked due to patient safety concerns  Vitals:   11/08/24 1953 11/09/24 0442 11/09/24 0851 11/09/24 1600  BP: (!) 148/69 138/70 117/64 (!) 145/69  Pulse: 76 72 63 70  Resp: 20 18 20 18   Temp: 98.5 F (36.9 C) 97.9 F (36.6 C) 97.6 F (36.4 C)   TempSrc: Oral Oral    SpO2: 95% 95% 97% 91%  Weight:      Height:        Intake/Output Summary (Last 24 hours) at 11/09/2024 1650 Last data filed at 11/09/2024 1300 Gross per 24 hour  Intake 720 ml  Output --  Net 720 ml   Filed Weights   11/01/24 1026 11/01/24 1551  Weight: 46.7 kg 54 kg    Data Reviewed: I have personally reviewed and interpreted daily labs, tele strips, imagings as discussed above. I reviewed all nursing notes, pharmacy notes, vitals, pertinent old records I have discussed plan of care as described above with RN and patient/family.  CBC: Recent Labs  Lab 11/05/24 0616 11/06/24 0823 11/07/24 1023 11/08/24 0511 11/09/24 0444  WBC 20.4* 19.0* 15.9* 11.7* 10.6*  NEUTROABS 16.2* 15.7* 12.0* 8.0* 6.5  HGB 10.6* 11.5* 12.2 11.3* 11.9*  HCT 30.1* 33.0* 34.5* 31.6* 34.4*  MCV 83.8 86.4 85.0 84.3 84.9  PLT 267 271 287 259 271   Basic Metabolic Panel: Recent Labs  Lab 11/05/24 0616 11/06/24 0823 11/07/24 1023 11/08/24 0511 11/09/24 0444  NA 135 134* 131* 127* 130*  K 3.1* 2.7* 3.7 4.1 4.0  CL 100 98 99 97* 94*  CO2 24 25 21* 22 26  GLUCOSE 66* 110* 85 71 93  BUN 11 13 15 19 22   CREATININE 0.44 0.50 0.50 0.46 0.49  CALCIUM  9.1 8.9 9.1  8.6* 8.9  MG  --   --  2.2  --  2.3  PHOS  --   --   --   --  4.0    Studies: No results found.  Scheduled Meds:  atovaquone   750 mg Oral BID WC   benzonatate   200 mg Oral TID   brimonidine   1 drop Both Eyes BID   And   timolol   1 drop Both Eyes BID   budesonide -glycopyrrolate -formoterol   2 puff Inhalation BID   busPIRone   7.5 mg Oral BID   donepezil   10 mg Oral QHS   enoxaparin  (LOVENOX ) injection  40 mg Subcutaneous Daily   feeding supplement  237 mL Oral TID BM   ferrous sulfate   325 mg Oral Q breakfast   folic acid   1 mg Oral Daily   guaiFENesin   600 mg Oral BID   latanoprost   1 drop Both Eyes QHS   loratadine   10 mg Oral Daily   losartan   100 mg Oral Daily   multivitamin  with minerals  1 tablet Oral Daily   pantoprazole   40 mg Oral Daily   sertraline   100 mg Oral QHS   sodium chloride  flush  3 mL Intravenous Q12H   thiamine   100 mg Oral Daily   venlafaxine  XR  150 mg Oral Daily   Continuous Infusions: PRN Meds: acetaminophen  **OR** acetaminophen , bisacodyl , chlorpheniramine-HYDROcodone, hydrALAZINE , HYDROmorphone  (DILAUDID ) injection, ipratropium-albuterol , LORazepam , ondansetron  **OR** ondansetron  (ZOFRAN ) IV, oxyCODONE , sodium chloride  flush  Time spent: 35 minutes  Author: ELVAN SOR. MD [facility] 11/09/2024 4:50 PM  To reach On-call, see care teams to locate the attending and reach out to them via [email or web address]. If 7PM-7AM, please contact night-coverage If you still have difficulty reaching the attending provider, please page the [facility] (Director on Call) for [facility] on amion for assistance.

## 2024-11-09 NOTE — Progress Notes (Signed)
 Physical Therapy Treatment Patient Details Name: Cheryl [name] MRN: [id number] DOB: 08-25-1946 Today's Date: 11/09/2024   History of Present Illness Pt is a 79 yo female that presented to the ED for fatigue, SOB, generalized body ache, positive for flu a few days ago but felt she was worsening. PMH of HTN, HLD, depression, DJD, RA.    PT Comments  Pt pleasantly confused t/o session, does need extra encouragement to motivated for working with PT.   She did comprehend that getting up to recliner is best for her; sister was present and helped with motivation.   Pt was able to ambulate to the door and back and though she had a slow and guarded ambulation effort she did not have any LOBs or excessive fatigue (O2 dropped to low 90s on room air) but she does endorse subjective fatigue.  Pt is not at her baseline but continues to make improvement.  Will benefit from ongoing  PT to address functional limitations, continue with POC.    If plan is discharge home, recommend the following: A little help with walking and/or transfers;A little help with bathing/dressing/bathroom;Assistance with cooking/housework;Assist for transportation;Help with stairs or ramp for entrance   Can travel by private vehicle        Equipment Recommendations  None recommended by PT    Recommendations for Other Services       Precautions / Restrictions Precautions Precautions: Fall Recall of Precautions/Restrictions: Impaired Restrictions Weight Bearing Restrictions Per Provider Order: No     Mobility  Bed Mobility Overal bed mobility: Needs Assistance Bed Mobility: Supine to Sit           General bed mobility comments: able to work LEs toward EOB w/o assist, did need HHA pull and physical assist to get upright    Transfers Overall transfer level: Needs assistance Equipment used: Rolling walker (2 wheels) Transfers: Sit to/from Stand Sit to Stand: Min assist           General transfer comment: cuing  and    Ambulation/Gait Ambulation/Gait assistance: Min assist Gait Distance (Feet): 25 Feet Assistive device: Rolling walker (2 wheels)         General Gait Details: Pt with slow and shuffling gait, appropriate WBing through walker but needing assist to keep it positioned well and for directional awareness.  No LOBs, mild fatigue (SpO2 drops from high to low 90s on 2L)   Stairs             Wheelchair Mobility     Tilt Bed    Modified Rankin (Stroke Patients Only)       Balance Overall balance assessment: Needs assistance Sitting-balance support: Feet supported Sitting balance-Leahy Scale: Good       Standing balance-Leahy Scale: Good Standing balance comment: needing basic cuing and heavy walker use, but no LOBs or overt balance issues                            Communication Communication Communication: No apparent difficulties  Cognition Arousal: Alert Behavior During Therapy: Flat affect   PT - Cognitive impairments: History of cognitive impairments                         Following commands: Intact      Cueing Cueing Techniques: Verbal cues, Gestural cues  Exercises      General Comments General comments (skin integrity, edema, etc.): on 2L t/o session, c/o  fatigue with modest ambulation effort      Pertinent Vitals/Pain Pain Assessment Pain Assessment: No/denies pain    Home Living                          Prior Function            PT Goals (current goals can now be found in the care plan section) Progress towards PT goals: Progressing toward goals    Frequency    Min 2X/week      PT Plan      Co-evaluation              AM-PAC PT 6 Clicks Mobility   Outcome Measure  Help needed turning from your back to your side while in a flat bed without using bedrails?: A Little Help needed moving from lying on your back to sitting on the side of a flat bed without using bedrails?: A  Little Help needed moving to and from a bed to a chair (including a wheelchair)?: A Little Help needed standing up from a chair using your arms (e.g., wheelchair or bedside chair)?: A Little Help needed to walk in hospital room?: A Little Help needed climbing 3-5 steps with a railing? : A Lot 6 Click Score: 17    End of Session Equipment Utilized During Treatment: Oxygen Activity Tolerance: Patient limited by fatigue;Other (comment) Patient left: with chair alarm set;with call bell/phone within reach;with family/visitor present   PT Visit Diagnosis: Other abnormalities of gait and mobility (R26.89);Difficulty in walking, not elsewhere classified (R26.2);Muscle weakness (generalized) (M62.81)     Time: 1212-1230 PT Time Calculation (min) (ACUTE ONLY): 18 min  Charges:    $Gait Training: 8-22 mins PT General Charges $$ ACUTE PT VISIT: 1 Visit                     Carmin JONELLE Deed, DPT 11/09/2024, 2:50 PM

## 2024-11-09 NOTE — Progress Notes (Signed)
 SPIRITUAL CARE AND COUNSELING CONSULT NOTE   SPIRITUAL ENCOUNTER                                                                                                                                                                      Type of Visit: Initial Care provided to:: Family Referral source: Other (comment) Reason for visit: Religious ritual OnCall Visit: No   SPIRITUAL FRAMEWORK  Presenting Themes: Impactful experiences and emotions, Rituals and practive, Community and relationships Values/beliefs: faith Community/Connection: Family Strengths: faith Needs/Challenges/Barriers: sister long-distance from [place]  Patient Stress Factors: Health changes Family Stress Factors: Exhausted, Lack of caregivers   GOALS       INTERVENTIONS   Spiritual Care Interventions Made: Prayer, Reflective listening, Compassionate presence    INTERVENTION OUTCOMES   Outcomes: Reduced isolation, Connection to spiritual care  SPIRITUAL CARE PLAN   Spiritual Care Issues Still Outstanding: Referring to oncoming chaplain for further support    If immediate needs arise, please contact [facility] 24 hour on call [phone]   Barnie JINNY Record, Chaplain  11/09/2024 1:47 PM

## 2024-11-09 NOTE — TOC Progression Note (Addendum)
 Transition of Care [facility]) - Progression Note    Patient Details  Name: Cheryl [name] MRN: [id number] Date of Birth: [birth year]  [facility] CM/SW Contact  Dalia GORMAN Fuse, RN Phone Number: 11/09/2024, 10:29 AM  Clinical Narrative:    Day 4 of  empiric treatment for mild PJP with atovaquone  given she had dermatitis rash with bactrim.  Plan to dc to home with max HH services (PT/OT/RN/Aide/SW) and Home O2.  Expected Discharge Plan: Home/Self Care Barriers to Discharge: Continued Medical Work up               Expected Discharge Plan and Services   Discharge Planning Services: CM Consult                                           Social Drivers of Health (SDOH) Interventions SDOH Screenings   Food Insecurity: No Food Insecurity (11/01/2024)  Housing: Low Risk (11/01/2024)  Transportation Needs: No Transportation Needs (11/01/2024)  Utilities: Not At Risk (11/01/2024)  Financial Resource Strain: Low Risk  (05/10/2024)   Received from [facility]  Social Connections: Unknown (11/01/2024)  Tobacco Use: High Risk (11/01/2024)    Readmission Risk Interventions     No data to display

## 2024-11-09 NOTE — Plan of Care (Signed)

## 2024-11-10 ENCOUNTER — Other Ambulatory Visit: Payer: Self-pay

## 2024-11-10 DIAGNOSIS — J189 Pneumonia, unspecified organism: Secondary | ICD-10-CM | POA: Diagnosis not present

## 2024-11-10 LAB — CBC
HCT: 32.4 % — ABNORMAL LOW (ref 36.0–46.0)
Hemoglobin: 11.4 g/dL — ABNORMAL LOW (ref 12.0–15.0)
MCH: 30.5 pg (ref 26.0–34.0)
MCHC: 35.2 g/dL (ref 30.0–36.0)
MCV: 86.6 fL (ref 80.0–100.0)
Platelets: 299 K/uL (ref 150–400)
RBC: 3.74 MIL/uL — ABNORMAL LOW (ref 3.87–5.11)
RDW: 17.4 % — ABNORMAL HIGH (ref 11.5–15.5)
WBC: 13 K/uL — ABNORMAL HIGH (ref 4.0–10.5)
nRBC: 0 % (ref 0.0–0.2)

## 2024-11-10 LAB — GLUCOSE, CAPILLARY
Glucose-Capillary: 101 mg/dL — ABNORMAL HIGH (ref 70–99)
Glucose-Capillary: 100 mg/dL — ABNORMAL HIGH (ref 70–99)

## 2024-11-10 LAB — BASIC METABOLIC PANEL WITH GFR
Anion gap: 8 (ref 5–15)
BUN: 23 mg/dL (ref 8–23)
CO2: 27 mmol/L (ref 22–32)
Calcium: 8.8 mg/dL — ABNORMAL LOW (ref 8.9–10.3)
Chloride: 97 mmol/L — ABNORMAL LOW (ref 98–111)
Creatinine, Ser: 0.49 mg/dL (ref 0.44–1.00)
GFR, Estimated: >60 mL/min
Glucose, Bld: 95 mg/dL (ref 70–99)
Potassium: 4.3 mmol/L (ref 3.5–5.1)
Sodium: 132 mmol/L — ABNORMAL LOW (ref 135–145)

## 2024-11-10 LAB — CULTURE, RESPIRATORY W GRAM STAIN: Culture: NORMAL

## 2024-11-10 LAB — PHOSPHORUS: Phosphorus: 3.3 mg/dL (ref 2.5–4.6)

## 2024-11-10 LAB — PROCALCITONIN: Procalcitonin: 0.11 ng/mL

## 2024-11-10 LAB — MAGNESIUM: Magnesium: 2.4 mg/dL (ref 1.7–2.4)

## 2024-11-10 MED ORDER — DEXTROMETHORPHAN-GUAIFENESIN 20-200 MG/20ML PO LIQD
10.0000 mL | Freq: Four times a day (QID) | ORAL | 0 refills | Status: AC | PRN
  Filled 2024-11-10: qty 118, 2d supply, fill #0

## 2024-11-10 MED ORDER — VITAMIN C 250 MG PO CHEW
250.0000 mg | CHEWABLE_TABLET | Freq: Every day | ORAL | 3 refills | Status: AC
  Filled 2024-11-10: qty 100, 100d supply, fill #0

## 2024-11-10 MED ORDER — POLYSACCHARIDE IRON COMPLEX 150 MG PO CAPS
150.0000 mg | ORAL_CAPSULE | Freq: Every day | ORAL | 0 refills | Status: AC
  Filled 2024-11-10: qty 90, 90d supply, fill #0

## 2024-11-10 NOTE — Discharge Summary (Signed)
 [facility] Discharge Summary   Patient: Cheryl [name] FMW:[id number]  PCP: Tobie Domino, MD  Date of admission: 11/01/2024   Date of discharge:  11/10/2024     Discharge Diagnoses:  Principal Problem:   CAP (community acquired pneumonia) Active Problems:   Malnutrition of moderate degree   Admitted From: Home Disposition:  Home with services  Recommendations for Outpatient Follow-up:  F/u with PCP in 1 wk Follow up LABS/TEST:  CBC and BMP in 1 wk CXR in 4 wks   Follow-up Information     Tobie Domino, MD. Go on 12/12/2024.   Specialty: Family Medicine Why: @ 2:20PM hospital follow up Contact information: [address] [place] [address] [phone]                Diet recommendation: Cardiac diet  Activity: The patient is advised to gradually reintroduce usual activities, as tolerated  Discharge Condition: stable  Code Status: Full code   History of present illness: As per the H and P dictated on admission.  Hospital Course:  H&P HPI: Cheryl [name] is a 79 y.o. female with Past medical history of HTN, HLD, depression, DJD, rheumatoid arthritis as reviewed from EMR, presented with complaining of having cough, shortness of breath and fatigue, generalized body ache.  As per patient she was positive for flu few days ago and she was given treatment for that but it is not helping and her condition was getting worse so she came into the ED.     ED Course: VS afebrile, HR 78, RR 20, BP 98/60, 98% on RA BMP sodium 122, hyponatremic, potassium 3.0, hypokalemia, chloride 95, calcium  8.8, rest within normal range CBC:WBC 19.3, Hb 11.7 CXR: Multifocal pneumonia EKG: Sinus rhythm, nonspecific STW changes QTc 504     Assessment and Plan:   # Influenza A pneumonia # CAP (community acquired pneumonia)  Acute resp failure, hypoxic: Resolved possible bacterial superinfection Rapid influenza test was positive on 1/2, patient was prescribed  Tamiflu  Procalcitonin 0.29.  CTA chest negative for PE, notable for diffuse ground glass opacities Status post Tamiflu  course. S/p ceftriaxone  and doxycycline  Status post 5-day course of steroids. PrBNP elevated  Repeat CXR  1/12 notable for slightly increased opacities in right upper lobe and persistent opacity at the right lung base Given patient's immunosuppression on methotrexate for eczema patient was given empiric treatment for mild PJP with atovaquone , because she had dermatitis rash with bactrim. pneumocystis PCR is still in process.  Patient was advised to follow with PCP to follow pending report.  Breathing improved, respiratory failure resolved.  Currently patient is saturating well on room air.  Atovaquone  discontinued sputum culture negative. S/p Flutter valve. S/p Mucinex  600 mg p.o. twice daily, Tussionex as needed.  Continue Robitussin DM as needed for cough Follow-up with PCP, repeat chest x-ray after 4 weeks.   # Leukocytosis: Secondary to steroids.  Less likely infection.  Procalcitonin negative.   # Polyuria, Possible UTI, s/p ceftriaxone   Urine culture with no growth   # Hypokalemia, potassium repleted.  Resolved.   # Hypotonic hyponatremia  Unclear etiology possibly due to poor PO intake. Will continue to monitor  Serum osmolality 272, low. Na 130>>132 follow with PCP, repeat BMP after 1 week.  # Normocytic anemia: Iron  deficiency, Tsat 10%.  Continue oral iron  supplement.  Follow in between 3 to 6 months.  # History of eczema On methotrexate outpatient once weekly dosing, this was started 02/2024   # HTN, HLD: Continue  losartan  and statin on discharge    # Depression and dementia Continued home meds, patient is on multiple medications, polypharmacy, recommend to follow with PCP to reduce polypharmacy as an outpatient   Tobacco use disorder:  Enxouraged cessation    Body mass index is 23.24 kg/m.  Nutrition Problem: Moderate Malnutrition Etiology: social /  environmental circumstances Nutrition Interventions: Interventions: Ensure Enlive (each supplement provides 350kcal and 20 grams of protein), Magic cup, MVI, Liberalize Diet   - Patient was instructed, not to drive, operate heavy machinery, perform activities at heights, swimming or participation in water  activities or provide baby sitting services while on Pain, Sleep and Anxiety Medications; until her outpatient Physician has advised to do so again.  - Also recommended to not to take more than prescribed Pain, Sleep and Anxiety Medications.  Patient was seen by physical therapy, who recommended Home health, which was arranged. On the day of the discharge the patient's vitals were stable, and no other acute medical condition were reported by patient. the patient was felt safe to be discharge at Home with Home health.  Consultants: None Procedures: None  Discharge Exam: General: Appear in no distress, Oral Mucosa Clear, moist. Cardiovascular: S1 and S2 Present, no Murmur, Respiratory: normal respiratory effort, Bilateral Air entry present and no Crackles, no wheezes Abdomen: Bowel Sound present, Soft and no tenderness. Extremities: no Pedal edema, no calf tenderness Neurology: alert and oriented to time, place, and person affect appropriate.  Filed Weights   11/01/24 1026 11/01/24 1551  Weight: 46.7 kg 54 kg   Vitals:   11/10/24 0358 11/10/24 0800  BP: (!) 132/54 139/65  Pulse: 62 60  Resp: 15 18  Temp: 97.8 F (36.6 C) 97.8 F (36.6 C)  SpO2: 100% 98%    DISCHARGE MEDICATION: Allergies as of 11/10/2024       Reactions   Ceftriaxone  Dermatitis   Cephalexin  Dermatitis   Sulfamethoxazole-trimethoprim Dermatitis   Other Reaction(s): felt shaky while taking   Gramineae Pollens Other (See Comments)   Other Reaction(s): Other (See Comments)   Doxycycline  Nausea Only, Palpitations   Shaky   Hydroxychloroquine Dermatitis   Sulfa Antibiotics Nausea Only   Shaking          Medication List     STOP taking these medications    benzonatate  200 MG capsule Commonly known as: TESSALON    oseltamivir  75 MG capsule Commonly known as: TAMIFLU        TAKE these medications    albuterol  108 (90 Base) MCG/ACT inhaler Commonly known as: VENTOLIN  HFA   alendronate 70 MG tablet Commonly known as: FOSAMAX 1 tablet Orally once a week; Duration: 30 day(s)   atorvastatin  80 MG tablet Commonly known as: LIPITOR Take 80 mg by mouth at bedtime.   brimonidine -timolol  0.2-0.5 % ophthalmic solution Commonly known as: COMBIGAN  Place 1 drop into both eyes every 12 (twelve) hours.   busPIRone  7.5 MG tablet Commonly known as: BUSPAR  Take 7.5 mg by mouth 2 (two) times daily.   cetirizine  10 MG tablet Commonly known as: ZYRTEC  Take 1 tablet by mouth twice daily What changed: when to take this   clobetasol ointment 0.05 % Commonly known as: TEMOVATE Apply the medication twice daily to most severely affected areas of the skin until smooth and flat. Then stop and re-start if needed. Avoid use on the face.   donepezil  10 MG tablet Commonly known as: ARICEPT  Take 10 mg by mouth at bedtime.   esterified estrogens 0.625 MG tablet Commonly known as:  MENEST Take 0.625 mg by mouth daily.   estradiol  0.01 % Crea vaginal cream Commonly known as: ESTRACE  Apply one pea-sized amount around the opening of the urethra daily for 30 days, then 3 times weekly moving forward.   feeding supplement Liqd Take 237 mLs by mouth 2 (two) times daily between meals.   folic acid  1 MG tablet Commonly known as: FOLVITE  Take 1 mg by mouth daily.   guaiFENesin -dextromethorphan  100-10 MG/5ML syrup Commonly known as: ROBITUSSIN DM Take 10 mLs by mouth every 6 (six) hours as needed for cough.   hydrocortisone  2.5 % ointment Apply topically 2 (two) times daily. Apply to aa's rash BID PRN.   ipratropium 0.06 % nasal spray Commonly known as: ATROVENT Place 2 sprays into the nose 4  (four) times daily.   iron  polysaccharides 150 MG capsule Commonly known as: NIFEREX Take 1 capsule (150 mg total) by mouth daily.   latanoprost  0.005 % ophthalmic solution Commonly known as: XALATAN  Place 1 drop into both eyes at bedtime.   LORazepam  0.5 MG tablet Commonly known as: ATIVAN  2 tablets Orally Once a day as needed   losartan  100 MG tablet Commonly known as: COZAAR  Take 100 mg by mouth daily.   methotrexate 2.5 MG tablet Commonly known as: RHEUMATREX Take 12.5 mg by mouth every Friday.   nystatin 100000 UNIT/ML suspension Commonly known as: MYCOSTATIN SWISH AND SPIT USING BY MOUTH FOUR TIMES DAILY. CONTINUE FOR TWO DAYS AFTER SYMPTOMS ARE GONE   pantoprazole  40 MG tablet Commonly known as: PROTONIX  Take 40 mg by mouth daily.   permethrin  5 % cream Commonly known as: ELIMITE  APPLY EVERYWHERE FROM THE NECK DOWN. LET SIT OVERNIGHT AND WASH OFF IN THE MORNING. REPEAT IN ONE WEEK   polyethylene glycol 17 g packet Commonly known as: MIRALAX  / GLYCOLAX  Take 17 g by mouth daily.   ReliOn All-In-One Devi RELION ALL-IN-ONE DEVI   sertraline  50 MG tablet Commonly known as: ZOLOFT  Take 100 mg by mouth at bedtime.   Simbrinza 1-0.2 % Susp Generic drug: Brinzolamide-Brimonidine    triamcinolone  ointment 0.1 % Commonly known as: KENALOG  Apply the medication twice daily (5 grams) to affected areas of the skin until smooth.   venlafaxine  XR 150 MG 24 hr capsule Commonly known as: EFFEXOR -XR Take 150 mg by mouth daily.   vitamin C  250 MG tablet Commonly known as: ASCORBIC ACID  Take 1 tablet (250 mg total) by mouth daily.   ZINC  PO Take 1 tablet by mouth daily.               Durable Medical Equipment  (From admission, onward)           Start     Ordered   11/08/24 1216  For home use only DME oxygen  Once       Question Answer Comment  Length of Need 6 Months   Mode or (Route) Nasal cannula   Liters per Minute 3   Oxygen delivery system:  Gas   Oxygen delivery system: Portable concentrator (POC)      11/08/24 1215           Allergies[1] Discharge Instructions     Call MD for:  difficulty breathing, headache or visual disturbances   Complete by: As directed    Call MD for:  extreme fatigue   Complete by: As directed    Call MD for:  persistant dizziness or light-headedness   Complete by: As directed    Call MD for:  persistant  nausea and vomiting   Complete by: As directed    Call MD for:  severe uncontrolled pain   Complete by: As directed    Call MD for:  temperature >100.4   Complete by: As directed    Discharge instructions   Complete by: As directed    F/u with PCP in 1 wk CBC and BMP in 1 wk CXR in 4 wks   Increase activity slowly   Complete by: As directed        The results of significant diagnostics from this hospitalization (including imaging, microbiology, ancillary and laboratory) are listed below for reference.    Significant Diagnostic Studies: DG Chest 2 View Result Date: 11/07/2024 EXAM: 2 VIEW(S) XRAY OF THE CHEST 11/07/2024 08:22:00 AM COMPARISON: 11/01/2024 CLINICAL HISTORY: Oxygen desaturation FINDINGS: LUNGS AND PLEURA: Slightly increased patchy opacities within the right upper lobe. Persistent opacity at the right lung base. Diffusely increased interstitial markings. No pleural effusion. No pneumothorax. HEART AND MEDIASTINUM: Aortic arch atherosclerosis. No acute abnormality of the cardiac and mediastinal silhouettes. BONES AND SOFT TISSUES: No acute osseous abnormality. IMPRESSION: 1. Slightly increased patchy opacities within the right upper lobe and persistent opacity at the right lung base. 2. Diffusely increased interstitial markings. Electronically signed by: Oneil Devonshire MD MD 11/07/2024 10:26 PM EST RP Workstation: [id number]   CT Angio Chest Pulmonary Embolism (PE) W or WO Contrast Result Date: 11/04/2024 EXAM: CTA of the Chest with contrast for PE 11/04/2024 06:02:40 PM  TECHNIQUE: CTA of the chest was performed without and with the administration of 75 mL of iohexol  (OMNIPAQUE ) 350 MG/ML injection. Multiplanar reformatted images are provided for review. MIP images are provided for review. Automated exposure control, iterative reconstruction, and/or weight based adjustment of the mA/kV was utilized to reduce the radiation dose to as low as reasonably achievable. COMPARISON: None available. CLINICAL HISTORY: Pulmonary embolism (PE) suspected, low to intermediate probability, positive D-dimer. FINDINGS: PULMONARY ARTERIES: Pulmonary arteries are adequately opacified for evaluation. No pulmonary embolism. Main pulmonary artery is normal in caliber. MEDIASTINUM: The heart demonstrates extensive multivessel coronary artery calcification. The pericardium demonstrates no acute abnormality. There are extensive atherosclerotic calcifications within the thoracic aorta. There is no acute abnormality of the thoracic aorta. LYMPH NODES: No mediastinal, hilar or axillary lymphadenopathy. LUNGS AND PLEURA: Extensive bilateral asymmetric interstitial and ground-glass pulmonary infiltrate, asymmetrically more severe throughout the right lung, likely infectious or inflammatory in the acute setting. No central obstructing lesion. No pneumothorax or pleural effusion. UPPER ABDOMEN: Cholelithiasis noted within the visualized gallbladder. Limited images of the upper abdomen are otherwise unremarkable. SOFT TISSUES AND BONES: No acute bone or soft tissue abnormality. IMPRESSION: 1. No pulmonary embolism. 2. Extensive bilateral asymmetric interstitial and ground-glass pulmonary infiltrate, more severe throughout the right lung, likely infectious or inflammatory in the acute setting. 3. Extensive multivessel coronary artery calcification. 4. RAF score - Aortic atherosclerosis (ICD10-I70.0). Electronically signed by: Dorethia Molt MD MD 11/04/2024 06:48 PM EST RP Workstation: [id number]   DG Chest 2  View Result Date: 11/01/2024 CLINICAL DATA:  Worsening flu symptoms.  Chest pain. EXAM: DG CHEST 2V COMPARISON:  06/12/2021 FINDINGS: Lungs are adequately inflated demonstrate patchy hazy airspace opacification over the right upper lobe, right base and left mid to lower lung suggesting multifocal pneumonia. No effusion. Cardiomediastinal silhouette and remainder of the exam is unchanged. IMPRESSION: Patchy hazy airspace process over the right upper lobe, right base and left mid to lower lung suggesting multifocal pneumonia. Electronically Signed   By: Toribio Agreste M.D.  On: 11/01/2024 11:22    Microbiology: Recent Results (from the past 240 hours)  Blood culture (routine x 2)     Status: None   Collection Time: 11/01/24 12:00 PM   Specimen: BLOOD  Result Value Ref Range Status   Specimen Description BLOOD LEFT ANTECUBITAL  Final   Special Requests   Final    BOTTLES DRAWN AEROBIC AND ANAEROBIC Blood Culture results may not be optimal due to an inadequate volume of blood received in culture bottles   Culture   Final    NO GROWTH 5 DAYS Performed at [facility], [address] [place], [place] [address]    Report Status 11/06/2024 FINAL  Final  Blood culture (routine x 2)     Status: None   Collection Time: 11/01/24 12:05 PM   Specimen: BLOOD  Result Value Ref Range Status   Specimen Description BLOOD LEFT HAND  Final   Special Requests   Final    BOTTLES DRAWN AEROBIC AND ANAEROBIC Blood Culture results may not be optimal due to an inadequate volume of blood received in culture bottles   Culture   Final    NO GROWTH 5 DAYS Performed at [facility], [address] [place], [place] [address]    Report Status 11/06/2024 FINAL  Final  Urine Culture (for pregnant, neutropenic or urologic patients or patients with an indwelling urinary catheter)     Status: None   Collection Time: 11/03/24  2:31 PM   Specimen: Urine, Clean Catch  Result Value Ref Range Status    Specimen Description   Final    URINE, CLEAN CATCH Performed at [facility], [address] [place], [place] [address]    Special Requests   Final    NONE Performed at [facility], [address] [place], [place] [address]    Culture   Final    NO GROWTH Performed at [facility], [address], [place], [place] [address]    Report Status 11/05/2024 FINAL  Final  Expectorated Sputum Assessment w Gram Stain, Rflx to Resp Cult     Status: None   Collection Time: 11/04/24  1:09 PM   Specimen: Sputum  Result Value Ref Range Status   Specimen Description SPU  Final   Special Requests NONE  Final   Sputum evaluation   Final    Sputum specimen not acceptable for testing.  Please recollect.   RESULT CALLED TO, READ BACK BY AND VERIFIED WITH: AIDA BETTY, RN AT [phone] 11/04/24 RAM Performed at [facility], [address] [place], [place] [address]    Report Status 11/04/2024 FINAL  Final  MRSA Next Gen by PCR, Nasal     Status: None   Collection Time: 11/06/24 11:29 AM   Specimen: Nasal Mucosa; Nasal Swab  Result Value Ref Range Status   MRSA by PCR Next Gen NOT DETECTED NOT DETECTED Final    Comment: (NOTE) The GeneXpert MRSA Assay (FDA approved for NASAL specimens only), is one component of a comprehensive MRSA colonization surveillance program. It is not intended to diagnose MRSA infection nor to guide or monitor treatment for MRSA infections. Test performance is not FDA approved in patients less than 9 years old. Performed at [facility], [address] [place], [place] [address]   Expectorated Sputum Assessment w Gram Stain, Rflx to Resp Cult     Status: None   Collection Time: 11/07/24 12:20 PM   Specimen: Sputum  Result  Value Ref Range Status   Specimen Description SPUTUM  Final   Special Requests NONE  Final   Sputum evaluation   Final    THIS SPECIMEN IS ACCEPTABLE FOR SPUTUM CULTURE Performed at [facility],  [address] [place], [place] [address]    Report Status 11/07/2024 FINAL  Final  Culture, Respiratory w Gram Stain     Status: None   Collection Time: 11/07/24 12:20 PM   Specimen: SPU  Result Value Ref Range Status   Specimen Description   Final    SPUTUM Performed at [facility], [address], [place], [place] [address]    Special Requests   Final    NONE Reflexed from [phone] Performed at [facility], [address] [place], [place] [address]    Gram Stain   Final    RARE WBC PRESENT, PREDOMINANTLY PMN RARE YEAST RARE GRAM POSITIVE COCCI    Culture   Final    FEW Normal respiratory flora-no Staph aureus or Pseudomonas seen Performed at [facility], [address], [place], [place] [address]    Report Status 11/10/2024 FINAL  Final     Labs: CBC: Recent Labs  Lab 11/05/24 0616 11/06/24 0823 11/07/24 1023 11/08/24 0511 11/09/24 0444 11/10/24 0521  WBC 20.4* 19.0* 15.9* 11.7* 10.6* 13.0*  NEUTROABS 16.2* 15.7* 12.0* 8.0* 6.5  --   HGB 10.6* 11.5* 12.2 11.3* 11.9* 11.4*  HCT 30.1* 33.0* 34.5* 31.6* 34.4* 32.4*  MCV 83.8 86.4 85.0 84.3 84.9 86.6  PLT 267 271 287 259 271 299   Basic Metabolic Panel: Recent Labs  Lab 11/06/24 0823 11/07/24 1023 11/08/24 0511 11/09/24 0444 11/10/24 0521  NA 134* 131* 127* 130* 132*  K 2.7* 3.7 4.1 4.0 4.3  CL 98 99 97* 94* 97*  CO2 25 21* 22 26 27   GLUCOSE 110* 85 71 93 95  BUN 13 15 19 22 23   CREATININE 0.50 0.50 0.46 0.49 0.49  CALCIUM  8.9 9.1 8.6* 8.9 8.8*  MG  --  2.2  --  2.3 2.4  PHOS  --   --   --  4.0 3.3   Liver Function Tests: No results for input(s): AST, ALT, ALKPHOS, BILITOT, PROT, ALBUMIN in the last 168 hours. No results for input(s): LIPASE, AMYLASE in the last 168 hours. No results for input(s): AMMONIA in the last 168 hours. Cardiac Enzymes: No results for input(s): CKTOTAL, CKMB, CKMBINDEX, TROPONINI in the last 168 hours. BNP (last 3  results) No results for input(s): BNP in the last 8760 hours. CBG: Recent Labs  Lab 11/09/24 0850 11/09/24 1137 11/09/24 1555 11/09/24 2142 11/10/24 0826  GLUCAP 91 118* 90 119* 101*    Time spent: 35 minutes  Signed:  Elvan Sor  [facility] 11/10/2024 12:07 PM      [1]  Allergies Allergen Reactions   Ceftriaxone  Dermatitis   Cephalexin  Dermatitis   Sulfamethoxazole-Trimethoprim Dermatitis    Other Reaction(s): felt shaky while taking   Gramineae Pollens Other (See Comments)    Other Reaction(s): Other (See Comments)   Doxycycline  Nausea Only and Palpitations    Shaky   Hydroxychloroquine Dermatitis   Sulfa Antibiotics Nausea Only    Shaking

## 2024-11-10 NOTE — Progress Notes (Signed)
 Occupational Therapy Treatment Patient Details Name: Cheryl [name] MRN: [id number] DOB: Oct 15, 1946 Today's Date: 11/10/2024   History of present illness Pt is a 79 yo female that presented to the ED for fatigue, SOB, generalized body ache, positive for flu a few days ago but felt she was worsening. PMH of HTN, HLD, depression, DJD, RA.   OT comments  Pt making gradual progress towards goals. Requires extra encouragement and motivation to participate in any activities, sister present and helped convince pt to mobilize. Pt found to be incontinent of stool upon arrival, TOTAL A for bed level pericare though when motivated, pt is able to roll L<>R to assist. Demonstrated ability to perform STS without AD spontaneously, incontinent of second BM, pericare performed standing with pt provided RW for additional UE support. Requires constant encouragement to avoid spontaneously returning to bed throughout. CGA for step pivot to recliner, SpO2 97% on 1L O2. Will continue to benefit from acute OT services.       If plan is discharge home, recommend the following:  A little help with walking and/or transfers;A little help with bathing/dressing/bathroom;Assistance with cooking/housework;Supervision due to cognitive status;Help with stairs or ramp for entrance   Equipment Recommendations  BSC/3in1;Hospital bed    Recommendations for Other Services      Precautions / Restrictions Precautions Precautions: Fall Recall of Precautions/Restrictions: Impaired Restrictions Weight Bearing Restrictions Per Provider Order: No       Mobility Bed Mobility Overal bed mobility: Needs Assistance Bed Mobility: Supine to Sit     Supine to sit: Supervision, HOB elevated, Used rails          Transfers Overall transfer level: Needs assistance Equipment used: Rolling walker (2 wheels) Transfers: Sit to/from Stand Sit to Stand: Contact guard assist           General transfer comment: when motivated, pt  demonstrated ability to spontaneously stand from EOB without AD use and no physical assist. CGA + RW for step pivot to recliner.     Balance Overall balance assessment: Needs assistance Sitting-balance support: Feet supported Sitting balance-Leahy Scale: Good     Standing balance support: No upper extremity supported, During functional activity Standing balance-Leahy Scale: Good                             ADL either performed or assessed with clinical judgement   ADL Overall ADL's : Needs assistance/impaired                             Toileting- Clothing Manipulation and Hygiene: Bed level;Total assistance Toileting - Clothing Manipulation Details (indicate cue type and reason): pt found incontinent of stool, TOTAL A for bed level pericare. Pt is incontinent and wears briefs at baseline, told family they could bring briefs at home.     Functional mobility during ADLs: Supervision/safety;Cueing for safety;Cueing for sequencing;Rolling walker (2 wheels) General ADL Comments: Pt demonstrates physical ability to perform tasks at supervision/CGA level. Pt quickly is able to return spontaneously to lying back down when motivated, stood from a seated position without direct physical assist. Pt self-limiting mobility and participation.     Communication Communication Communication: No apparent difficulties   Cognition Arousal: Alert Behavior During Therapy: Flat affect Cognition: Cognition impaired   Orientation impairments: Situation Awareness: Intellectual awareness impaired Memory impairment (select all impairments): Short-term memory Attention impairment (select first level of impairment): Focused attention Executive functioning  impairment (select all impairments): Reasoning, Problem solving OT - Cognition Comments: Pt self-limiting participation. Pt requires max encourgement with sister vital to participation in any mobility. Pt often self-selecting to lie  back down at foot of bed and close eyes.                 Following commands: Impaired Following commands impaired: Follows one step commands inconsistently (when pt is motivated, she will follow directions. sister helpful in encourgement)      Cueing   Cueing Techniques: Verbal cues, Gestural cues        General Comments Sister in room throughout and providing encourgement. SpO2 97% on 1L O2, pt doffs Levan end of session. RN notified.    Pertinent Vitals/ Pain       Pain Assessment Pain Assessment: Faces Faces Pain Scale: No hurt   Frequency  Min 2X/week        Progress Toward Goals  OT Goals(current goals can now be found in the care plan section)  Progress towards OT goals: Progressing toward goals  Acute Rehab OT Goals OT Goal Formulation: With patient Time For Goal Achievement: 11/17/24 Potential to Achieve Goals: Good ADL Goals Pt Will Perform Grooming: with supervision;standing Pt Will Perform Lower Body Dressing: with supervision;sit to/from stand Pt Will Transfer to Toilet: with supervision;ambulating;bedside commode  Plan         AM-PAC OT 6 Clicks Daily Activity     Outcome Measure   Help from another person eating meals?: None Help from another person taking care of personal grooming?: None Help from another person toileting, which includes using toliet, bedpan, or urinal?: A Little Help from another person bathing (including washing, rinsing, drying)?: A Little Help from another person to put on and taking off regular upper body clothing?: None Help from another person to put on and taking off regular lower body clothing?: A Little 6 Click Score: 21    End of Session Equipment Utilized During Treatment: Oxygen  OT Visit Diagnosis: Unsteadiness on feet (R26.81);Muscle weakness (generalized) (M62.81)   Activity Tolerance Patient tolerated treatment well   Patient Left in chair;with call bell/phone within reach;with chair alarm set;with  family/visitor present   Nurse Communication Mobility status        Time: 8981-8954 OT Time Calculation (min): 27 min  Charges: OT General Charges $OT Visit: 1 Visit OT Treatments $Self Care/Home Management : 23-37 mins  Sayaka Hoeppner L. Darshay Deupree, OTR/L  11/10/24, 11:52 AM

## 2024-11-10 NOTE — Plan of Care (Signed)

## 2024-11-10 NOTE — TOC Progression Note (Signed)
 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  Pneumocystis PCR pending   Plan to dc to home with max HH services (PT/OT/RN/Aide/SW) and Home O2.   Expected Discharge Plan: Home/Self Care Barriers to Discharge: Continued Medical Work up               Expected Discharge Plan and Services   Discharge Planning Services: CM Consult                                           Social Drivers of Health (SDOH) Interventions SDOH Screenings   Food Insecurity: No Food Insecurity (11/01/2024)  Housing: Low Risk (11/01/2024)  Transportation Needs: No Transportation Needs (11/01/2024)  Utilities: Not At Risk (11/01/2024)  Financial Resource Strain: Low Risk  (05/10/2024)   Received from [facility]  Social Connections: Unknown (11/01/2024)  Tobacco Use: High Risk (11/01/2024)    Readmission Risk Interventions     No data to display

## 2024-11-10 NOTE — Progress Notes (Signed)
..  3 in 1 [facility]   The patient requires the use of a 3 in 1 BSC due to significant mobility and safety limitations. The patient is unable to ambulate safely to the bathroom because of advanced age, weakness, and fall risk. Transferring on and off a standard toilet presents a hazard, as the patient demonstrated decreased balance, endurance and strength.   SABRA.Hospital Bed  The patient requires a hospital bed in the home due to significant limitations with mobility and the need for frequent position changes that can not be safely or effectively achieved with a standard bed. Without this equipment the patient is at increased risk for complications related to immobility and impaired breathing.

## 2024-11-10 NOTE — TOC Transition Note (Signed)
 Transition of Care [facility]) - Discharge Note   Patient Details  Name: Cheryl [name] MRN: [id number] Date of Birth: [birth year]  [facility]) CM/SW Contact:  Dalia GORMAN Fuse, RN Phone Number: 11/10/2024, 2:11 PM   Clinical Narrative:    Patient is medically clear to discharge to home with her daughters and [facility] PT/OT/RN/Aide/SW and [employer] [facility] Hospital Bed and 3 in1 BSC. Home O2 is no longer needed.  No other TOC needs identified.   Final next level of care: Home w Home Health Services Barriers to Discharge: Barriers Resolved   Patient Goals and CMS Choice Patient states their goals for this hospitalization and ongoing recovery are:: to return to her daughter's home          Discharge Placement                       Discharge Plan and Services Additional resources added to the After Visit Summary for     Discharge Planning Services: CM Consult            DME Arranged: Bedside commode, Hospital bed DME Agency: [employer] Date DME Agency Contacted: 11/10/24 Time DME Agency Contacted: [id number] Representative spoke with at DME Agency: [facility] HH Arranged: PT, OT, RN, Nurse's Aide, Social Work [facility] Agency: [facility] Date HH Agency Contacted: 11/10/24 Time HH Agency Contacted: [phone] Representative spoke with at [facility] Agency: [facility]  Social Drivers of Health (SDOH) Interventions SDOH Screenings   Food Insecurity: No Food Insecurity (11/01/2024)  Housing: Low Risk (11/01/2024)  Transportation Needs: No Transportation Needs (11/01/2024)  Utilities: Not At Risk (11/01/2024)  Financial Resource Strain: Low Risk  (05/10/2024)   Received from [facility]  Social Connections: Unknown (11/01/2024)  Tobacco Use: High Risk (11/01/2024)     Readmission Risk Interventions     No data to display

## 2024-11-11 LAB — PNEUMOCYSTIS PCR: Result Pneumocystis PCR: NEGATIVE
